# Patient Record
Sex: Female | Born: 1960 | Race: Black or African American | Hispanic: No | Marital: Married
Health system: Midwestern US, Community
[De-identification: ages and names within clinical notes are randomized; demographics above are authoritative.]

## PROBLEM LIST (undated history)

## (undated) DIAGNOSIS — K219 Gastro-esophageal reflux disease without esophagitis: Secondary | ICD-10-CM

## (undated) DIAGNOSIS — E119 Type 2 diabetes mellitus without complications: Secondary | ICD-10-CM

## (undated) DIAGNOSIS — J45909 Unspecified asthma, uncomplicated: Secondary | ICD-10-CM

## (undated) DIAGNOSIS — M1990 Unspecified osteoarthritis, unspecified site: Secondary | ICD-10-CM

## (undated) DIAGNOSIS — E669 Obesity, unspecified: Secondary | ICD-10-CM

## (undated) DIAGNOSIS — E7800 Pure hypercholesterolemia, unspecified: Secondary | ICD-10-CM

## (undated) DIAGNOSIS — J302 Other seasonal allergic rhinitis: Secondary | ICD-10-CM

## (undated) DIAGNOSIS — G4730 Sleep apnea, unspecified: Secondary | ICD-10-CM

## (undated) DIAGNOSIS — I10 Essential (primary) hypertension: Secondary | ICD-10-CM

## (undated) DIAGNOSIS — Z1231 Encounter for screening mammogram for malignant neoplasm of breast: Secondary | ICD-10-CM

## (undated) HISTORY — DX: Pure hypercholesterolemia, unspecified: E78.00

## (undated) HISTORY — DX: Obesity, unspecified: E66.9

## (undated) HISTORY — DX: Unspecified osteoarthritis, unspecified site: M19.90

## (undated) HISTORY — DX: Type 2 diabetes mellitus without complications: E11.9

## (undated) HISTORY — DX: Gastro-esophageal reflux disease without esophagitis: K21.9

## (undated) HISTORY — DX: Unspecified asthma, uncomplicated: J45.909

## (undated) HISTORY — PX: BACK SURGERY: SHX140

## (undated) HISTORY — PX: ABDOMINAL HYSTERECTOMY: SHX81

## (undated) HISTORY — DX: Essential (primary) hypertension: I10

## (undated) HISTORY — PX: COLONOSCOPY: SHX174

---

## 1997-06-25 ENCOUNTER — Ambulatory Visit (HOSPITAL_COMMUNITY): Admission: RE | Admit: 1997-06-25 | Discharge: 1997-06-25 | Payer: Self-pay | Admitting: *Deleted

## 1997-08-05 ENCOUNTER — Ambulatory Visit (HOSPITAL_COMMUNITY): Admission: RE | Admit: 1997-08-05 | Discharge: 1997-08-05 | Payer: Self-pay | Admitting: *Deleted

## 1998-01-17 ENCOUNTER — Emergency Department (HOSPITAL_COMMUNITY): Admission: EM | Admit: 1998-01-17 | Discharge: 1998-01-17 | Payer: Self-pay | Admitting: Emergency Medicine

## 1998-11-26 ENCOUNTER — Encounter: Payer: Self-pay | Admitting: Specialist

## 1998-11-26 ENCOUNTER — Ambulatory Visit (HOSPITAL_COMMUNITY): Admission: RE | Admit: 1998-11-26 | Discharge: 1998-11-26 | Payer: Self-pay | Admitting: Specialist

## 1998-12-17 ENCOUNTER — Ambulatory Visit (HOSPITAL_COMMUNITY): Admission: RE | Admit: 1998-12-17 | Discharge: 1998-12-17 | Payer: Self-pay | Admitting: Specialist

## 1998-12-17 ENCOUNTER — Encounter: Payer: Self-pay | Admitting: Specialist

## 1999-05-25 ENCOUNTER — Other Ambulatory Visit: Admission: RE | Admit: 1999-05-25 | Discharge: 1999-05-25 | Payer: Self-pay | Admitting: Obstetrics and Gynecology

## 1999-06-02 ENCOUNTER — Ambulatory Visit (HOSPITAL_COMMUNITY): Admission: AD | Admit: 1999-06-02 | Discharge: 1999-06-02 | Payer: Self-pay | Admitting: *Deleted

## 2000-07-02 NOTE — Discharge Summary (Signed)
[facility]                       OUTPATIENT CENTER DISCHARGE SUMMARY   NAME:[name], [name]   MR#:  [id number]     BILLING #: [id number]     DOS: 07/02/2000  DOD:07/02/2000   TIME:  5:54 P   cc:   Primary Physician:  Gustavus Bryant, M.D.   TIME OF ADMISSION:   0845 hours.   TIME OF DISCHARGE:  1800 hours.   ADMISSION DIAGNOSES:  Chest Pain, Hypertension   DISCHARGE DIAGNOSES:  Chest Pain, cardiac unlikely;  Hypertension   HISTORY:  A 40 year old woman seen in the Emergency Department with a   couple of weeks of chest pain and a recent diagnosis of hypertension.  The   Emergency Department evaluation was unrevealing and the patient was   assigned to the outpatient center for serial cardiac enzymes and stress   echocardiography.   COURSE IN THE OUTPATIENT CENTER:  Had one episode of chest pain without   unchanged electrocardiogram. Vital signs remained stable and unremarkable.   She was generally pain free.   The patient was assigned to the Outpatient Center for serial cardiac   enzymes and resting and stress echocardiography.  Results of serum   myoglobin at 0 and 3 hours, and serum CPK and MB at 0, 6, and 9 hours gave   no indication of acute myocardial damage.  The patient then underwent   resting and exercise 2-D echocardiography and exercise test under the   supervision of [facility].  Their final impression was of   "No CAD, OK for Discharge."   PHYSICAL EXAMINATION:   LUNGS:  Clear.   CARDIOVASCULAR:   Heart is normal.   ABDOMEN:  Nontender.  The patient is asymptomatic.   DISPOSITION:   The probabilistic nature of cardiac diagnostic testing was   explained.  The patient was counseled to seek further cardiac evaluation   should symptoms worsen or persist without another diagnosis being found.   Urged to follow with private physician for continued evaluation and   treatment.   Electronically Signed By:   Shanna Cisco, M.D. 07/10/2000 01:45    ____________________________   Shanna Cisco, M.D.   dh D:  07/02/2000 T:  07/03/2000  2:57 P   [id number]

## 2000-07-02 NOTE — Procedures (Signed)
[facility]                          STRESS ECHOCARDIOGRAM REPORT   NAME:    [name] [name], [name] [name]                                SS#:        [id number]   65   DOB:     03/16/1961                                   AGE:        39   SEX:     F                                            ROOM#:      [id number]   MR#:     [id number]                                     DATE:       02/11/200   2   REFERRING PHYS:L. SiegelTAPE/INDEX:                   [id number]   PRETEST DATA:   INDICATION:  Chest pain   MEDS TAKEN:  --   MEDS HELD:  Nifedipine, Claritin D   TARGET HEART RATE:  180     85%:  153   RISK FACTORS:  Hypertension   BASELINE ECG:  Normal sinus rhythm; within normal limits   EXERCISE SUPERVISED BY:  Charles Ashby, Jr, M.D.   TEST RESULTS:           BRUCE PROTOCOL     STAGE    SPEED (MPH)   GRADE (%)   TIME (MIN:SEC)      HR       BP   Resting                                                  92     112/82       1          1.7           10           3:00          133     148/80       2          2.5           12           3:00          155     160/100       3          3.4           14           1:00          173     ---/--     Recovery                                Immediate        --     ---/--                                             2:00          107     140/80                                             4:00           99     132/80                                             6:00           92     134/84   REASON FOR STOPPING:  Achieved target HR        TOTAL EXERCISE TIME:  7:00   ACHIEVED HEART RATE:  173 (%  max HR)           EST. METS:  8   HR RESPONSE:  Tachycardic                       PEAK RPP:  8   BP RESPONSE:  Hypertensive                      CHEST PAIN:  None   OBSERVED DYSRHYTHMIAS:  None   ST SEGMENT CHANGES:  J point depression with insignificant rapidly   upsloping ST segments                               WALL MOTION ANALYSIS    LV WALL SEGMENT              PRE-EXERCISE            POST-EXERCISE   Basal Anteroseptal              Normal                Hyperkinesis   Basal Septal                    Normal                Hyperkinesis   Basal Inferoseptal              Normal                Hyperkinesis   Basal Posterior                 Normal                Hyperkinesis   Basal Lateral                   Normal                  Hyperkinesis   Basal Anterior                  Normal                Hyperkinesis   Mid Anteroseptal                Normal                Hyperkinesis   Mid Septal                      Normal                Hyperkinesis   Mid Inferoseptal                Normal                Hyperkinesis   Mid Posterior                   Normal                Hyperkinesis   Mid Lateral                     Normal                Hyperkinesis   Mid Anterior                    Normal                Hyperkinesis   Apical Septal                   Normal                Hyperkinesis   Apical Inferior                 Normal                Hyperkinesis   Apical Lateral                  Normal                Hyperkinesis   Apical Anterior                 Normal                Hyperkinesis   LV  Chamber Size                                        Smaller   ECG INTERPRETATION:  Negative by ECG criteria.   ECHO INTERPRETATION:  Normal.   OVERALL IMPRESSION:  Negative for ischemia.

## 2000-07-02 NOTE — Procedures (Signed)
[facility]                          STRESS ECHOCARDIOGRAM REPORT   NAME:    [name] [name], [name] [name]                                SS#:        [id number]   64   DOB:     1960/08/12                                   AGE:        39   SEX:     F                                            ROOM#:      [id number]   MR#:     [id number]                                     DATE:       02/11/200   2   REFERRING PHYS:L. SiegelTAPE/INDEX:                   [id number]   PRETEST DATA:   INDICATION:  Chest pain   MEDS TAKEN:  --   MEDS HELD:  Nifedipine, Claritin D   TARGET HEART RATE:  180     85%:  153   RISK FACTORS:  Hypertension   BASELINE ECG:  Normal sinus rhythm; within normal limits   EXERCISE SUPERVISED BY:  Marisue Ivan, Montez Hageman, M.D.   TEST RESULTS:           BRUCE PROTOCOL     STAGE    SPEED (MPH)   GRADE (%)   TIME (MIN:SEC)      HR       BP   Resting                                                  92     112/82       1          1.7           10           3:00          133     148/80       2          2.5           12           3:00          155     160/100       3          3.4           14           1:00          173     ---/--  Recovery                                Immediate        --     ---/--                                             2:00          107     140/80                                             4:00           99     132/80                                             6:00           92     134/84   REASON FOR STOPPING:  Achieved target HR        TOTAL EXERCISE TIME:  7:00   ACHIEVED HEART RATE:  173 (%  max HR)           EST. METS:  8   HR RESPONSE:  Tachycardic                       PEAK RPP:  8   BP RESPONSE:  Hypertensive                      CHEST PAIN:  None   OBSERVED DYSRHYTHMIAS:  None   ST SEGMENT CHANGES:  J point depression with insignificant rapidly   upsloping ST segments                               WALL MOTION ANALYSIS    LV WALL SEGMENT              PRE-EXERCISE            POST-EXERCISE   Basal Anteroseptal              Normal                Hyperkinesis   Basal Septal                    Normal                Hyperkinesis   Basal Inferoseptal              Normal                Hyperkinesis   Basal Posterior                 Normal                Hyperkinesis   Basal Lateral                   Normal  Hyperkinesis   Basal Anterior                  Normal                Hyperkinesis   Mid Anteroseptal                Normal                Hyperkinesis   Mid Septal                      Normal                Hyperkinesis   Mid Inferoseptal                Normal                Hyperkinesis   Mid Posterior                   Normal                Hyperkinesis   Mid Lateral                     Normal                Hyperkinesis   Mid Anterior                    Normal                Hyperkinesis   Apical Septal                   Normal                Hyperkinesis   Apical Inferior                 Normal                Hyperkinesis   Apical Lateral                  Normal                Hyperkinesis   Apical Anterior                 Normal                Hyperkinesis   LV  Chamber Size                                        Smaller   ECG INTERPRETATION:  Negative by ECG criteria.   ECHO INTERPRETATION:  Normal.   OVERALL IMPRESSION:  Negative for ischemia.

## 2000-07-02 NOTE — ED Provider Notes (Signed)
[facility]                      EMERGENCY DEPARTMENT TREATMENT REPORT   [facility] ASSIGNMENT   NAME:  [name] [name], [name] [name]   MR #:  [id number]   BILLING #: [id number]        DOS: 07/02/2000  TIME: 6:08 A   cc:   [facility] COPY   Primary Physician:  Pricilla Holm, M.D.   Time Seen:  50.   CHIEF COMPLAINT:   Chest pain.   HISTORY OF PRESENT ILLNESS: This 40 year old female awoke at 0430 today   with 5/10 chest pressure and discomfort which was in the sternal area,   feels like "somebody is pulling on my chest" associated with some nausea   and diaphoresis.  She has been having these symptoms off and on for the   last two weeks and was diagnosed four days ago with hypertension.  Three   days ago, she began nifedipine therapy for her hypertension.  Additionally,   for the last two months, she has had intermittent tingling in her left arm   and fingers which she has again this morning.  She has never had an EKG or   cardiac workup.  There are no aggravating or alleviating factors.  No   shortness of breath.  No other complaints.   REVIEW OF SYSTEMS:   CONSTITUTIONAL:   No fevers, chills, or recent illnesses.   EYES: No visual symptoms.   ENT: No sore throat, runny nose or other URI symptoms.   ENDOCRINE:  No diabetic symptoms.   HEMATOLOGIC/LYMPHATIC:  No excessive bruising or lymph node swelling.   ALLERGIC/IMMUNOLOGIC:  No urticaria or allergy symptoms.   RESPIRATORY:  No cough, shortness of breath, or wheezing.   CARDIOVASCULAR:  No palpitations.   GASTROINTESTINAL:  No vomiting, diarrhea, or abdominal pain.   GENITOURINARY:  No dysuria, frequency, or urgency.   MUSCULOSKELETAL:  No joint pain or swelling.   INTEGUMENTARY:  No rashes.   NEUROLOGICAL:  Dull generalized headache.   PAST MEDICAL HISTORY:   Hypertension, newly diagnosed.   MEDICATIONS:  Nifedipine, Claritin D, Vioxx p.r.n.   ALLERGIES:   Darvocet and penicillin.   SOCIAL HISTORY:  Married.  Stopped smoking one year ago.  Prior to that,    she smoked only a few cigarettes a week.   FAMILY HISTORY:  Both parents have a history of hypertension.  No family   history of early cardiac disease or diabetes.   Cardiac risk factors: No   history of hypertriglyceridemia or diabetes.   PHYSICAL EXAMINATION:   CONSTITUTIONAL:   Alert, 40 year old female.   VITAL SIGNS:  On admission, blood pressure 137/90, pulse 89, respirations   20, temperature 98.7.   EYES: Conjunctiva clear.   ENT:  Mouth/Throat:  Mucous membranes moist.   NECK:  Supple, nontender, symmetrical, no masses or JVD, trachea midline,   thyroid not enlarged, nodular, or tender.   LYMPHATICS:  No cervical or submandibular lymphadenopathy palpated.   RESPIRATORY:  Clear and equal BS.  No respiratory distress, tachypnea, or   accessory muscle use. Chest percussion normal.   CARDIOVASCULAR:  Heart regular, without murmurs, gallops, rubs, or thrills.   PMI not displaced. Aortic pulsation not widened.  No bruits auscultated. DP   pulses 2+ and equal bilaterally. No peripheral edema or significant   varicosities.   CHEST:  Symmetrical.  Sternum is tender to palpation which reproduces her   pain.   GI:  Abdomen soft, nontender, without complaint of pain to palpation.  No   hepatomegaly or splenomegaly. No abdominal or inguinal masses appreciated   by inspection or palpation.   MUSCULOSKELETAL:  Nails:  No clubbing or deformities.  Nailbeds pink with   prompt capillary refill.   SKIN:  Warm and dry without rashes.   NEUROLOGICAL:   No focal deficits.   PSYCHIATRIC:   Oriented to person, place and time.   CONTINUATION BY DR. Henrene Hawking:   IMPRESSION/MANAGEMENT PLAN:  Patient with chest pain.  Acute ischemic   coronary disease must be considered first, and the patient protected   against the consequences of same, while other etiologies (including   infectious, metabolic, pulmonary, GI, and musculoskeletal) are considered.   The patient with recent diagnosis of hypertension.  As an acute illness    posing a potential threat to life or bodily function, this is a high risk   presentation necessitating an immediate diagnostic evaluation.   DIAGNOSTIC TESTING:  BMP is normal.  CBC with WBC of 5.1, hemoglobin 13,   hematocrit of 40, platelets 326,000.  EKG: Sinus rhythm, nonspecific ST   changes.  Chest x-ray negative acute changes by my reading.   COURSE IN THE EMERGENCY DEPARTMENT:  On reevaluation, the patient denies   further chest discomfort, but she has had episodic pain over the past two   weeks.   DISPOSITION:  The initial Emergency Department evaluation of this patient   appears to be negative for evidence of an acute coronary ischemia requiring   hospital admission or urgent intervention.  However, ischemic coronary   disease has not been eliminated as a consideration.  Consequently, the   patient will be assigned to the Outpatient Center for serial cardiac   enzymes and, if these are negative, resting and stress echocardiography or   other additional diagnostic testing.   CONDITION ON DISCHARGE: Stable.   FINAL DIAGNOSIS:   1.  Acute chest pain evaluation.   2.  History of hypertension.   Electronically Signed By:   Haze Justin, M.D. 07/04/2000 21:08   ____________________________   Haze Justin, M.D.   zga/zga  D:  07/02/2000 T:  07/02/2000  6:54 A   [id number]   Dineen Kid, PA

## 2000-07-02 NOTE — Procedures (Signed)
[facility]                          STRESS ECHOCARDIOGRAM REPORT   NAME:    [name] [name], [name] [name]                                SS#:        [id number]   64   DOB:     12/19/1960                                   AGE:        39   SEX:     F                                            ROOM#:      [id number]   MR#:     [id number]                                     DATE:       02/11/200   2   REFERRING PHYS:L. SiegelTAPE/INDEX:                   [id number]   PRETEST DATA:   INDICATION:  Chest pain   MEDS TAKEN:  --   MEDS HELD:  Nifedipine, Claritin D   TARGET HEART RATE:  180     85%:  153   RISK FACTORS:  Hypertension   BASELINE ECG:  Normal sinus rhythm; within normal limits   EXERCISE SUPERVISED BY:  Charles Ashby, Jr, M.D.   TEST RESULTS:           BRUCE PROTOCOL     STAGE    SPEED (MPH)   GRADE (%)   TIME (MIN:SEC)      HR       BP   Resting                                                  92     112/82       1          1.7           10           3:00          133     148/80       2          2.5           12           3:00          155     160/100       3          3.4           14           1:00          173     ---/--     Recovery                                Immediate        --     ---/--                                             2:00          107     140/80                                             4:00           99     132/80                                             6:00           92     134/84   REASON FOR STOPPING:  Achieved target HR        TOTAL EXERCISE TIME:  7:00   ACHIEVED HEART RATE:  173 (%  max HR)           EST. METS:  8   HR RESPONSE:  Tachycardic                       PEAK RPP:  8   BP RESPONSE:  Hypertensive                      CHEST PAIN:  None   OBSERVED DYSRHYTHMIAS:  None   ST SEGMENT CHANGES:  J point depression with insignificant rapidly   upsloping ST segments                               WALL MOTION ANALYSIS   LV WALL SEGMENT              PRE-EXERCISE             POST-EXERCISE   Basal Anteroseptal              Normal                Hyperkinesis   Basal Septal                    Normal                Hyperkinesis   Basal Inferoseptal              Normal                Hyperkinesis   Basal Posterior                 Normal                Hyperkinesis   Basal Lateral                   Normal                  Hyperkinesis   Basal Anterior                  Normal                Hyperkinesis   Mid Anteroseptal                Normal                Hyperkinesis   Mid Septal                      Normal                Hyperkinesis   Mid Inferoseptal                Normal                Hyperkinesis   Mid Posterior                   Normal                Hyperkinesis   Mid Lateral                     Normal                Hyperkinesis   Mid Anterior                    Normal                Hyperkinesis   Apical Septal                   Normal                Hyperkinesis   Apical Inferior                 Normal                Hyperkinesis   Apical Lateral                  Normal                Hyperkinesis   Apical Anterior                 Normal                Hyperkinesis   LV  Chamber Size                                        Smaller   ECG INTERPRETATION:  Negative by ECG criteria.   ECHO INTERPRETATION:  Normal.   OVERALL IMPRESSION:  Negative for ischemia.

## 2000-07-02 NOTE — Procedures (Signed)
[facility]                          STRESS ECHOCARDIOGRAM REPORT   NAME:    [name] [name], [name] [name]                                SS#:        [id number]   64   DOB:     [birth year]                                   AGE:        39   SEX:     F                                            ROOM#:      [id number]   MR#:     [id number]                                     DATE:       02/11/200   2   REFERRING PHYS:L. SiegelTAPE/INDEX:                   [id number]   PRETEST DATA:   INDICATION:  Chest pain   MEDS TAKEN:  --   MEDS HELD:  Nifedipine, Claritin D   TARGET HEART RATE:  180     85%:  153   RISK FACTORS:  Hypertension   BASELINE ECG:  Normal sinus rhythm; within normal limits   EXERCISE SUPERVISED BY:  Marisue Ivan, Montez Hageman, M.D.   TEST RESULTS:           BRUCE PROTOCOL     STAGE    SPEED (MPH)   GRADE (%)   TIME (MIN:SEC)      HR       BP   Resting                                                  92     112/82       1          1.7           10           3:00          133     148/80       2          2.5           12           3:00          155     160/100       3          3.4           14           1:00          173     ---/--  Recovery                                Immediate        --     ---/--                                             2:00          107     140/80                                             4:00           99     132/80                                             6:00           92     134/84   REASON FOR STOPPING:  Achieved target HR        TOTAL EXERCISE TIME:  7:00   ACHIEVED HEART RATE:  173 (%  max HR)           EST. METS:  8   HR RESPONSE:  Tachycardic                       PEAK RPP:  8   BP RESPONSE:  Hypertensive                      CHEST PAIN:  None   OBSERVED DYSRHYTHMIAS:  None   ST SEGMENT CHANGES:  J point depression with insignificant rapidly   upsloping ST segments                               WALL MOTION ANALYSIS   LV WALL SEGMENT              PRE-EXERCISE             POST-EXERCISE   Basal Anteroseptal              Normal                Hyperkinesis   Basal Septal                    Normal                Hyperkinesis   Basal Inferoseptal              Normal                Hyperkinesis   Basal Posterior                 Normal                Hyperkinesis   Basal Lateral                   Normal  Hyperkinesis   Basal Anterior                  Normal                Hyperkinesis   Mid Anteroseptal                Normal                Hyperkinesis   Mid Septal                      Normal                Hyperkinesis   Mid Inferoseptal                Normal                Hyperkinesis   Mid Posterior                   Normal                Hyperkinesis   Mid Lateral                     Normal                Hyperkinesis   Mid Anterior                    Normal                Hyperkinesis   Apical Septal                   Normal                Hyperkinesis   Apical Inferior                 Normal                Hyperkinesis   Apical Lateral                  Normal                Hyperkinesis   Apical Anterior                 Normal                Hyperkinesis   LV  Chamber Size                                        Smaller   ECG INTERPRETATION:  Negative by ECG criteria.   ECHO INTERPRETATION:  Normal.   OVERALL IMPRESSION:  Negative for ischemia.

## 2001-08-22 NOTE — Progress Notes (Signed)
[facility]                       PHYSICAL THERAPY INITIAL EVALUATION   PATIENT NAME:  [name] [name], [name] [name]   MR#:  [id number]   DATE:  08/21/2001   REFERRING PHYSICIAN:  Blenda Bridegroom, M.D.   DIAGNOSIS:  Low back pain with lumbar spasms.   PRESCRIPTION: Evaluate and treat.   SUBJECTIVE:   HISTORY OF PRESENT ILLNESS: The patient is a 41 year old female referred to   [facility] outpatient physical therapy department with   complaints of low back pain and spasms right side greater than left.  The   patient reports that she fell in October 2002 and has had intermittent back   pain since that time. She also reports falling in January of this year   causing the pain and the spasms to become more consistent.  She reports   that she is able to walk, but with prolonged walking and certain physical   activities spasms are reproduced. She states that heat and ice tend to help   decrease the pain. She denies any lower extremity radicular symptoms.  She   is to follow up with Dr. Emmit Alexanders as needed.   PAIN SCALE RATING: Patient currently rates his pain 2-3/10 on a pain   assessment scale.   MEDICATIONS: Toprol and hydrochlorothiazide.   ALLERGIES:  None were reported.   PRECAUTIONS:  None were stated on the prescription.   PAST MEDICAL HISTORY: The patient has a history of high blood pressure and   a lumbar discectomy in 1997.   X-RAYS/TESTS: X-rays were done of the lumbar spine which she is unsure of   the results.   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   PREVIOUS PHYSICAL THERAPY: The patient did undergo physical therapy prior   to her surgery in 1997.   PATIENT'S GOALS: To increase her strength to allow her to increase her   activity level.   OBJECTIVE:   RANGE OF MOTION: The patient is able to demonstrate full trunk flexion and    extension. Side bending to the left and right was limited by approximately   25%.  Restrictions were noted at L3 with side bending.   STRENGTH:  Trunk musculature was grossly 4/5.  Bilateral lower extremities   5/5.   GIRTH:  No lower extremity edema was noted.   PALPATION:  Tenderness present through the lumbar spinal musculature rated   1/4 on Reeves tender point index.   POSTURE: No deviations were observed.   SENSATION:  Intact to light touch distal lower extremity dermatomes.   GAIT:  No deviations were observed.   SPECIAL TESTS: None were performed.   TODAY'S TREATMENT: Evaluation followed by therapeutic exercise and   initiation of a home exercise program.   ASSESSMENT:   The patient is a 41 year old female with low back pain slightly limiting   range of motion and functional activities.   GOALS:  To be achieved in 8 visits.   1.    Decrease low back pain to 0/10 on a pain assessment scale at rest and         with activity.   2.    The patient is able to increase her activity level and exercise         tolerance without resulting lumbar spasms.   3.  The patient is able to demonstrate full, pain free trunk active range         of motion with complete segmental mobility.   4.    Absent palpable tenderness throughout the lumbar spinal musculature.   5.    The patient is to be independent with her home exercise program.   6.    The patient is able to increase her trunk and abdominal strength         approximately 1 manual muscle test grade to allow her to perform her         exercises and activities without limitations.   REHAB POTENTIAL: Excellent to meet the above stated goals.   BARRIERS TO ACHIEVING GOALS: None.   PLAN:   Physical therapy 2 times a week for 4 weeks to include the following   treatment of lumbar and abdominal stabilization exercises, moist heat or   ice and e-stim as needed, teaching of a home exercise program as well as   postural and body mechanics training.    Thank you Dr. Emmit Alexanders for this referral.   [facility], MSPT   sl  D:  08/22/2001  T:  08/26/2001  8:33 A   [id number]   cc:

## 2006-11-27 DIAGNOSIS — N6019 Diffuse cystic mastopathy of unspecified breast: Secondary | ICD-10-CM | POA: Insufficient documentation

## 2006-11-27 DIAGNOSIS — R1319 Other dysphagia: Secondary | ICD-10-CM | POA: Insufficient documentation

## 2007-08-26 DIAGNOSIS — R7301 Impaired fasting glucose: Secondary | ICD-10-CM | POA: Insufficient documentation

## 2007-11-11 NOTE — Procedures (Signed)
Test Reason : pre op   Blood Pressure : ***/*** mmHG   Vent. Rate : 083 BPM     Atrial Rate : 083 BPM   P-R Int : 140 ms          QRS Dur : 066 ms   QT Int : 382 ms       P-R-T Axes : -82 047 026 degrees   QTc Int : 448 ms   Unusual P axis, possible ectopic atrial rhythm   Poor R Wave Progression   Abnormal ECG   When compared with ECG of 02-Jul-2000 11:45,   PREVIOUS ECG IS PRESENT No significant change was found   Confirmed by Manchi, M.D., Partha ([id number]) on 11/11/2007 2:14:12 PM   Referred By:  BLOWE, VANESSA           Overread By: Partha Manchi, M.D.

## 2007-11-11 NOTE — Procedures (Signed)
Test Reason : pre op   Blood Pressure : ***/*** mmHG   Vent. Rate : 083 BPM     Atrial Rate : 083 BPM   P-R Int : 140 ms          QRS Dur : 066 ms   QT Int : 382 ms       P-R-T Axes : -82 047 026 degrees   QTc Int : 448 ms   Unusual P axis, possible ectopic atrial rhythm   Poor R Wave Progression   Abnormal ECG   When compared with ECG of 02-Jul-2000 11:45,   PREVIOUS ECG IS PRESENT No significant change was found   Confirmed by Rainey Pines, M.D., Partha ([id number]) on 11/11/2007 2:14:12 PM   Referred By:  Arlester Marker           Overread By: Lisabeth Devoid, M.D.

## 2007-11-15 NOTE — H&P (Signed)
[facility]                              HISTORY AND PHYSICAL                         LINDA J. MATHISON-EZIEME, M.D.   [name] [name], [name] [name]   MR #:    [id number]                  ADM DATE:          11/18/2007   BILLING  [id number]                 PT. LOCATION   #:   SS #     [id number]               DOB:  [birth year]   AGE:  46   LINDA J. MATHISON-EZIEME, M.D.     SEX:  F   cc:    LINDA J. MATHISON-EZIEME, M.D.   HISTORY OF THE PRESENT ILLNESS   This patient is a 47 year old G2, P0-0-2-0, not on any form of   contraception, who was first seen in January 2009 for an annual exam.  At   that time, she complained of an area of irritation on her vulva.  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   The patient returned for a colposcopic examination in February 2009, and   the biopsy result revealed VIN2.   OBSTETRICAL HISTORY   One elective termination of pregnancy and one miscarriage.   GYNECOLOGIC HISTORY   No abnormal Pap test.  The patient has genital herpes.  Menarche was at age   77.   PAST MEDICAL HISTORY   1. Hypertension.   2. Migraine headaches.   3. Hypothyroidism.   PAST SURGICAL HISTORY   Back surgery in July 1997.   MEDICATIONS   1. Toprol.   2. Hydrochlorothiazide.   3. Potassium supplement.   4. Enbrel.   5. Acyclovir   ALLERGIES:   Darvocet and penicillin.   SOCIAL HISTORY   No alcohol, tobacco or drug use.   FAMILY HISTORY   No uterine, ovarian, breast or colon cancer.   PHYSICAL EXAMINATION   VITAL SIGNS:  Blood pressure 122/86.   GENERAL APPEARANCE:  Normal.   SKIN:  No lesions.   NECK:  Supple.  Thyroid not enlarged.   LUNGS:  Clear.   HEART:  Regular rate and rhythm.  Normal S1-S2.   BREASTS:  Soft, nontender.  No masses.   PELVIC AND RECTAL:  External genitalia normal.  Vagina with old blood.   Cervix nontender.  Uterus not palpable.  Perianal lesion.  Anus with no   lesions.   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   PLAN   The patient will undergo laser ablation of the vulva.  Risks of the   procedure including infection and damage to adjacent structures have been   discussed with the patient.   Electronically Signed By:   Carlena Hurl. MATHISON-EZIEME, M.D. 11/19/2007 22:50   ____________________________   Carlena Hurl. MATHISON-EZIEME, M.D.   [name]  D:  11/15/2007  T:  11/15/2007  2:32 P   [id number]

## 2007-11-18 NOTE — Op Note (Signed)
[facility]                                OPERATION REPORT                    SURGEON:  LINDA J. MATHISON-EZIEME, M.D.   NAM [name] [name], [name]   E:   MR  [id number]                DATE OF SURGERY:                     11/18/2007   #:   SS  [id number]             PT. LOCATION:                        OR  OR30   #   LINDA J. MATHISON-EZIEME,    DOB: 08/03/1960        AGE:46        SEX:  F   M.D.   cc:    LINDA J. MATHISON-EZIEME, M.D.   PREOPERATIVE DIAGNOSIS   Vulvar intraepithelial neoplasia II.   POSTOPERATIVE DIAGNOSIS   Vulvar intraepithelial neoplasia II.   OPERATION PERFORMED   Laser ablation to the vulva.   FINDINGS   Quarter-size area of acetyl white epithelium   SURGEON   Dr. Mathison-Ezieme   ASSISTANT   SA   ANESTHESIA   General   SPECIMENS REMOVED   None   ESTIMATED BLOOD LOSS   Minimal   COMPLICATIONS   None   PROCEDURE   The patient was taken to the operating room, placed in the supine position.   General anesthesia was then administered.  She was then placed in the   dorsal lithotomy position in candy-cane stirrups, prepped for laser   surgery.   Acetic acid was placed on the perineum and in the introitus.  A very large   area of white epithelium was noted.  The area involved was ablated under a   power of 15 with the laser device including the margins and just inside the   vagina to completely ablate the abnormal cells.  Some bleeding was noted   and this was abated with the electrosurgical unit.  Silvadene was then   applied.  The patient tolerated the procedure well.  Sponge, lap and needle   counts correct x2.  She was taken to the PACU in stable condition.   Electronically Signed By:   LINDA J. MATHISON-EZIEME, M.D. 11/19/2007 22:50   ____________________________   LINDA J. MATHISON-EZIEME, M.D.   AK  D:  11/18/2007  T:  11/18/2007  9:08 P  [id number]

## 2007-11-18 NOTE — Op Note (Signed)
[facility]                                OPERATION REPORT                    SURGEON:  LINDA J. [name], M.D.   [facility], [place]   E:   MR  [id number]                DATE OF SURGERY:                     11/18/2007   #:   [name] [name]  [id number]             PT. LOCATION:                        OR  OR30   #   LINDA J. [name],    DOB: 1961-05-09        AGE:47        SEX:  F   M.D.   cc:    LINDA J. [name], M.D.   PREOPERATIVE DIAGNOSIS   Vulvar intraepithelial neoplasia II.   POSTOPERATIVE DIAGNOSIS   Vulvar intraepithelial neoplasia II.   OPERATION PERFORMED   Laser ablation to the vulva.   FINDINGS   Quarter-size area of acetyl white epithelium   SURGEON   Dr. Casper Harrison   ASSISTANT   SA   ANESTHESIA   General   SPECIMENS REMOVED   None   ESTIMATED BLOOD LOSS   Minimal   COMPLICATIONS   None   PROCEDURE   The patient was taken to the operating room, placed in the supine position.   General anesthesia was then administered.  She was then placed in the   dorsal lithotomy position in candy-cane stirrups, prepped for laser   surgery.   Acetic acid was placed on the perineum and in the introitus.  A very large   area of white epithelium was noted.  The area involved was ablated under a   power of 15 with the laser device including the margins and just inside the   vagina to completely ablate the abnormal cells.  Some bleeding was noted   and this was abated with the electrosurgical unit.  Silvadene was then   applied.  The patient tolerated the procedure well.  Sponge, lap and needle   counts correct x2.  She was taken to the PACU in stable condition.   Electronically Signed By:   [name] [name], M.D. 11/19/2007 22:50   ____________________________   [name] [name], M.D.   AK  D:  11/18/2007  T:  11/18/2007  9:08 P  [id number]

## 2008-07-06 NOTE — Procedures (Signed)
Test Reason : PRE OP   Blood Pressure : ***/*** mmHG   Vent. Rate : 083 BPM     Atrial Rate : 083 BPM   P-R Int : 184 ms          QRS Dur : 062 ms   QT Int : 378 ms       P-R-T Axes : -79 065 029 degrees   QTc Int : 444 ms   Unusual P axis, possible ectopic atrial rhythm   Early repolarization   When compared with ECG of 11-Nov-2007 12:16,   No significant change was found   Confirmed by Shah, M.D., Sanjay ([id number]) on 07/07/2008 5:13:37 PM   Referred By:  BLOWE, VANESSA           Overread By: Sanjay Shah, M.D.

## 2008-07-06 NOTE — H&P (Signed)
[facility]                              HISTORY AND PHYSICAL                             [place] W. Garlan Fair, M.D.   NAMEKRISTALYN, BERGSTRESSER   MR #:    [id number]                  ADM DATE:          07/10/2008   BILLING  [id number]                 PT. LOCATION   #:   SS #     [id number]               DOB:  Sep 29, 1960   AGE:  47   THOMAS W. MONTAG, M.D.             SEX:  F   cc:    LINDA J. MATHISON-EZIEME, M.D.          Acie Fredrickson. MONTAG, M.D.   DATE OF SURGERY   07/10/2008   CHIEF COMPLAINT   Recurrent vulvar intraepithelial neoplasia II.   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   This was initially diagnosed with VIN II on vulvar biopsy in February 2009.   On 11/18/2007 she underwent vulvar laser ablation.  New lesions on the   vulva were noted during a recent office visit.  She complains of mild   pruritus and burning.  She is otherwise asymptomatic.   PAST MEDICAL HISTORY   Significant for hypertension, migraine headaches, psoriasis and psoriatic   arthritis.   PAST SURGICAL HISTORY   Disk surgery 1997.   CURRENT MEDICATIONS   1. Toprol XL 25 mg daily.   2. Hydrochlorothiazide 25 mg daily.   3. Kay Ciel daily.   4. Singulair daily.   5. Claritin daily.   6. Premphase daily.   7. Nasonex.   8. Ambien.   9. Tramadol 50 mg q.i.d.   10. Medrol.   ALLERGIES   Penicillin and Darvocet, reacting to both with a rash.   FAMILY HISTORY   Significant for cardiovascular disease, diabetes and hypertension.  Her   brother had a brain tumor.   SOCIAL HISTORY   The patient is married and lives in [place].  She works as a Personnel officer with bipolar patients.   REVIEW OF SYSTEMS   Noncontributory.   PHYSICAL EXAMINATION   HEENT:  Exam grossly normal.   NECK:  Supple with a normal thyroid.    LYMPH NODES:  No supraclavicular, axillary or inguinal adenopathy.   SKIN:  Without obvious lesions.   CHEST:  Clear to auscultation.   CARDIOVASCULAR EXAM:  Normal sinus rhythm.   ABDOMEN:  Without obvious masses or tenderness.   PELVIC EXAM:  Shows a discolored raised area in the posterior   fourchette/perineal area measuring approximately 2 x 3 cm.   EXTREMITIES:  No edema.   NEUROLOGIC EXAMINATION:  No focal defects.   ASSESSMENT   Recurrent vulvar intraepithelial neoplasia II.   PLAN   Wide local excision will be performed under  general anesthesia.  The   indications for surgery as well as alternatives were discussed at length   preoperatively.  In addition, the potential risks and complications of   surgery were discussed.  She and her husband understand these to include   the possibility of bleeding, infection, blood clots, damage to normal   organs including the rectum, medical or anesthesia complications, wound   complications (including wound separation) and even death.  She   acknowledges the risks and agrees to proceed with surgery.   Electronically Signed By:   Acie Fredrickson. Garlan Fair, M.D. 07/24/2008 15:21   ____________________________   Acie Fredrickson. Garlan Fair, M.D.   Georgena Spurling  D:  07/09/2008  T:  07/09/2008  1:19 P   [id number]

## 2008-07-06 NOTE — Procedures (Signed)
Test Reason : PRE OP   Blood Pressure : ***/*** mmHG   Vent. Rate : 083 BPM     Atrial Rate : 083 BPM   P-R Int : 184 ms          QRS Dur : 062 ms   QT Int : 378 ms       P-R-T Axes : -79 065 029 degrees   QTc Int : 444 ms   Unusual P axis, possible ectopic atrial rhythm   Early repolarization   When compared with ECG of 11-Nov-2007 12:16,   No significant change was found   Confirmed by Sherryll Burger, M.D., Sanjay (12) on 07/07/2008 5:13:37 PM   Referred By:  Arlester Marker           Overread By: Delorise Shiner, M.D.

## 2008-07-10 NOTE — Op Note (Signed)
[facility]                                OPERATION REPORT                        SURGEON:  THOMAS W. MONTAG, M.D.   [name], [name]   E:   MR  [id number]                DATE OF SURGERY:                     07/10/2008   #:   SS  [id number]             PT. LOCATION:                        OR  OR10   #   THOMAS W. MONTAG, M.D.       DOB: 04/04/1961        AGE:47        SEX:  F   cc:    THOMAS W. MONTAG, M.D.   PREOPERATIVE DIAGNOSIS   Recurrent vulvar intraepithelial neoplasia II.   POSTOPERATIVE DIAGNOSIS   Recurrent vulvar intraepithelial neoplasia II.   OPERATION PERFORMED   Wide local excision of vulvar/perineal lesion.   SURGEON   Dr. Montag   ANESTHESIA   General   OPERATIVE FINDINGS   3 x 2-cm perineal lesion   DESCRIPTION OF PROCEDURE   After adequate general anesthesia the patient was placed in the dorsal   lithotomy position.  The perineum was aseptically prepped and draped in the   standard fashion.   A horizontal elliptical incision was made surrounding the 3 x 2-cm perineal   lesion.  A margin of approximately 1 cm of normal skin surrounded the   lesion.  The incision was made with the scalpel.  The left margin of the   specimen was grasped with an Allis clamp.  The specimen was then removed   using cautery.  Cautery was used to achieve hemostasis.   The defect was closed in layers using 2-0 Vicryl interrupted sutures.  The   skin was then closed with 2-0 Vicryl interrupted sutures.  Hemostasis was   adequate.  The procedure was then terminated.   The patient tolerated the procedure well and returned to the recovery room   in excellent condition.   ESTIMATED BLOOD OSS   Minimal   COMPLICATIONS   None   SPONGE, NEEDLE AND INSTRUMENT COUNTS   Correct x3 per the nurses   Electronically Signed By:   THOMAS W. MONTAG, M.D. 07/24/2008 15:21   ____________________________   THOMAS W. MONTAG, M.D.   SC  D:  07/10/2008  T:  07/10/2008 12:42 P  [id number]

## 2008-07-10 NOTE — Op Note (Signed)
[facility]                                OPERATION REPORT                        SURGEON:  Acie Fredrickson. Garlan Fair, M.D.   [facility] [place], Cherlyn   E:   MR  [id number]                DATE OF SURGERY:                     07/10/2008   #:   Lindley Magnus  [id number]             PT. LOCATION:                        OR  OR10   #   Acie Fredrickson. Garlan Fair, M.D.       DOB: [birth year]        AGE:48        SEX:  F   cc:    Acie Fredrickson. MONTAG, M.D.   PREOPERATIVE DIAGNOSIS   Recurrent vulvar intraepithelial neoplasia II.   POSTOPERATIVE DIAGNOSIS   Recurrent vulvar intraepithelial neoplasia II.   OPERATION PERFORMED   Wide local excision of vulvar/perineal lesion.   SURGEON   Dr. Garlan Fair   ANESTHESIA   General   OPERATIVE FINDINGS   3 x 2-cm perineal lesion   DESCRIPTION OF PROCEDURE   After adequate general anesthesia the patient was placed in the dorsal   lithotomy position.  The perineum was aseptically prepped and draped in the   standard fashion.   A horizontal elliptical incision was made surrounding the 3 x 2-cm perineal   lesion.  A margin of approximately 1 cm of normal skin surrounded the   lesion.  The incision was made with the scalpel.  The left margin of the   specimen was grasped with an Allis clamp.  The specimen was then removed   using cautery.  Cautery was used to achieve hemostasis.   The defect was closed in layers using 2-0 Vicryl interrupted sutures.  The   skin was then closed with 2-0 Vicryl interrupted sutures.  Hemostasis was   adequate.  The procedure was then terminated.   The patient tolerated the procedure well and returned to the recovery room   in excellent condition.   ESTIMATED BLOOD OSS   Minimal   COMPLICATIONS   None   SPONGE, NEEDLE AND INSTRUMENT COUNTS   Correct x3 per the nurses   Electronically Signed By:   Acie Fredrickson. Garlan Fair, M.D. 07/24/2008 15:21   ____________________________   Acie Fredrickson. Garlan Fair, M.D.   [facility]  D:  07/10/2008  T:  07/10/2008 12:42 P  [id number]

## 2012-01-16 DIAGNOSIS — M6530 Trigger finger, unspecified finger: Secondary | ICD-10-CM | POA: Insufficient documentation

## 2013-01-16 LAB — METABOLIC PANEL, BASIC
BUN: 14 mg/dl (ref 7–25)
CO2: 27 mEq/L (ref 21–32)
Calcium: 9.3 mg/dl (ref 8.5–10.1)
Chloride: 105 mEq/L (ref 98–107)
Creatinine: 0.7 mg/dl (ref 0.6–1.3)
GFR est AA: >60
GFR est non-AA: >60
Glucose: 77 mg/dl (ref 74–106)
Potassium: 3.8 mEq/L (ref 3.5–5.1)
Sodium: 140 mEq/L (ref 136–145)

## 2013-01-16 LAB — HCG QL SERUM: HCG, Ql.: NEGATIVE

## 2013-01-20 NOTE — H&P (Signed)
[facility]  Stat History and Physical  NAME:  [name], [name]  SEX:   F  ADMIT: 01/23/2013  DOB:[birth year]  MR#    [id number]  ROOM:    ACCT#  [id number]    I hereby certify this patient for admission based upon medical necessity as   noted below:    <    HISTORY OF PRESENT ILLNESS:  The patient is a 52 year old G2, P0-0-2-0 with menorrhagia.    The patient reports that even at the age of 52 still having very heavy   bleeding which results in accidents at times.  She desires endometrial   ablation.    PAST MEDICAL HISTORY:  Psoriatic arthritis, hypertension.    PAST SURGICAL HISTORY:  Back surgery in 1997, laser surgery to the vulva in 2010.    MEDICATIONS:  Ambien, losartan, metoprolol, Premphase, Singulair, tramadol.    ALLERGIES:  PENICILLIN CAUSE A RASH.    REPRODUCTIVE HISTORY:  Menarche was at age 40.  Flow is heavy.  She has had 2 pregnancies, one   resulted in a miscarriage and the other one was electively terminated.    FAMILY HISTORY:  There is no family history of breast, ovarian, colon  or uterine cancer.    SOCIAL HISTORY:  She is a social drinker.  She does not use tobacco.  She is an Educational psychologist at [employer].  She is married.    PHYSICAL EXAMINATION:  VITAL SIGNS:  Blood pressure 128/86.  GENERAL APPEARANCE:  Normal.  SKIN:  No rashes.  NECK:  Supple.  Thyroid not enlarged.  LUNGS:  Clear.  HEART:  Regular rate and rhythm, normal S1 and S2.  ABDOMEN:  Soft and obese.  PELVIC:  Vulva is normal.  Vagina pink, moist folds. Cervix no lesions.    Uterus nontender.  Adnexa no masses.    LABORATORY DATA:  Ultrasound on 11/26/12 revealed an anteverted uterus measuring 10 x 5 x 6 cm.    There were 3 small fibroids each around 2 cm and they all were in the   intramural location.    Endometrial biopsy on 12/13/12 revealed secretory endometrium without   hyperplasia or carcinoma.    ASSESSMENT:  This is a 52 year old woman woman with continued menorrhagia.    PLAN:   She will undergo a NovaSure endometrial ablation with hysteroscopy.      ___________________  Carlena Hurl Mathison-Ezieme MD  Dictated By: .   Edmonia Caprio  D:01/20/2013 21:24:12  T: 01/20/2013 21:36:18  [id number]  Authenticated by Jodelle Green, M.D. On 01/23/2013 12:36:07 PM

## 2013-01-23 NOTE — Op Note (Signed)
[facility]  Operation Report  NAME:  [name] [name], [name] [name]  SEX:   F  DATE: 01/23/2013  DOB: 11/03/1960  MR#    [id number]  ROOM:  OR06  ACCT#  [id number]        PREOPERATIVE DIAGNOSIS:   Menorrhagia.     POSTOPERATIVE DIAGNOSIS:   Menorrhagia.    PROCEDURES PERFORMED:  1.  NovaSure endometrial ablation.  2.  Diagnostic hysteroscopy.    FINDINGS:  Normal appearing endometrium.    SURGEON:  Luva Metzger Mathison-Ezieme, MD    ASSISTANT:  SA.    ANESTHESIA:  General.    SPECIMENS REMOVED:  None.    ESTIMATED BLOOD LOSS:  Minimal.    COMPLICATIONS:  None.    DESCRIPTION OF PROCEDURE:  The patient was taken to the operating room and placed in the supine position.    General anesthesia was then administered.  She was then placed in the dorsal   lithotomy position in candy-cane stirrups, prepped and draped in the usual   sterile manner.    A speculum was placed into the vagina and the anterior lip of the cervix   grasped with a single-tooth tenaculum.  The cervix was serially dilated to   admit passage of a 4 mm hysteroscope.  The uterine cavity was distended and   appeared normal.  The hysteroscope was then removed.  The uterine cavity   length was then found to be 5.5 cm.  The NovaSure device was placed and   seated, and the cavity width found to be 3.6 cm.    With a wattage of 109 over a period of 60 seconds, the endometrial cavity was   ablated.  The endometrial ablation device was then removed.  The tenaculum was   removed and hemostasis assured.  The patient tolerated the procedure well.    Lap and needle count was correct.  She was taken to the PACU in stable   condition.      ___________________  Vega Stare J Mathison-Ezieme MD  Dictated By:.   NT  D:01/23/2013 08:38:03  T: 01/23/2013 08:50:33  [id number]  Authenticated by Bear Osten J. Mathison Ezieme, M.D. On 01/23/2013 12:36:12 PM

## 2013-01-23 NOTE — Op Note (Signed)
[facility]  Operation Report  NAME:  Thal, Nashea  SEX:   F  DATE: 01/23/2013  DOB: 31-Jul-1960  MR#    [id number]  ROOM:  IO96  ACCT#  [id number]        PREOPERATIVE DIAGNOSIS:   Menorrhagia.     POSTOPERATIVE DIAGNOSIS:   Menorrhagia.    PROCEDURES PERFORMED:  1.  NovaSure endometrial ablation.  2.  Diagnostic hysteroscopy.    FINDINGS:  Normal appearing endometrium.    SURGEON:  Priscille Heidelberg, MD    ASSISTANT:  SA.    ANESTHESIA:  General.    SPECIMENS REMOVED:  None.    ESTIMATED BLOOD LOSS:  Minimal.    COMPLICATIONS:  None.    DESCRIPTION OF PROCEDURE:  The patient was taken to the operating room and placed in the supine position.    General anesthesia was then administered.  She was then placed in the dorsal   lithotomy position in candy-cane stirrups, prepped and draped in the usual   sterile manner.    A speculum was placed into the vagina and the anterior lip of the cervix   grasped with a single-tooth tenaculum.  The cervix was serially dilated to   admit passage of a 4 mm hysteroscope.  The uterine cavity was distended and   appeared normal.  The hysteroscope was then removed.  The uterine cavity   length was then found to be 5.5 cm.  The NovaSure device was placed and   seated, and the cavity width found to be 3.6 cm.    With a wattage of 109 over a period of 60 seconds, the endometrial cavity was   ablated.  The endometrial ablation device was then removed.  The tenaculum was   removed and hemostasis assured.  The patient tolerated the procedure well.    Lap and needle count was correct.  She was taken to the PACU in stable   condition.      ___________________  Carlena Hurl Mathison-Ezieme MD  Dictated By:.   NT  D:01/23/2013 08:38:03  T: 01/23/2013 08:50:33  [id number]  Authenticated by Jodelle Green, M.D. On 01/23/2013 12:36:12 PM

## 2014-02-17 DIAGNOSIS — G4700 Insomnia, unspecified: Secondary | ICD-10-CM | POA: Insufficient documentation

## 2014-05-20 DIAGNOSIS — M5020 Other cervical disc displacement, unspecified cervical region: Secondary | ICD-10-CM | POA: Insufficient documentation

## 2014-05-27 LAB — METABOLIC PANEL, BASIC
BUN: 13 mg/dl (ref 7–25)
CO2: 27 mEq/L (ref 21–32)
Calcium: 8.9 mg/dl (ref 8.5–10.1)
Chloride: 104 mEq/L (ref 98–107)
Creatinine: 0.9 mg/dl (ref 0.6–1.3)
GFR est AA: >60
GFR est non-AA: >60
Glucose: 73 mg/dl — ABNORMAL LOW (ref 74–106)
Potassium: 4 mEq/L (ref 3.5–5.1)
Sodium: 139 mEq/L (ref 136–145)

## 2014-05-27 LAB — CBC WITH AUTOMATED DIFF
BASOPHILS: 0.4 % (ref 0–3)
EOSINOPHILS: 0.9 % (ref 0–5)
HCT: 41 % (ref 37.0–50.0)
HGB: 13.3 gm/dl (ref 13.0–17.2)
IMMATURE GRANULOCYTES: 0.1 % (ref 0.0–3.0)
LYMPHOCYTES: 44.9 % (ref 28–48)
MCH: 29.4 pg (ref 25.4–34.6)
MCHC: 32.4 gm/dl (ref 30.0–36.0)
MCV: 90.7 fL (ref 80.0–98.0)
MONOCYTES: 9.3 % (ref 1–13)
MPV: 10.3 fL — ABNORMAL HIGH (ref 6.0–10.0)
NEUTROPHILS: 44.4 % (ref 34–64)
NRBC: 0 (ref 0–0)
PLATELET: 297 10*3/uL (ref 140–450)
RBC: 4.52 M/uL (ref 3.60–5.20)
RDW-SD: 42.7 (ref 36.4–46.3)
WBC: 8 10*3/uL (ref 4.0–11.0)

## 2014-05-27 LAB — HCG QL SERUM: HCG, Ql.: NEGATIVE

## 2014-05-31 NOTE — H&P (Addendum)
[facility]  History [facility] Physical  NAME:  [name], [name]  SEX:   F  ADMIT: 06/03/2014  DOB:[birth year]  MR#    [id number]  ROOM:    ACCT#  [id number]    I hereby certify this patient for admission based upon medical necessity as  noted below:    <    Preop H&P    HISTORY OF PRESENT ILLNESS:  The patient is a 54 year old G2, P0-0-2-0 with abnormal uterine bleeding on  hormonal therapy with a thickened endometrium despite previous endometrial  ablation.    The patient was seen in 07/2011 for evaluation of irregular bleeding on  hormone replacement therapy.  She subsequently underwent an endometrial  biopsy.  The report returned that no diagnostic endometrial tissue was  present.  This is likely due to her previous endometrial ablation.  This  finding was discussed with the patient and she was given the option of a D&C  to try and retrieve more tissue, stop the hormone replacement therapy for a  while and observe, try different hormone replacement therapy formulation or, a  hysterectomy for definitive diagnosis.  The patient now desires definitive  treatment/diagnosis with hysterectomy.  She wants this to allow her to  continue hormone without having to worry about endometrial carcinoma.    PAST MEDICAL HISTORY:  Psoriatic arthritis, hypertension, VIN-2.       PAST SURGICAL HISTORY:  Back surgery in 1997, laser to the vulva in 2010 for treatment of VIN2,  NovaSure endometrial ablation in 2014.    MEDICATIONS:    Ambien, tramadol, omeprazole, montelukast, Zyrtec, Benadryl, losartan,  potassium, metoprolol, probiotic, Premarin and Prempro.    ALLERGIES:  PENICILLIN.    SOCIAL HISTORY:  She is a social drinker.  She works as an Airline pilotemployment specialist.  She is  married.  She does not use tobacco.    FAMILY HISTORY:  Both parents and a brother and sister have hypertension.  Her father has heart  disease.  Her grandmother had diabetes.    OB HISTORY:  She has had 2 pregnancies, 1 miscarriage and the other was ended  electively.    GYN HISTORY:  No abnormal Paps or STDs.  Her last Pap test was in 2013 and KOH there was  negative.    PHYSICAL EXAMINATION:  VITAL SIGNS:  Blood pressure 114/78, weight 132 pounds.  GENERAL APPEARANCE:  Normal.  SKIN:  No rashes.  NECK:  Supple.  Thyroid not enlarged.  LUNGS:  Clear.  HEART:  Regular rate and rhythm, normal S1 and S2.  ABDOMEN:  Soft, obese.  PELVIC:  Vulva and vagina are normal.  Uterus not palpable.  Adnexa no masses.    LABORATORY DATA:  Ultrasound on 08/14/13 revealed an anteverted uterus measuring 7.6 x 4.5 x 5.2  cm.  There were 3 small intramural fibroids ranging from 1.6 cm to 2.4 cm.  The endometrium appeared normal with a thickness of 8 mm.  Both ovaries  appeared normal.    ASSESSMENT:  This is a 54 year old woman with abnormal uterine bleeding on hormone therapy.  She is status post endometrial ablation and therefore the endometrium cannot  be adequately assessed.  She desires definitive treatment.  The patient will  undergo Da Vinci total laparoscopic hysterectomy and bilateral  salpingo-oophorectomy.      ___________________  Carlena HurlLinda J Mathison-Ezieme MD  Dictated By: .   [facility]  D:05/31/2014 16:23:18  T: 05/31/2014 16:44:29  [id number]  Electronically Authenticated by:  Jodelle GreenLinda J. Mathison Ezieme,  M.D. On 06/04/2014 08:36 PM EST

## 2014-06-03 NOTE — Op Note (Addendum)
[facility]  Inpatient Operation Report  NAME:  [name], [name]  SEX:   F  DATE: 06/03/2014  DOB: 1960-07-24  MR#    [id number]  ROOM:  [id number]  ACCT#  [id number]        PREOPERATIVE DIAGNOSES:  Abnormal uterine bleeding on hormone replacement therapy status post  endometrial ablation with inability to sample the endometrium.    POSTOPERATIVE DIAGNOSES:  Abnormal uterine bleeding on hormone replacement therapy status post  endometrial ablation with inability to sample the endometrium.    PROCEDURE:  Da Vinci total laparoscopic hysterectomy and bilateral salpingo-oophorectomy.    FINDINGS:  Normal appearing pelvis.    SURGEON:  Priscille Heidelberg, MD    ASSISTANT:  Dolores Patty.    ANESTHESIA:  General.    SPECIMENS REMOVED:  Uterus, cervix, ovaries and fallopian tubes.    ESTIMATED BLOOD LOSS:  Less than 50 mL    COMPLICATIONS:  None.    PROCEDURE IN DETAIL:  The patient was taken to the operating room and placed in the supine position.  General anesthesia administered.  She was then placed in the dorsal lithotomy  position in [place] stirrups, prepped and draped in the usual sterile manner.    A speculum was placed into the vagina and the anterior lip of the cervix  grasped with a single-tooth tenaculum.  The cervix was gently dilated and the  uterus sounded.  A standard VCare was placed over the cervix.  A Foley  catheter was placed into the vagina.  The speculum and tenaculum were then  removed.    Attention was turned to the abdomen where an incision was made approximately 2  cm above the umbilicus.  A 12 mm blunt trocar and sleeve was passed in without  difficulty.  The abdomen was then insufflated with carbon dioxide.  Two  additional 8 mm blunt trocar and sleeves were passed into the left upper  quadrant under direct visualization with the laparoscope.  An 8 mm blunt  trocar and sleeve was placed into the right upper quadrant as well as a 5 mm   blunt trocar and sleeve for the assistant.  The patient was placed in  Trendelenburg and the [employer] was then docked.    Attention was then turned over to the console where the ureters were noted to  be in the usual anatomic position.  The infundibulopelvic ligaments were then  clamped, sealed and divided.  The round ligaments were then clamped, sealed  and divided.  The uterine arteries were skeletonized, clamped, sealed and  divided.  The vesicouterine peritoneum was then dissected to the level of the  vagina.  The electrosurgical scissors were then used to separate the cervix  from the vagina.  The uterus, cervix, ovaries and tubes were then removed  through the vagina.  The vagina was then closed with running suture of 0 PDS  that was barbed.  All da Vinci trocars were then removed from the abdomen and  the pelvis from the abdomen.  Minimal bleeding had been noted to the procedure  and that pelvis was irrigated with saline.  FloSeal was placed over the cuff.    The incisions were closed in a subcuticular fashion using 4-0 Monocryl and  sterile dressings applied.  The patient tolerated the procedure well.  Lap and  needle counts were correct.  She was taken to the PACU in stable condition.      ___________________  Carlena Hurl Mathison-Ezieme MD  Dictated By:.  MR  D:06/03/2014 20:50:33  T: 06/03/2014 21:34:04  [id number]  Electronically Authenticated by:  Jodelle GreenLinda J. Mathison Ezieme, M.D. On 06/04/2014 08:33 PM EST

## 2014-06-03 NOTE — Op Note (Signed)
[facility]  [facility] Operation [facility]  NAME:  [name], [name]  SEX:   F  DATE: 06/03/2014  DOB: 06/27/1960  MR#    [id number]  ROOM:  [id number]  ACCT#  [id number]        PREOPERATIVE DIAGNOSES:  Abnormal uterine bleeding on hormone replacement therapy status post  endometrial ablation with inability to sample the endometrium.    POSTOPERATIVE DIAGNOSES:  Abnormal uterine bleeding on hormone replacement therapy status post  endometrial ablation with inability to sample the endometrium.    PROCEDURE:  Da Vinci total laparoscopic hysterectomy and bilateral salpingo-oophorectomy.    FINDINGS:  Normal appearing pelvis.    SURGEON:  Priscille HeidelbergLinda Mathison-Ezieme, MD    ASSISTANT:  Dolores PattyScott McDonald.    ANESTHESIA:  General.    SPECIMENS REMOVED:  Uterus, cervix, ovaries and fallopian tubes.    ESTIMATED BLOOD LOSS:  Less than 50 mL    COMPLICATIONS:  None.    PROCEDURE IN DETAIL:  The patient was taken to the operating room and placed in the supine position.  General anesthesia administered.  She was then placed in the dorsal lithotomy  position in [place] stirrups, prepped and draped in the usual sterile manner.    A speculum was placed into the vagina and the anterior lip of the cervix  grasped with a single-tooth tenaculum.  The cervix was gently dilated and the  uterus sounded.  A standard VCare was placed over the cervix.  A Foley  catheter was placed into the vagina.  The speculum and tenaculum were then  removed.    Attention was turned to the abdomen where an incision was made approximately 2  cm above the umbilicus.  A 12 mm blunt trocar and sleeve was passed in without  difficulty.  The abdomen was then insufflated with carbon dioxide.  Two  additional 8 mm blunt trocar and sleeves were passed into the left upper  quadrant under direct visualization with the laparoscope.  An 8 mm blunt  trocar and sleeve was placed into the right upper quadrant as well as a 5 mm  blunt trocar and sleeve for the assistant.  The patient  was placed in  Trendelenburg and the [employer] Vinci was then docked.    Attention was then turned over to the console where the ureters were noted to  be in the usual anatomic position.  The infundibulopelvic ligaments were then  clamped, sealed and divided.  The round ligaments were then clamped, sealed  and divided.  The uterine arteries were skeletonized, clamped, sealed and  divided.  The vesicouterine peritoneum was then dissected to the level of the  vagina.  The electrosurgical scissors were then used to separate the cervix  from the vagina.  The uterus, cervix, ovaries and tubes were then removed  through the vagina.  The vagina was then closed with running suture of 0 PDS  that was barbed.  All da Vinci trocars were then removed from the abdomen and  the pelvis from the abdomen.  Minimal bleeding had been noted to the procedure  and that pelvis was irrigated with saline.  FloSeal was placed over the cuff.    The incisions were closed in a subcuticular fashion using 4-0 Monocryl and  sterile dressings applied.  The patient tolerated the procedure well.  Lap and  needle counts were correct.  She was taken to the PACU in stable condition.      ___________________  Carlena HurlLinda J Mathison-Ezieme MD  Dictated By:.  MR  D:06/03/2014 20:50:33  T: 06/03/2014 21:34:04  [id number]  Electronically Authenticated by:  Jodelle GreenLinda J. Mathison Ezieme, M.D. On 06/04/2014 08:33 PM EST

## 2014-06-04 LAB — HGB & HCT
HCT: 38.5 % (ref 37.0–50.0)
HGB: 12.4 gm/dl — ABNORMAL LOW (ref 13.0–17.2)

## 2014-09-23 DIAGNOSIS — M545 Low back pain, unspecified: Secondary | ICD-10-CM | POA: Insufficient documentation

## 2014-11-05 DIAGNOSIS — R5382 Chronic fatigue, unspecified: Secondary | ICD-10-CM | POA: Insufficient documentation

## 2014-11-05 DIAGNOSIS — G4733 Obstructive sleep apnea (adult) (pediatric): Secondary | ICD-10-CM | POA: Insufficient documentation

## 2014-11-05 DIAGNOSIS — G478 Other sleep disorders: Secondary | ICD-10-CM | POA: Insufficient documentation

## 2014-11-05 DIAGNOSIS — Z72821 Inadequate sleep hygiene: Secondary | ICD-10-CM | POA: Insufficient documentation

## 2015-04-26 LAB — BASIC METABOLIC PANEL
BUN: 13 (ref 4–21)
CREATININE: 0.7 (ref 0.5–1.1)
Glucose: 114
Potassium: 4.3 (ref 3.4–5.3)
SODIUM: 139 (ref 137–147)

## 2015-04-26 LAB — HEPATIC FUNCTION PANEL
ALK PHOS: 87 (ref 25–125)
ALT: 12 (ref 7–35)
AST: 13 (ref 13–35)
BILIRUBIN, TOTAL: 0.2

## 2015-04-26 LAB — LIPID PANEL
CHOLESTEROL: 196 (ref 0–200)
HDL: 72 — AB (ref 35–70)
LDL CALC: 104
TRIGLYCERIDES: 98 (ref 40–160)

## 2015-06-17 ENCOUNTER — Encounter

## 2016-01-20 LAB — BASIC METABOLIC PANEL
BUN: 13 (ref 4–21)
CREATININE: 0.7 (ref 0.5–1.1)
GLUCOSE: 125
Potassium: 4.4 (ref 3.4–5.3)
Sodium: 143 (ref 137–147)

## 2016-01-20 LAB — LIPID PANEL
Cholesterol: 172 (ref 0–200)
HDL: 71 — AB (ref 35–70)
LDL CALC: 80
Triglycerides: 109 (ref 40–160)

## 2016-01-20 LAB — HEPATIC FUNCTION PANEL
ALK PHOS: 81 (ref 25–125)
ALT: 18 (ref 7–35)
AST: 25 (ref 13–35)
BILIRUBIN, TOTAL: 0.1

## 2017-06-01 DIAGNOSIS — B370 Candidal stomatitis: Secondary | ICD-10-CM | POA: Diagnosis not present

## 2017-06-14 DIAGNOSIS — J4540 Moderate persistent asthma, uncomplicated: Secondary | ICD-10-CM | POA: Diagnosis not present

## 2017-06-14 DIAGNOSIS — J301 Allergic rhinitis due to pollen: Secondary | ICD-10-CM | POA: Diagnosis not present

## 2017-06-14 DIAGNOSIS — J3089 Other allergic rhinitis: Secondary | ICD-10-CM | POA: Diagnosis not present

## 2017-06-25 DIAGNOSIS — J301 Allergic rhinitis due to pollen: Secondary | ICD-10-CM | POA: Diagnosis not present

## 2017-06-26 DIAGNOSIS — J3089 Other allergic rhinitis: Secondary | ICD-10-CM | POA: Diagnosis not present

## 2017-06-27 ENCOUNTER — Encounter: Payer: Self-pay | Admitting: Family Medicine

## 2017-06-27 ENCOUNTER — Ambulatory Visit: Payer: Self-pay | Admitting: Family Medicine

## 2017-06-27 VITALS — BP 128/82 | HR 83 | Ht 62.0 in | Wt 238.0 lb

## 2017-06-27 DIAGNOSIS — J301 Allergic rhinitis due to pollen: Secondary | ICD-10-CM

## 2017-06-27 DIAGNOSIS — K219 Gastro-esophageal reflux disease without esophagitis: Secondary | ICD-10-CM | POA: Insufficient documentation

## 2017-06-27 DIAGNOSIS — G4733 Obstructive sleep apnea (adult) (pediatric): Secondary | ICD-10-CM

## 2017-06-27 DIAGNOSIS — M5126 Other intervertebral disc displacement, lumbar region: Secondary | ICD-10-CM

## 2017-06-27 DIAGNOSIS — J4530 Mild persistent asthma, uncomplicated: Secondary | ICD-10-CM

## 2017-06-27 DIAGNOSIS — J45909 Unspecified asthma, uncomplicated: Secondary | ICD-10-CM | POA: Insufficient documentation

## 2017-06-27 DIAGNOSIS — I10 Essential (primary) hypertension: Secondary | ICD-10-CM | POA: Diagnosis not present

## 2017-06-27 DIAGNOSIS — J3089 Other allergic rhinitis: Secondary | ICD-10-CM | POA: Insufficient documentation

## 2017-06-27 DIAGNOSIS — M5136 Other intervertebral disc degeneration, lumbar region: Secondary | ICD-10-CM | POA: Insufficient documentation

## 2017-06-27 MED ORDER — LOSARTAN POTASSIUM 50 MG PO TABS: 50.0000 mg | ORAL_TABLET | Freq: Every day | ORAL | 1 refills | Status: DC

## 2017-06-27 MED ORDER — MOMETASONE FUROATE 50 MCG/ACT NA SUSP: NASAL | 3 refills | Status: DC

## 2017-06-27 MED ORDER — HYDROCHLOROTHIAZIDE 12.5 MG PO TABS: 12.5000 mg | ORAL_TABLET | Freq: Every day | ORAL | 1 refills | Status: DC

## 2017-06-27 MED ORDER — PREDNISONE 20 MG PO TABS: 20.0000 mg | ORAL_TABLET | Freq: Two times a day (BID) | ORAL | 0 refills | Status: AC

## 2017-06-27 MED ORDER — KETOROLAC TROMETHAMINE 60 MG/2ML IM SOLN
60.0000 mg | Freq: Once | INTRAMUSCULAR | Status: AC
  Administered 2017-06-27: 60 mg via INTRAMUSCULAR

## 2017-06-27 NOTE — Addendum Note (Signed)
Addended by: Kateri Mc E on: 06/27/2017 04:27 PM   Modules accepted: Orders

## 2017-06-27 NOTE — Progress Notes (Addendum)
Subjective:  Patient ID: Grace Gregory, female    DOB: 1961/04/05  Age: 57 y.o. MRN: [id number]  CC: Establish Care   HPI Grace Gregory presents for the establishment of care.  She presents with a 3-day history of nasal congestion drainage that has been clear.  There is been scant purulence.  She has had a headache.  She is having postnasal drip.  She has been wheezing.  She has had no fever, chills, vomiting.  She has been having problems with her sinuses since she moved to this area from [place] back in October.  She has a history of allergy rhinitis and has been diagnosed with what sounds like reactive airway disease.  She has no asthma history and does not smoke.  She is she is on desensitization therapy for pollen and has been told that she is allergic to grass and trees as well.  She saw her ENT doctor the first part of last month who refilled her Brio and pro-air.  She took the antibiotic erythromycin at that time.  A week later she developed thrush and was given nystatin.  Her sinuses have been mostly clear since that time up until 3 days ago.  She is not tolerated Flonase in the past but thinks that she did better with Nasonex.  She lives with her husband.  She does not use illicit drugs and drinks alcohol rarely.  She had a hysterectomy 3 years ago for excessive bleeding.  She is planning on following up with a GYN provider for her routine GYN care.  She has a history of bulging disks.  She takes the gabapentin and Topamax regularly.  She uses Voltaren and Robaxin only as needed.  Her blood pressure is well controlled with metoprolol.  She has a history of sleep apnea and uses a CPAP machine.  She had a colonoscopy 6 or 7 years ago.  History Laney has a past medical history of Arthritis, GERD (gastroesophageal reflux disease), and Hypertension.   She has a past surgical history that includes Abdominal hysterectomy.   Her family history includes Cancer in her brother; Heart disease in  her father; Hypertension in her brother, brother, mother, sister, sister, and sister.She reports that  has never smoked. she has never used smokeless tobacco. Her alcohol and drug histories are not on file.  Outpatient Medications Prior to Visit  Medication Sig Dispense Refill  . BREO ELLIPTA 200-25 MCG/INH AEPB Use as directed    . cetirizine (ZYRTEC) 10 MG tablet Take 10 mg by mouth daily.    . diclofenac (VOLTAREN) 75 MG EC tablet Take 75 mg by mouth as needed.    . gabapentin (NEURONTIN) 300 MG capsule Take 1 capsule by mouth 2 (two) times daily.    . methocarbamol (ROBAXIN) 750 MG tablet Take 750 mg by mouth as needed for muscle spasms.    . metoprolol succinate (TOPROL-XL) 25 MG 24 hr tablet Take 1 tablet by mouth daily.    . montelukast (SINGULAIR) 10 MG tablet Take 1 tablet by mouth daily.    Marland Kitchen omeprazole (PRILOSEC) 40 MG capsule Take 1 capsule by mouth daily.    Marland Kitchen PREMARIN 1.25 MG tablet Take 1 tablet by mouth daily.    Marland Kitchen PROAIR HFA 108 (90 Base) MCG/ACT inhaler Use as directed    . topiramate (TOPAMAX) 25 MG tablet Take 1 tablet by mouth 3 (three) times daily.     No facility-administered medications prior to visit.     ROS Review  of Systems  Constitutional: Positive for fatigue. Negative for chills, fever and unexpected weight change.  HENT: Positive for congestion, postnasal drip, rhinorrhea, sinus pain and sneezing. Negative for trouble swallowing and voice change.   Eyes: Negative for photophobia and visual disturbance.  Respiratory: Positive for cough and wheezing. Negative for chest tightness and shortness of breath.   Cardiovascular: Negative for chest pain.  Gastrointestinal: Negative.   Endocrine: Negative for polyphagia and polyuria.  Musculoskeletal: Positive for back pain. Negative for arthralgias and myalgias.  Allergic/Immunologic: Negative for immunocompromised state.  Neurological: Positive for headaches. Negative for speech difficulty, weakness and numbness.    Hematological: Does not bruise/bleed easily.  Psychiatric/Behavioral: Negative.     Objective:  BP 128/82 (BP Location: Left Arm, Patient Position: Sitting, Cuff Size: Normal)   Pulse 83   Ht 5\' 2"  (1.575 m)   Wt 238 lb (108 kg)   LMP  (LMP Unknown)   SpO2 95%   BMI 43.53 kg/m   Physical Exam  Constitutional: She is oriented to person, place, and time. She appears well-developed and well-nourished. No distress.  HENT:  Head: Normocephalic and atraumatic.  Right Ear: External ear normal.  Left Ear: External ear normal.  Mouth/Throat: Uvula is midline and mucous membranes are normal. No oropharyngeal exudate.    Eyes: Conjunctivae are normal. Pupils are equal, round, and reactive to light. Right eye exhibits no discharge. Left eye exhibits no discharge. No scleral icterus.  Neck: Neck supple. No JVD present. No tracheal deviation present. No thyromegaly present.  Cardiovascular: Normal rate, regular rhythm and normal heart sounds.  Pulmonary/Chest: Effort normal and breath sounds normal. No stridor. No respiratory distress. She has no wheezes. She has no rales.  Abdominal: Bowel sounds are normal.  Lymphadenopathy:    She has no cervical adenopathy.  Neurological: She is alert and oriented to person, place, and time.  Skin: Skin is warm and dry. She is not diaphoretic.  Psychiatric: She has a normal mood and affect. Her behavior is normal.      Assessment & Plan:   Grace Gregory was seen today for establish care.  Diagnoses and all orders for this visit:  Bulging of lumbar intervertebral disc -     predniSONE (DELTASONE) 20 MG tablet; Take 1 tablet (20 mg total) by mouth 2 (two) times daily for 7 days. -     ketorolac (TORADOL) injection 60 mg  Mild persistent reactive airway disease without complication -     predniSONE (DELTASONE) 20 MG tablet; Take 1 tablet (20 mg total) by mouth 2 (two) times daily for 7 days. -     mometasone (NASONEX) 50 MCG/ACT nasal spray; One spray  each nare daily. May increase to 2 sprays each nare as tolerated.  Gastroesophageal reflux disease, esophagitis presence not specified  Obstructive sleep apnea syndrome  Non-seasonal allergic rhinitis due to pollen -     predniSONE (DELTASONE) 20 MG tablet; Take 1 tablet (20 mg total) by mouth 2 (two) times daily for 7 days. -     mometasone (NASONEX) 50 MCG/ACT nasal spray; One spray each nare daily. May increase to 2 sprays each nare as tolerated.  Essential hypertension  Other orders -     losartan (COZAAR) 50 MG tablet; Take 1 tablet (50 mg total) by mouth daily. -     hydrochlorothiazide (HYDRODIURIL) 12.5 MG tablet; Take 1 tablet (12.5 mg total) by mouth daily.   I am having Megan Mans start on predniSONE, mometasone, losartan, and hydrochlorothiazide. I am  also having her maintain her PROAIR HFA, PREMARIN, BREO ELLIPTA, gabapentin, metoprolol succinate, montelukast, omeprazole, topiramate, diclofenac, methocarbamol, and cetirizine. We administered ketorolac.  Meds ordered this encounter  Medications  . predniSONE (DELTASONE) 20 MG tablet    Sig: Take 1 tablet (20 mg total) by mouth 2 (two) times daily for 7 days.    Dispense:  14 tablet    Refill:  0  . mometasone (NASONEX) 50 MCG/ACT nasal spray    Sig: One spray each nare daily. May increase to 2 sprays each nare as tolerated.    Dispense:  17 g    Refill:  3  . ketorolac (TORADOL) injection 60 mg  . losartan (COZAAR) 50 MG tablet    Sig: Take 1 tablet (50 mg total) by mouth daily.    Dispense:  90 tablet    Refill:  1  . hydrochlorothiazide (HYDRODIURIL) 12.5 MG tablet    Sig: Take 1 tablet (12.5 mg total) by mouth daily.    Dispense:  90 tablet    Refill:  1   I told her that using a nasal steroid regularly might help her symptoms a great deal.  I realize that Flonase is led to nosebleeds.  She agreed to try the Nasonex instead.  We discussed at length strategies to use if she develops nosebleeds with the Nasonex  as well such as backing off with the sprays for 2 or 3 days, restarting at a lower dose and using the spray every other day.  She is planning on following up with ENT to resume her desensitization therapy.  We discussed the pneumonia vaccines and I recommended them.  She will consider them.  Follow-up: Return in about 1 week (around 07/04/2017), or if symptoms worsen or fail to improve.  Libby Maw, MD

## 2017-07-02 ENCOUNTER — Ambulatory Visit: Payer: Self-pay | Admitting: Nurse Practitioner

## 2017-07-02 DIAGNOSIS — J301 Allergic rhinitis due to pollen: Secondary | ICD-10-CM | POA: Diagnosis not present

## 2017-07-02 DIAGNOSIS — J3089 Other allergic rhinitis: Secondary | ICD-10-CM | POA: Diagnosis not present

## 2017-07-04 DIAGNOSIS — J301 Allergic rhinitis due to pollen: Secondary | ICD-10-CM | POA: Diagnosis not present

## 2017-07-04 DIAGNOSIS — J3089 Other allergic rhinitis: Secondary | ICD-10-CM | POA: Diagnosis not present

## 2017-07-09 ENCOUNTER — Telehealth: Payer: Self-pay | Admitting: Family Medicine

## 2017-07-09 ENCOUNTER — Ambulatory Visit: Payer: BLUE CROSS/BLUE SHIELD | Admitting: Family Medicine

## 2017-07-09 ENCOUNTER — Encounter: Payer: Self-pay | Admitting: Family Medicine

## 2017-07-09 VITALS — BP 128/80 | HR 60 | Temp 97.5°F | Ht 62.0 in | Wt 241.0 lb

## 2017-07-09 DIAGNOSIS — J4521 Mild intermittent asthma with (acute) exacerbation: Secondary | ICD-10-CM | POA: Diagnosis not present

## 2017-07-09 DIAGNOSIS — J40 Bronchitis, not specified as acute or chronic: Secondary | ICD-10-CM | POA: Diagnosis not present

## 2017-07-09 MED ORDER — PREDNISONE 10 MG PO TABS: 10.0000 mg | ORAL_TABLET | Freq: Two times a day (BID) | ORAL | 0 refills | Status: AC

## 2017-07-09 MED ORDER — BENZONATATE 100 MG PO CAPS: 100.0000 mg | ORAL_CAPSULE | Freq: Two times a day (BID) | ORAL | 0 refills | Status: DC | PRN

## 2017-07-09 MED ORDER — AZITHROMYCIN 250 MG PO TABS: ORAL_TABLET | ORAL | 0 refills | Status: DC

## 2017-07-09 NOTE — Telephone Encounter (Signed)
Appt scheduled for today. No improvement from symptoms on 2/6. Per notes of Dr. Ethelene Hal on 2/6 pt would need have a return office visit if symptoms failed to improve.

## 2017-07-09 NOTE — Addendum Note (Signed)
Addended by: Jon Billings on: 07/09/2017 12:15 PM   Modules accepted: Level of Service

## 2017-07-09 NOTE — Telephone Encounter (Signed)
Copied from [place] [phone]. Topic: Quick Communication - See Telephone Encounter >> Jul 09, 2017 10:41 AM Boyd Kerbs wrote: CRM for notification. See Telephone encounter for:    she called in saying not feeling any better and is more congested and weezing, not feeling good, aches  Can you call in something else the prednisone did not work,  she did finish the medicine  07/09/17.

## 2017-07-09 NOTE — Progress Notes (Signed)
Subjective:  Patient ID: Grace Gregory, female    DOB: 1960-09-11  Age: 57 y.o. MRN: [id number]  CC: cold like symptoms   HPI Grace Gregory presents for fu of rad as diagnosed last visit. Cough persists and is now wetter than before and occ productive. There is now malaise. Has felt warm. She has been using her inhalers as directed.  She is as directed without issue.  She is taking an antibiotic in the past that gave her thrush but cannot remember the name of it.  Outpatient Medications Prior to Visit  Medication Sig Dispense Refill  . BREO ELLIPTA 200-25 MCG/INH AEPB Use as directed    . cetirizine (ZYRTEC) 10 MG tablet Take 10 mg by mouth daily.    . diclofenac (VOLTAREN) 75 MG EC tablet Take 75 mg by mouth as needed.    . gabapentin (NEURONTIN) 300 MG capsule Take 1 capsule by mouth 2 (two) times daily.    . hydrochlorothiazide (HYDRODIURIL) 12.5 MG tablet Take 1 tablet (12.5 mg total) by mouth daily. 90 tablet 1  . losartan (COZAAR) 50 MG tablet Take 1 tablet (50 mg total) by mouth daily. 90 tablet 1  . methocarbamol (ROBAXIN) 750 MG tablet Take 750 mg by mouth as needed for muscle spasms.    . metoprolol succinate (TOPROL-XL) 25 MG 24 hr tablet Take 1 tablet by mouth daily.    . mometasone (NASONEX) 50 MCG/ACT nasal spray One spray each nare daily. May increase to 2 sprays each nare as tolerated. 17 g 3  . montelukast (SINGULAIR) 10 MG tablet Take 1 tablet by mouth daily.    Marland Kitchen omeprazole (PRILOSEC) 40 MG capsule Take 1 capsule by mouth daily.    Marland Kitchen PREMARIN 1.25 MG tablet Take 1 tablet by mouth daily.    Marland Kitchen PROAIR HFA 108 (90 Base) MCG/ACT inhaler Use as directed    . topiramate (TOPAMAX) 25 MG tablet Take 1 tablet by mouth 3 (three) times daily.     No facility-administered medications prior to visit.     ROS Review of Systems  Constitutional: Positive for fatigue. Negative for chills, fever and unexpected weight change.  HENT: Positive for congestion, postnasal drip and  rhinorrhea.   Eyes: Negative for photophobia and visual disturbance.  Respiratory: Positive for cough and wheezing. Negative for chest tightness and shortness of breath.   Cardiovascular: Negative.   Gastrointestinal: Negative.   Musculoskeletal: Negative for arthralgias and myalgias.  Skin: Negative for rash.  Allergic/Immunologic: Negative for immunocompromised state.  Hematological: Does not bruise/bleed easily.  Psychiatric/Behavioral: Negative.     Objective:  BP 128/80 (BP Location: Left Arm, Patient Position: Sitting, Cuff Size: Large)   Pulse 60   Temp (!) 97.5 F (36.4 C) (Oral)   Ht 5\' 2"  (1.575 m)   Wt 241 lb (109.3 kg)   LMP  (LMP Unknown)   SpO2 98%   BMI 44.08 kg/m   BP Readings from Last 3 Encounters:  07/09/17 128/80  06/27/17 128/82    Wt Readings from Last 3 Encounters:  07/09/17 241 lb (109.3 kg)  06/27/17 238 lb (108 kg)    Physical Exam  Constitutional: She is oriented to person, place, and time. She appears well-developed and well-nourished. No distress.  HENT:  Head: Normocephalic and atraumatic.  Right Ear: External ear normal.  Left Ear: External ear normal.  Mouth/Throat: Oropharynx is clear and moist. No oropharyngeal exudate.  Eyes: Conjunctivae are normal. Pupils are equal, round, and reactive to light. Right eye  exhibits no discharge. Left eye exhibits no discharge. No scleral icterus.  Neck: Neck supple. No JVD present. No tracheal deviation present. No thyromegaly present.  Cardiovascular: Normal rate, regular rhythm and normal heart sounds.  Pulmonary/Chest: Effort normal. No stridor. No respiratory distress. She has no wheezes. She has no rales.  Abdominal: Bowel sounds are normal.  Lymphadenopathy:    She has no cervical adenopathy.  Neurological: She is alert and oriented to person, place, and time.  Skin: Skin is warm and dry. She is not diaphoretic.  Psychiatric: She has a normal mood and affect. Her behavior is normal.    No  results found for: WBC, HGB, HCT, PLT, GLUCOSE, CHOL, TRIG, HDL, LDLDIRECT, LDLCALC, ALT, AST, NA, K, CL, CREATININE, BUN, CO2, TSH, PSA, INR, GLUF, HGBA1C, MICROALBUR  No results found.  Assessment & Plan:   Grace Gregory was seen today for cold like symptoms.  Diagnoses and all orders for this visit:  Bronchitis -     predniSONE (DELTASONE) 10 MG tablet; Take 1 tablet (10 mg total) by mouth 2 (two) times daily with a meal for 5 days. -     benzonatate (TESSALON) 100 MG capsule; Take 1 capsule (100 mg total) by mouth 2 (two) times daily as needed for cough.  Mild intermittent reactive airway disease with acute exacerbation -     azithromycin (ZITHROMAX) 250 MG tablet; Take 2 today and then 1 each day until finished.   I am having Grace Gregory start on predniSONE, azithromycin, and benzonatate. I am also having her maintain her PROAIR HFA, PREMARIN, BREO ELLIPTA, gabapentin, metoprolol succinate, montelukast, omeprazole, topiramate, diclofenac, methocarbamol, cetirizine, mometasone, losartan, and hydrochlorothiazide.  Meds ordered this encounter  Medications  . predniSONE (DELTASONE) 10 MG tablet    Sig: Take 1 tablet (10 mg total) by mouth 2 (two) times daily with a meal for 5 days.    Dispense:  10 tablet    Refill:  0  . azithromycin (ZITHROMAX) 250 MG tablet    Sig: Take 2 today and then 1 each day until finished.    Dispense:  6 tablet    Refill:  0  . benzonatate (TESSALON) 100 MG capsule    Sig: Take 1 capsule (100 mg total) by mouth 2 (two) times daily as needed for cough.    Dispense:  20 capsule    Refill:  0     Follow-up: No Follow-up on file.  Libby Maw, MD

## 2017-07-11 ENCOUNTER — Encounter: Payer: Self-pay | Admitting: Family Medicine

## 2017-07-11 DIAGNOSIS — J301 Allergic rhinitis due to pollen: Secondary | ICD-10-CM | POA: Diagnosis not present

## 2017-07-11 DIAGNOSIS — J3089 Other allergic rhinitis: Secondary | ICD-10-CM | POA: Diagnosis not present

## 2017-07-13 ENCOUNTER — Encounter: Payer: Self-pay | Admitting: Family Medicine

## 2017-07-13 DIAGNOSIS — J3089 Other allergic rhinitis: Secondary | ICD-10-CM | POA: Diagnosis not present

## 2017-07-13 DIAGNOSIS — J301 Allergic rhinitis due to pollen: Secondary | ICD-10-CM | POA: Diagnosis not present

## 2017-07-16 ENCOUNTER — Telehealth: Payer: Self-pay | Admitting: Family Medicine

## 2017-07-16 DIAGNOSIS — J3089 Other allergic rhinitis: Secondary | ICD-10-CM | POA: Diagnosis not present

## 2017-07-16 DIAGNOSIS — J301 Allergic rhinitis due to pollen: Secondary | ICD-10-CM | POA: Diagnosis not present

## 2017-07-16 NOTE — Telephone Encounter (Signed)
Copied from [place] [id number]. Topic: Quick Communication - Rx Refill/Question >> Jul 16, 2017  4:53 PM Margot Ables wrote: Pt completed ABX and now has thrush again. Tongue is sore, cracked, and yellowish. She is requesting a med to treat thrush.  [place], [place] [phone] (Phone) [phone] (Fax)

## 2017-07-17 ENCOUNTER — Encounter: Payer: Self-pay | Admitting: Family Medicine

## 2017-07-17 NOTE — Telephone Encounter (Signed)
Pt calling to get an update on status of medication, contact pt to advise

## 2017-07-18 ENCOUNTER — Other Ambulatory Visit: Payer: Self-pay

## 2017-07-18 MED ORDER — CLOTRIMAZOLE 10 MG MT TROC: OROMUCOSAL | 0 refills | Status: DC

## 2017-07-19 DIAGNOSIS — J301 Allergic rhinitis due to pollen: Secondary | ICD-10-CM | POA: Diagnosis not present

## 2017-07-19 DIAGNOSIS — J3089 Other allergic rhinitis: Secondary | ICD-10-CM | POA: Diagnosis not present

## 2017-07-26 DIAGNOSIS — J3089 Other allergic rhinitis: Secondary | ICD-10-CM | POA: Diagnosis not present

## 2017-07-26 DIAGNOSIS — J301 Allergic rhinitis due to pollen: Secondary | ICD-10-CM | POA: Diagnosis not present

## 2017-08-31 ENCOUNTER — Encounter: Payer: Self-pay | Admitting: Family Medicine

## 2017-08-31 MED ORDER — TOPIRAMATE 25 MG PO TABS: 25.0000 mg | ORAL_TABLET | Freq: Three times a day (TID) | ORAL | 2 refills | Status: DC

## 2017-08-31 MED ORDER — GABAPENTIN 300 MG PO CAPS: 300.0000 mg | ORAL_CAPSULE | Freq: Two times a day (BID) | ORAL | 2 refills | Status: DC

## 2017-10-01 ENCOUNTER — Encounter: Payer: Self-pay | Admitting: Family Medicine

## 2017-10-01 ENCOUNTER — Ambulatory Visit: Payer: 59 | Admitting: Family Medicine

## 2017-10-01 VITALS — BP 126/80 | HR 86 | Ht 62.0 in | Wt 248.1 lb

## 2017-10-01 DIAGNOSIS — K140 Glossitis: Secondary | ICD-10-CM

## 2017-10-01 MED ORDER — MAGIC MOUTHWASH: ORAL | 0 refills | Status: DC

## 2017-10-01 NOTE — Progress Notes (Signed)
Subjective:  Patient ID: Grace Gregory, female    DOB: Feb 23, 1961  Age: 57 y.o. MRN: [id number]  CC: Thrush   HPI Grace Gregory presents for evaluation of an irritation on her tongue.  None of her medicines have changed.  She is seeing ENT for follow-up of her year-round allergy symptoms.  She is taking Advair and is rinsing after doses.  She has not been taking Pepto-Bismol.  She does have a history of thrush in her past.  Outpatient Medications Prior to Visit  Medication Sig Dispense Refill  . cetirizine (ZYRTEC) 10 MG tablet Take 10 mg by mouth daily.    Marland Kitchen gabapentin (NEURONTIN) 300 MG capsule Take 1 capsule (300 mg total) by mouth 2 (two) times daily. 60 capsule 2  . hydrochlorothiazide (HYDRODIURIL) 12.5 MG tablet Take 1 tablet (12.5 mg total) by mouth daily. 90 tablet 1  . losartan (COZAAR) 50 MG tablet Take 1 tablet (50 mg total) by mouth daily. 90 tablet 1  . metoprolol succinate (TOPROL-XL) 25 MG 24 hr tablet Take 1 tablet by mouth daily.    . mometasone (NASONEX) 50 MCG/ACT nasal spray One spray each nare daily. May increase to 2 sprays each nare as tolerated. 17 g 3  . montelukast (SINGULAIR) 10 MG tablet Take 1 tablet by mouth daily.    Marland Kitchen omeprazole (PRILOSEC) 40 MG capsule Take 1 capsule by mouth daily.    Marland Kitchen PREMARIN 1.25 MG tablet Take 1 tablet by mouth daily.    Marland Kitchen PROAIR HFA 108 (90 Base) MCG/ACT inhaler Use as directed    . BREO ELLIPTA 200-25 MCG/INH AEPB Use as directed    . clotrimazole (MYCELEX) 10 MG troche Dissolve one tablet slowly in mouth 5 times daily for 7 days. 35 tablet 0  . diclofenac (VOLTAREN) 75 MG EC tablet Take 75 mg by mouth as needed.    . methocarbamol (ROBAXIN) 750 MG tablet Take 750 mg by mouth as needed for muscle spasms.    Marland Kitchen topiramate (TOPAMAX) 25 MG tablet Take 1 tablet (25 mg total) by mouth 3 (three) times daily. 90 tablet 2  . azithromycin (ZITHROMAX) 250 MG tablet Take 2 today and then 1 each day until finished. 6 tablet 0  . benzonatate  (TESSALON) 100 MG capsule Take 1 capsule (100 mg total) by mouth 2 (two) times daily as needed for cough. 20 capsule 0   No facility-administered medications prior to visit.     ROS Review of Systems  Constitutional: Negative for chills, fatigue and fever.  HENT: Negative for postnasal drip, rhinorrhea, sore throat, trouble swallowing and voice change.   Eyes: Negative.   Respiratory: Negative.   Cardiovascular: Negative.   Gastrointestinal: Negative.     Objective:  BP 126/80   Pulse 86   Ht 5\' 2"  (1.575 m)   Wt 248 lb 2 oz (112.5 kg)   LMP  (LMP Unknown)   SpO2 96%   BMI 45.38 kg/m   BP Readings from Last 3 Encounters:  10/01/17 126/80  07/09/17 128/80  06/27/17 128/82    Wt Readings from Last 3 Encounters:  10/01/17 248 lb 2 oz (112.5 kg)  07/09/17 241 lb (109.3 kg)  06/27/17 238 lb (108 kg)    Physical Exam  Constitutional: She appears well-developed and well-nourished. No distress.  HENT:  Head: Normocephalic and atraumatic.  Right Ear: External ear normal.  Left Ear: External ear normal.  Mouth/Throat: Oropharynx is clear and moist. No oropharyngeal exudate.    Eyes: Pupils  are equal, round, and reactive to light. Conjunctivae are normal. Right eye exhibits no discharge. Left eye exhibits no discharge.  Neck: No JVD present. No tracheal deviation present. No thyromegaly present.  Cardiovascular: Normal rate, regular rhythm and normal heart sounds.  Pulmonary/Chest: Effort normal.  Lymphadenopathy:    She has no cervical adenopathy.  Skin: Skin is warm and dry. She is not diaphoretic.  Psychiatric: She has a normal mood and affect. Her behavior is normal.    Lab Results  Component Value Date   CHOL 172 01/20/2016   TRIG 109 01/20/2016   HDL 71 (A) 01/20/2016   LDLCALC 80 01/20/2016   ALT 18 01/20/2016   AST 25 01/20/2016   NA 143 01/20/2016   K 4.4 01/20/2016   CREATININE 0.7 01/20/2016   BUN 13 01/20/2016    No results found.  Assessment &  Plan:   Mckynlie was seen today for thrush.  Diagnoses and all orders for this visit:  Glossitis -     magic mouthwash SOLN; One tsp to swish gargle and expectorate 4 times daily.   I have discontinued Mayra Wickey's BREO ELLIPTA, diclofenac, methocarbamol, azithromycin, benzonatate, clotrimazole, and topiramate. I am also having her start on magic mouthwash. Additionally, I am having her maintain her PROAIR HFA, PREMARIN, metoprolol succinate, montelukast, omeprazole, cetirizine, mometasone, losartan, hydrochlorothiazide, and gabapentin.  Meds ordered this encounter  Medications  . magic mouthwash SOLN    Sig: One tsp to swish gargle and expectorate 4 times daily.    Dispense:  200 mL    Refill:  0     Follow-up: Return if symptoms worsen or fail to improve.  Libby Maw, MD

## 2017-10-01 NOTE — Patient Instructions (Signed)
Glossitis Glossitis is inflammation of the tongue. This may be a stand-alone condition or it may be a symptom of a different condition that you have. Generally, glossitis goes away when its cause is identified and treated. Glossitis can be dangerous if it causes difficulty breathing. What are the causes? This condition can be caused by many different things. In some cases, the cause may not be known. Certain underlying conditions may cause glossitis, such as:  Viral, bacterial, or yeast infections.  Allergies.  Dysfunction of the skin and mucous membranes. This can happen with certain autoimmune disorders.  Abnormal tissue growths (tumors).  Lack of healthy red blood cells (anemia).  Movement of stomach acid into the tube that connects the mouth and the stomach (gastroesophageal reflux).  Lack of proper nutrition or certain vitamins.  Certain lifelong (chronic) medical conditions, such as diabetes.  Sometimes, glossitis may not be caused by an underlying condition. In these cases, glossitis may be caused by:  Use of tobacco products, such as cigarettes, chewing tobacco, or e-cigarettes.  Excessive alcohol use.  Tongue injury or irritation.  Certain medicines, such as medicines to treat cancer.  What increases the risk? This condition is more likely to develop in:  People who are 50 years or older.  Men.  People who are taking antibiotics or steroids, such as asthma medicines.  People who drink alcohol excessively.  People who use tobacco products, including cigarettes, chewing tobacco, or e-cigarettes.  People with chronic medical conditions, such as immune diseases or cancer.  People who do not brush or floss their teeth regularly.  People who lack proper nutrition or are anemic.  What are the signs or symptoms? Symptoms of this condition vary depending on the cause. Symptoms may include:  Swelling of the tongue.  Pain and tenderness in the tongue. Sometimes,  this condition is painless.  Changes in tongue color. The tongue may be pale or bright red.  Smooth areas on the tongue's surface.  A small mass of tissue (node) or white patch on the tongue.  Difficulty chewing, swallowing, or talking.  Difficulty breathing.  How is this diagnosed? This condition is diagnosed based on a physical exam and medical history. Your health care provider may ask you about your eating and drinking habits. You may have tests, including:  Blood tests.  Removal of a small amount of cells from the tongue that are examined under a microscope (biopsy).  You may be given the name of a dentist or a health care provider who specializes in ear, nose, and throat (ENT) problems (otolaryngologist). How is this treated? Treatment for this condition depends on the underlying cause and may include:  Following instructions from your health care provider about keeping your mouth clean and avoiding irritants that may have caused your condition or made it worse.  Nutritional therapy. Your health care provider may tell you to change your eating and drinking habits or take a nutritional supplement.  Managing underlying conditions that may have caused your glossitis.  Medicines, such as: ? Corticosteroids to reduce inflammation. ? Antibiotics if your condition was caused by an infection. ? Local anestheticsthat numb your tongue or mouth (local anesthetics).  Follow these instructions at home:  Keep your teeth and mouth clean. This includes brushing and flossing frequently and having regular dental checkups.  If you wear dentures or dental braces, work with your dentist to make sure they fit correctly.  Eat healthy foods. Follow instructions from your health care provider about eating or drinking  restrictions.  Avoid tobacco products, including cigarettes, chewing tobacco, or e-cigarettes. If you need help quitting, ask your health care provider.  Avoid excessive  alcohol use.  Avoid any irritants that may have caused your condition or made it worse, such as chemicals or certain foods.  Keep all follow-up visits as told by your health care provider. This is important. Contact a health care provider if:  You have a fever.  You develop new symptoms.  You have symptoms that do not get better with medicine or get worse.  You have symptoms that do not go away after 10 days.  You cannot eat or drink because of your pain. Get help right away if:  You have severe pain or swelling.  You have difficulty breathing, swallowing, or talking. This information is not intended to replace advice given to you by your health care provider. Make sure you discuss any questions you have with your health care provider. Document Released: 04/28/2002 Document Revised: 10/14/2015 Document Reviewed: 09/23/2014 Elsevier Interactive Patient Education  2018 [employer].

## 2017-10-03 ENCOUNTER — Encounter: Payer: Self-pay | Admitting: Family Medicine

## 2017-10-03 ENCOUNTER — Other Ambulatory Visit: Payer: Self-pay

## 2017-10-03 MED ORDER — GABAPENTIN 300 MG PO CAPS: 300.0000 mg | ORAL_CAPSULE | Freq: Two times a day (BID) | ORAL | 2 refills | Status: DC

## 2017-10-09 ENCOUNTER — Other Ambulatory Visit: Payer: Self-pay

## 2017-10-09 MED ORDER — METOPROLOL SUCCINATE ER 25 MG PO TB24: 25.0000 mg | ORAL_TABLET | Freq: Every day | ORAL | 1 refills | Status: DC

## 2017-10-12 ENCOUNTER — Encounter: Payer: Self-pay | Admitting: Family Medicine

## 2017-10-17 ENCOUNTER — Encounter: Payer: Self-pay | Admitting: Family Medicine

## 2017-10-17 MED ORDER — PREMARIN 1.25 MG PO TABS: 1.2500 mg | ORAL_TABLET | Freq: Every day | ORAL | 0 refills | Status: DC

## 2017-10-17 MED ORDER — LOSARTAN POTASSIUM 50 MG PO TABS: 50.0000 mg | ORAL_TABLET | Freq: Every day | ORAL | 1 refills | Status: DC

## 2017-11-20 ENCOUNTER — Encounter: Payer: Self-pay | Admitting: Family Medicine

## 2017-11-20 ENCOUNTER — Other Ambulatory Visit: Payer: Self-pay | Admitting: Family Medicine

## 2017-11-20 ENCOUNTER — Telehealth: Payer: Self-pay

## 2017-11-20 ENCOUNTER — Other Ambulatory Visit: Payer: Self-pay

## 2017-11-20 DIAGNOSIS — R928 Other abnormal and inconclusive findings on diagnostic imaging of breast: Secondary | ICD-10-CM

## 2017-11-20 DIAGNOSIS — Z01419 Encounter for gynecological examination (general) (routine) without abnormal findings: Secondary | ICD-10-CM

## 2017-11-20 MED ORDER — PREMARIN 1.25 MG PO TABS: 1.2500 mg | ORAL_TABLET | Freq: Every day | ORAL | 0 refills | Status: DC

## 2017-11-20 MED FILL — PREMARIN 1.25 MG TABLET: 1.25 | 30 days supply | Qty: 30 | Fill #0

## 2017-11-20 MED FILL — OMEPRAZOLE 40 MG CPDR: 40 | 60 days supply | Qty: 60 | Fill #0

## 2017-11-20 NOTE — Telephone Encounter (Signed)
Patient's previous mammogram was incomplete - they suggested additional testing. They are suggesting a diagnostic mammogram and possibly an ultrasound of the right breast.   Okay for me to enter these orders for patient?

## 2017-11-20 NOTE — Addendum Note (Signed)
Addended by: Kateri Mc E on: 11/20/2017 03:15 PM   Modules accepted: Orders

## 2017-11-20 NOTE — Addendum Note (Signed)
Addended by: Kateri Mc E on: 11/20/2017 03:09 PM   Modules accepted: Orders

## 2017-11-20 NOTE — Telephone Encounter (Signed)
Thank you :)

## 2017-11-21 DIAGNOSIS — J301 Allergic rhinitis due to pollen: Secondary | ICD-10-CM | POA: Diagnosis not present

## 2017-11-21 DIAGNOSIS — J3089 Other allergic rhinitis: Secondary | ICD-10-CM | POA: Diagnosis not present

## 2017-11-21 MED FILL — DICLOFENAC SOD EC 75 MG TAB: 75 | 30 days supply | Qty: 60 | Fill #0

## 2017-11-21 MED FILL — TOPIRAMATE 25 MG TAB: 25 | 30 days supply | Qty: 90 | Fill #0

## 2017-11-21 MED FILL — METOPROLOL SUCCINATE ER 25: 25 | 90 days supply | Qty: 90 | Fill #0

## 2017-11-21 MED FILL — MOMETASONE FUROATE 50 MCG S: 50 | 30 days supply | Qty: 17 | Fill #0

## 2017-11-21 MED FILL — LOSARTAN POTASSIUM 50 MG TA: 50 | 90 days supply | Qty: 90 | Fill #0

## 2017-11-21 MED FILL — AZELASTINE HCL 137 MCG/SPRA: 137 | 25 days supply | Qty: 30 | Fill #0

## 2017-11-21 MED FILL — SYMBICORT 160-4.5 MCG INH: 160-4.5 | 30 days supply | Qty: 10 | Fill #0

## 2017-11-23 MED FILL — MICROCHAMBER: 1 days supply | Qty: 1 | Fill #0

## 2017-11-26 ENCOUNTER — Ambulatory Visit: Payer: Self-pay

## 2017-11-26 ENCOUNTER — Ambulatory Visit
Admission: RE | Admit: 2017-11-26 | Discharge: 2017-11-26 | Disposition: A | Discharge status: Home / Self Care | Payer: 59 | Source: Ambulatory Visit | Attending: Family Medicine | Admitting: Family Medicine

## 2017-11-26 DIAGNOSIS — R928 Other abnormal and inconclusive findings on diagnostic imaging of breast: Secondary | ICD-10-CM

## 2017-11-27 DIAGNOSIS — J3089 Other allergic rhinitis: Secondary | ICD-10-CM | POA: Diagnosis not present

## 2017-11-27 DIAGNOSIS — J301 Allergic rhinitis due to pollen: Secondary | ICD-10-CM | POA: Diagnosis not present

## 2017-12-04 ENCOUNTER — Other Ambulatory Visit: Payer: Self-pay

## 2017-12-04 ENCOUNTER — Encounter: Payer: Self-pay | Admitting: Family Medicine

## 2017-12-04 MED ORDER — MONTELUKAST SODIUM 10 MG PO TABS: 10.0000 mg | ORAL_TABLET | Freq: Every day | ORAL | 2 refills | Status: DC

## 2017-12-04 MED FILL — MONTELUKAST SOD 10 MG TAB: 10 | 90 days supply | Qty: 90 | Fill #0

## 2017-12-04 MED FILL — GABAPENTIN 300 MG CAPSULE: 300 | 30 days supply | Qty: 60 | Fill #0

## 2017-12-04 MED FILL — HYDROCHLOROTHIAZIDE 12.5 MG: 12.5 | 30 days supply | Qty: 30 | Fill #0

## 2017-12-05 DIAGNOSIS — J3089 Other allergic rhinitis: Secondary | ICD-10-CM | POA: Diagnosis not present

## 2017-12-05 DIAGNOSIS — J301 Allergic rhinitis due to pollen: Secondary | ICD-10-CM | POA: Diagnosis not present

## 2017-12-11 DIAGNOSIS — J301 Allergic rhinitis due to pollen: Secondary | ICD-10-CM | POA: Diagnosis not present

## 2017-12-11 DIAGNOSIS — J3089 Other allergic rhinitis: Secondary | ICD-10-CM | POA: Diagnosis not present

## 2017-12-20 ENCOUNTER — Ambulatory Visit (INDEPENDENT_AMBULATORY_CARE_PROVIDER_SITE_OTHER): Payer: 59 | Admitting: Obstetrics & Gynecology

## 2017-12-20 ENCOUNTER — Encounter: Payer: Self-pay | Admitting: Obstetrics & Gynecology

## 2017-12-20 VITALS — BP 134/79 | HR 85 | Ht 62.0 in | Wt 248.1 lb

## 2017-12-20 DIAGNOSIS — Z780 Asymptomatic menopausal state: Secondary | ICD-10-CM | POA: Diagnosis not present

## 2017-12-20 DIAGNOSIS — Z01419 Encounter for gynecological examination (general) (routine) without abnormal findings: Secondary | ICD-10-CM | POA: Diagnosis not present

## 2017-12-20 MED ORDER — ESTRADIOL 0.05 MG/24HR TD PTTW: 1.0000 | MEDICATED_PATCH | TRANSDERMAL | 4 refills | Status: DC

## 2017-12-20 MED FILL — ESTRADIOL 0.05 MG PATCH: 0.05 | 28 days supply | Qty: 8 | Fill #0

## 2017-12-20 NOTE — Patient Instructions (Signed)

## 2017-12-20 NOTE — Progress Notes (Signed)
Subjective:     Grace Gregory is a 57 y.o. female here for a routine exam.  LMP s/p hyst with BSO 2 years prev due to AUB. Current complaints: No GYN problems currently. FH: no breast or female cancers. Her brother had brain cancer died at age 70 years of age.  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 She had a lesion that was removed she was seen and follow by GYN ONC for this. Pt reports that her husband looks at the vulva often to make sure that the coloration does not change.       Gynecologic History No LMP recorded (lmp unknown). Patient has had a hysterectomy. Contraception: status post hysterectomy   Last Pap: 09/2016. Results were: normal Last mammogram: 11/26/2017. Results were: abnormal suspect benign  Obstetric History OB History  Gravida Para Term Preterm AB Living  1       1    SAB TAB Ectopic Multiple Live Births  1            # Outcome Date GA Lbr Len/2nd Weight Sex Delivery Anes PTL Lv  1 SAB 2001           The following portions of the patient's history were reviewed and updated as appropriate: allergies, current medications, past family history, past medical history, past social history, past surgical history and problem list.  Review of Systems Pertinent items are noted in HPI.    Objective:  BP 134/79   Pulse 85   Ht 5\' 2"  (1.575 m)   Wt 248 lb 1.3 oz (112.5 kg)   LMP  (LMP Unknown)   BMI 45.37 kg/m   General Appearance:    Alert, cooperative, no distress, appears stated age  Head:    Normocephalic, without obvious abnormality, atraumatic  Eyes:    conjunctiva/corneas clear, EOM's intact, both eyes  Ears:    Normal external ear canals, both ears  Nose:   Nares normal, septum midline, mucosa normal, no drainage    or sinus tenderness  Throat:   Lips, mucosa, and tongue normal; teeth and gums normal  Neck:   Supple, symmetrical, trachea midline, no adenopathy;    thyroid:  no enlargement/tenderness/nodules  Back:     Symmetric, no curvature, ROM  normal, no CVA tenderness  Lungs:     Clear to auscultation bilaterally, respirations unlabored  Chest Wall:    No tenderness or deformity   Heart:    Regular rate and rhythm, S1 and S2 normal, no murmur, rub   or gallop  Breast Exam:    No tenderness, masses, or nipple abnormality  Abdomen:     Soft, non-tender, bowel sounds active all four quadrants,    no masses, no organomegaly  Genitalia:    Normal female without lesion, discharge or tenderness; cervix and uterus surgically absent.  There is a hyperpigmented lesion just to the left of the introitus.        Extremities:   Extremities normal, atraumatic, no cyanosis or edema  Pulses:   2+ and symmetric all extremities  Skin:   Skin color, texture, turgor normal, no rashes or lesions     Assessment:    Healthy female exam.   h/o abnormal mammogram    Plan:   F/u 18months to recheck the vulva will review the GYN ONC notes to see if the area is consistent with where she had had her bx prev.    discontinue Premarin Estrogen patch 0.05mg  biweekly (SU/WED) Records from  prior GYN and GYN ONC  Grace Gregory, M.D., Grace Gregory

## 2017-12-21 DIAGNOSIS — J3089 Other allergic rhinitis: Secondary | ICD-10-CM | POA: Diagnosis not present

## 2017-12-21 DIAGNOSIS — J301 Allergic rhinitis due to pollen: Secondary | ICD-10-CM | POA: Diagnosis not present

## 2017-12-25 DIAGNOSIS — J3089 Other allergic rhinitis: Secondary | ICD-10-CM | POA: Diagnosis not present

## 2017-12-25 DIAGNOSIS — J301 Allergic rhinitis due to pollen: Secondary | ICD-10-CM | POA: Diagnosis not present

## 2017-12-27 DIAGNOSIS — J301 Allergic rhinitis due to pollen: Secondary | ICD-10-CM | POA: Diagnosis not present

## 2017-12-27 DIAGNOSIS — J3089 Other allergic rhinitis: Secondary | ICD-10-CM | POA: Diagnosis not present

## 2017-12-31 ENCOUNTER — Other Ambulatory Visit: Payer: Self-pay | Admitting: Family Medicine

## 2017-12-31 DIAGNOSIS — J301 Allergic rhinitis due to pollen: Secondary | ICD-10-CM

## 2017-12-31 DIAGNOSIS — J4530 Mild persistent asthma, uncomplicated: Secondary | ICD-10-CM

## 2018-01-01 MED FILL — MOMETASONE FUROATE 50 MCG S: 50 | 30 days supply | Qty: 17 | Fill #0

## 2018-01-02 DIAGNOSIS — J301 Allergic rhinitis due to pollen: Secondary | ICD-10-CM | POA: Diagnosis not present

## 2018-01-02 DIAGNOSIS — J3089 Other allergic rhinitis: Secondary | ICD-10-CM | POA: Diagnosis not present

## 2018-01-02 MED FILL — SYMBICORT 160-4.5 MCG INH: 160-4.5 | 30 days supply | Qty: 10 | Fill #1

## 2018-01-07 ENCOUNTER — Encounter: Payer: Self-pay | Admitting: Family Medicine

## 2018-01-08 DIAGNOSIS — J3089 Other allergic rhinitis: Secondary | ICD-10-CM | POA: Diagnosis not present

## 2018-01-08 DIAGNOSIS — J301 Allergic rhinitis due to pollen: Secondary | ICD-10-CM | POA: Diagnosis not present

## 2018-01-08 MED ORDER — HYDROCHLOROTHIAZIDE 12.5 MG PO TABS: 12.5000 mg | ORAL_TABLET | Freq: Every day | ORAL | 0 refills | Status: DC

## 2018-01-08 MED ORDER — GABAPENTIN 300 MG PO CAPS: 300.0000 mg | ORAL_CAPSULE | Freq: Two times a day (BID) | ORAL | 2 refills | Status: DC

## 2018-01-08 MED FILL — HYDROCHLOROTHIAZIDE 12.5 MG: 12.5 | 90 days supply | Qty: 90 | Fill #0

## 2018-01-08 MED FILL — GABAPENTIN 300 MG CAPSULE: 300 | 30 days supply | Qty: 60 | Fill #0

## 2018-01-11 MED FILL — ESTRADIOL 0.05 MG PATCH: 0.05 | 28 days supply | Qty: 8 | Fill #1

## 2018-01-15 DIAGNOSIS — J301 Allergic rhinitis due to pollen: Secondary | ICD-10-CM | POA: Diagnosis not present

## 2018-01-15 DIAGNOSIS — J3089 Other allergic rhinitis: Secondary | ICD-10-CM | POA: Diagnosis not present

## 2018-01-22 ENCOUNTER — Encounter: Payer: Self-pay | Admitting: Family Medicine

## 2018-01-22 DIAGNOSIS — J3089 Other allergic rhinitis: Secondary | ICD-10-CM | POA: Diagnosis not present

## 2018-01-22 DIAGNOSIS — J301 Allergic rhinitis due to pollen: Secondary | ICD-10-CM | POA: Diagnosis not present

## 2018-01-22 MED ORDER — OMEPRAZOLE 40 MG PO CPDR: 40.0000 mg | DELAYED_RELEASE_CAPSULE | Freq: Every day | ORAL | 1 refills | Status: DC

## 2018-01-22 MED FILL — OMEPRAZOLE 40 MG CPDR: 40 | 90 days supply | Qty: 90 | Fill #0

## 2018-01-29 DIAGNOSIS — J3089 Other allergic rhinitis: Secondary | ICD-10-CM | POA: Diagnosis not present

## 2018-01-29 DIAGNOSIS — J301 Allergic rhinitis due to pollen: Secondary | ICD-10-CM | POA: Diagnosis not present

## 2018-02-04 ENCOUNTER — Ambulatory Visit: Payer: 59 | Admitting: Family Medicine

## 2018-02-04 ENCOUNTER — Encounter: Payer: Self-pay | Admitting: Family Medicine

## 2018-02-04 VITALS — BP 130/80 | HR 90 | Ht 62.0 in | Wt 249.4 lb

## 2018-02-04 DIAGNOSIS — K140 Glossitis: Secondary | ICD-10-CM

## 2018-02-04 DIAGNOSIS — M5126 Other intervertebral disc displacement, lumbar region: Secondary | ICD-10-CM

## 2018-02-04 DIAGNOSIS — S39012A Strain of muscle, fascia and tendon of lower back, initial encounter: Secondary | ICD-10-CM | POA: Insufficient documentation

## 2018-02-04 DIAGNOSIS — M5136 Other intervertebral disc degeneration, lumbar region: Secondary | ICD-10-CM

## 2018-02-04 MED ORDER — METHOCARBAMOL 500 MG PO TABS: 500.0000 mg | ORAL_TABLET | Freq: Three times a day (TID) | ORAL | 0 refills | Status: DC

## 2018-02-04 MED ORDER — KETOROLAC TROMETHAMINE 60 MG/2ML IM SOLN
60.0000 mg | Freq: Once | INTRAMUSCULAR | Status: AC
  Administered 2018-02-04: 60 mg via INTRAMUSCULAR

## 2018-02-04 MED ORDER — MAGIC MOUTHWASH: 5.0000 mL | Freq: Four times a day (QID) | ORAL | 0 refills | Status: DC | PRN

## 2018-02-04 MED FILL — METHOCARBAMOL 500 MG TABLET: 500 | 20 days supply | Qty: 60 | Fill #0

## 2018-02-04 MED FILL — CMPD-MMW-NYST/GERI/DIPHEN: 10 days supply | Qty: 200 | Fill #0

## 2018-02-04 MED FILL — GABAPENTIN 300 MG CAPSULE: 300 | 30 days supply | Qty: 60 | Fill #1

## 2018-02-04 NOTE — Progress Notes (Signed)
Subjective:  Patient ID: Grace Gregory, female    DOB: 03-09-1961  Age: 57 y.o. MRN: [id number]  CC: Fall   HPI Grace Gregory presents for evaluation and treatment of left-sided back pain.  This happened the day following a misstep that had occurred the evening prior walking across bleachers at her high school reunion.  She reports left-sided back pain from the base of her neck down the side of her back into the lower back.  There is aching in the back of her left leg.  She has a past medical history of bulging disks.  She has tried Tylenol.  She is not having frank muscle spasms but feels stiffness when she goes to stand up or move.  There is no numbness tingling or weakness.  No change in her bowel habits.  Recent on set up irritation in her anterior tongue area.  She is using Symbicort for reactive airway disease and assures me that she is rinsing.  Outpatient Medications Prior to Visit  Medication Sig Dispense Refill  . budesonide-formoterol (SYMBICORT) 160-4.5 MCG/ACT inhaler Inhale 2 puffs into the lungs 2 (two) times daily.    . cetirizine (ZYRTEC) 10 MG tablet Take 10 mg by mouth daily.    Marland Kitchen estradiol (VIVELLE-DOT) 0.05 MG/24HR patch Place 1 patch (0.05 mg total) onto the skin 2 (two) times a week. 8 patch 4  . gabapentin (NEURONTIN) 300 MG capsule Take 1 capsule (300 mg total) by mouth 2 (two) times daily. 60 capsule 2  . hydrochlorothiazide (HYDRODIURIL) 12.5 MG tablet Take 1 tablet (12.5 mg total) by mouth daily. 90 tablet 0  . losartan (COZAAR) 50 MG tablet Take 1 tablet (50 mg total) by mouth daily. 90 tablet 1  . metoprolol succinate (TOPROL-XL) 25 MG 24 hr tablet Take 1 tablet (25 mg total) by mouth daily. 90 tablet 1  . mometasone (NASONEX) 50 MCG/ACT nasal spray PLACE 1 SPRAY IN EACH NOSTRIL. MAY INCREASE TO 2 SPRAYS IN EACH NOSTRIL AS TOLERATED 17 g 2  . montelukast (SINGULAIR) 10 MG tablet Take 1 tablet (10 mg total) by mouth daily. 90 tablet 2  . omeprazole  (PRILOSEC) 40 MG capsule Take 1 capsule (40 mg total) by mouth daily. 90 capsule 1  . PROAIR HFA 108 (90 Base) MCG/ACT inhaler Use as directed    . PREMARIN 1.25 MG tablet Take 1 tablet (1.25 mg total) by mouth daily. 30 tablet 0   No facility-administered medications prior to visit.     ROS Review of Systems  Constitutional: Negative for chills, diaphoresis, fatigue, fever and unexpected weight change.  HENT: Negative.  Negative for rhinorrhea, sinus pressure and sinus pain.   Respiratory: Negative.   Cardiovascular: Negative.   Gastrointestinal: Negative.   Musculoskeletal: Positive for back pain and myalgias.  Skin: Negative for pallor and rash.  Allergic/Immunologic: Negative for immunocompromised state.  Neurological: Negative for weakness and numbness.  Hematological: Does not bruise/bleed easily.  Psychiatric/Behavioral: Negative.     Objective:  BP 130/80   Pulse 90   Ht 5\' 2"  (1.575 m)   Wt 249 lb 6 oz (113.1 kg)   LMP  (LMP Unknown)   SpO2 97%   BMI 45.61 kg/m   BP Readings from Last 3 Encounters:  02/04/18 130/80  12/20/17 134/79  10/01/17 126/80    Wt Readings from Last 3 Encounters:  02/04/18 249 lb 6 oz (113.1 kg)  12/20/17 248 lb 1.3 oz (112.5 kg)  10/01/17 248 lb 2 oz (112.5  kg)    Physical Exam  Constitutional: She is oriented to person, place, and time. She appears well-developed and well-nourished. No distress.  HENT:  Head: Normocephalic and atraumatic.  Right Ear: External ear normal.  Left Ear: External ear normal.  Mouth/Throat:    Eyes: Right eye exhibits no discharge. Left eye exhibits no discharge. No scleral icterus.  Neck: No JVD present. No tracheal deviation present.  Pulmonary/Chest: Breath sounds normal.  Musculoskeletal:       Thoracic back: She exhibits normal range of motion, no swelling and no deformity.       Lumbar back: She exhibits decreased range of motion. She exhibits no bony tenderness and no deformity.    Neurological: She is alert and oriented to person, place, and time. She has normal strength.  Reflex Scores:      Patellar reflexes are 1+ on the right side and 1+ on the left side.      Achilles reflexes are 1+ on the right side and 1+ on the left side. Negative dural tension signs.    Skin: Skin is warm and dry. She is not diaphoretic.  Psychiatric: She has a normal mood and affect. Her behavior is normal.    Lab Results  Component Value Date   CHOL 172 01/20/2016   TRIG 109 01/20/2016   HDL 71 (A) 01/20/2016   LDLCALC 80 01/20/2016   ALT 18 01/20/2016   AST 25 01/20/2016   NA 143 01/20/2016   K 4.4 01/20/2016   CREATININE 0.7 01/20/2016   BUN 13 01/20/2016    Mm Diag Breast Tomo Uni Right  Result Date: 11/26/2017 CLINICAL DATA:  57 year old patient recalled from recent screening mammogram for evaluation of possible mass in the upper-outer right breast. EXAM: DIGITAL DIAGNOSTIC UNILATERAL RIGHT MAMMOGRAM WITH CAD AND TOMO COMPARISON:  Oct 12, 2017 ACR Breast Density Category b: There are scattered areas of fibroglandular density. FINDINGS: Whole breast CC and MLO views and exaggerated CC lateral view of the right breast with tomography are performed. These images confirm circumscribed oval masses with central fatty hila consistent with lymph nodes in the axillary tail of the right breast. The lymph nodes in the region of concern on recent screening mammogram measure 6 mm and 3 mm, respectively. No suspicious masses are identified. There is no architectural distortion in the right breast. Mammographic images were processed with CAD. IMPRESSION: Benign lymph nodes in the axillary tail of the right breast. No evidence of malignancy. RECOMMENDATION: Screening mammogram in one year.(Code:SM-B-01Y) I have discussed the findings and recommendations with the patient. Results were also provided in writing at the conclusion of the visit. If applicable, a reminder letter will be sent to the patient  regarding the next appointment. BI-RADS CATEGORY  2: Benign. Electronically Signed   By: Curlene Dolphin M.D.   On: 11/26/2017 11:14    Assessment & Plan:   Grace Gregory was seen today for fall.  Diagnoses and all orders for this visit:  Strain of lumbar region, initial encounter -     ketorolac (TORADOL) injection 60 mg -     methocarbamol (ROBAXIN) 500 MG tablet; Take 1 tablet (500 mg total) by mouth 3 (three) times daily.  Glossitis -     magic mouthwash SOLN; Take 5 mLs by mouth 4 (four) times daily as needed for mouth pain. Swish and expectorate.  Bulging of lumbar intervertebral disc -     Ambulatory referral to Orthopedic Surgery   I have discontinued Anzley L. Swoboda's PREMARIN.  I am also having her start on methocarbamol and magic mouthwash. Additionally, I am having her maintain her PROAIR HFA, cetirizine, metoprolol succinate, losartan, montelukast, budesonide-formoterol, estradiol, mometasone, hydrochlorothiazide, gabapentin, and omeprazole. We administered ketorolac.  Meds ordered this encounter  Medications  . ketorolac (TORADOL) injection 60 mg  . methocarbamol (ROBAXIN) 500 MG tablet    Sig: Take 1 tablet (500 mg total) by mouth 3 (three) times daily.    Dispense:  60 tablet    Refill:  0  . magic mouthwash SOLN    Sig: Take 5 mLs by mouth 4 (four) times daily as needed for mouth pain. Swish and expectorate.    Dispense:  200 mL    Refill:  0   She will use Magic mouthwash with diphenhydramine, Maalox and nystatin 1 teaspoon swish and expectorate 4 times daily D as needed.  She will start taking a multivitamin.  She will continue Tylenol for pain and Robaxin as needed for stiffness.  Patient requests referral to orthopedic care of her back pain.  Follow-up: Return if symptoms worsen or fail to improve.  Libby Maw, MD

## 2018-02-06 DIAGNOSIS — J301 Allergic rhinitis due to pollen: Secondary | ICD-10-CM | POA: Diagnosis not present

## 2018-02-06 DIAGNOSIS — J3089 Other allergic rhinitis: Secondary | ICD-10-CM | POA: Diagnosis not present

## 2018-02-08 ENCOUNTER — Ambulatory Visit (INDEPENDENT_AMBULATORY_CARE_PROVIDER_SITE_OTHER): Payer: 59

## 2018-02-08 DIAGNOSIS — Z23 Encounter for immunization: Secondary | ICD-10-CM | POA: Diagnosis not present

## 2018-02-08 NOTE — Progress Notes (Signed)
Pt presented today with husband for influenza vaccination. IM injection given in R deltoid. Pt tolerated injection well with no observed S/S prior to leaving office. Vaccination information given to pt.

## 2018-02-12 DIAGNOSIS — J3089 Other allergic rhinitis: Secondary | ICD-10-CM | POA: Diagnosis not present

## 2018-02-12 DIAGNOSIS — J301 Allergic rhinitis due to pollen: Secondary | ICD-10-CM | POA: Diagnosis not present

## 2018-02-12 MED FILL — ESTRADIOL 0.05 MG PATCH: 0.05 | 28 days supply | Qty: 8 | Fill #2

## 2018-02-19 DIAGNOSIS — J3089 Other allergic rhinitis: Secondary | ICD-10-CM | POA: Diagnosis not present

## 2018-02-19 DIAGNOSIS — J301 Allergic rhinitis due to pollen: Secondary | ICD-10-CM | POA: Diagnosis not present

## 2018-02-20 MED FILL — MOMETASONE FUROATE 50 MCG S: 50 | 30 days supply | Qty: 17 | Fill #1

## 2018-02-20 MED FILL — AZELASTINE HCL 137 MCG/SPRA: 137 | 25 days supply | Qty: 30 | Fill #1

## 2018-02-20 MED FILL — SYMBICORT 160-4.5 MCG INH: 160-4.5 | 30 days supply | Qty: 10 | Fill #2

## 2018-02-27 DIAGNOSIS — J301 Allergic rhinitis due to pollen: Secondary | ICD-10-CM | POA: Diagnosis not present

## 2018-02-27 DIAGNOSIS — J3089 Other allergic rhinitis: Secondary | ICD-10-CM | POA: Diagnosis not present

## 2018-03-04 MED FILL — GABAPENTIN 300 MG CAPSULE: 300 | 30 days supply | Qty: 60 | Fill #2

## 2018-03-04 MED FILL — MONTELUKAST SOD 10 MG TAB: 10 | 90 days supply | Qty: 90 | Fill #1

## 2018-03-05 DIAGNOSIS — J301 Allergic rhinitis due to pollen: Secondary | ICD-10-CM | POA: Diagnosis not present

## 2018-03-05 DIAGNOSIS — J3089 Other allergic rhinitis: Secondary | ICD-10-CM | POA: Diagnosis not present

## 2018-03-11 MED FILL — ESTRADIOL 0.05 MG PATCH: 0.05 | 28 days supply | Qty: 8 | Fill #3

## 2018-03-13 DIAGNOSIS — J3089 Other allergic rhinitis: Secondary | ICD-10-CM | POA: Diagnosis not present

## 2018-03-13 DIAGNOSIS — J301 Allergic rhinitis due to pollen: Secondary | ICD-10-CM | POA: Diagnosis not present

## 2018-03-18 DIAGNOSIS — J3089 Other allergic rhinitis: Secondary | ICD-10-CM | POA: Diagnosis not present

## 2018-03-18 DIAGNOSIS — J4540 Moderate persistent asthma, uncomplicated: Secondary | ICD-10-CM | POA: Diagnosis not present

## 2018-03-18 DIAGNOSIS — J301 Allergic rhinitis due to pollen: Secondary | ICD-10-CM | POA: Diagnosis not present

## 2018-03-22 ENCOUNTER — Ambulatory Visit (INDEPENDENT_AMBULATORY_CARE_PROVIDER_SITE_OTHER): Payer: Self-pay | Admitting: Orthopaedic Surgery

## 2018-03-26 DIAGNOSIS — J3089 Other allergic rhinitis: Secondary | ICD-10-CM | POA: Diagnosis not present

## 2018-03-26 DIAGNOSIS — J301 Allergic rhinitis due to pollen: Secondary | ICD-10-CM | POA: Diagnosis not present

## 2018-04-02 DIAGNOSIS — J301 Allergic rhinitis due to pollen: Secondary | ICD-10-CM | POA: Diagnosis not present

## 2018-04-02 DIAGNOSIS — J3089 Other allergic rhinitis: Secondary | ICD-10-CM | POA: Diagnosis not present

## 2018-04-03 ENCOUNTER — Other Ambulatory Visit: Payer: Self-pay | Admitting: Family Medicine

## 2018-04-03 DIAGNOSIS — J301 Allergic rhinitis due to pollen: Secondary | ICD-10-CM | POA: Diagnosis not present

## 2018-04-03 MED FILL — HYDROCHLOROTHIAZIDE 12.5 MG: 12.5 | 90 days supply | Qty: 90 | Fill #0

## 2018-04-03 MED FILL — METOPROLOL SUCCINATE ER 25: 25 | 90 days supply | Qty: 90 | Fill #1

## 2018-04-04 DIAGNOSIS — J3089 Other allergic rhinitis: Secondary | ICD-10-CM | POA: Diagnosis not present

## 2018-04-05 ENCOUNTER — Ambulatory Visit (INDEPENDENT_AMBULATORY_CARE_PROVIDER_SITE_OTHER): Payer: Self-pay | Admitting: Orthopaedic Surgery

## 2018-04-08 ENCOUNTER — Other Ambulatory Visit: Payer: Self-pay | Admitting: Family Medicine

## 2018-04-08 MED FILL — DOTTI 0.05 MG/24HR PTTW: 0.05 | 28 days supply | Qty: 8 | Fill #4

## 2018-04-08 MED FILL — GABAPENTIN 300 MG CAPSULE: 300 | 30 days supply | Qty: 60 | Fill #0

## 2018-04-09 DIAGNOSIS — J3089 Other allergic rhinitis: Secondary | ICD-10-CM | POA: Diagnosis not present

## 2018-04-09 DIAGNOSIS — J301 Allergic rhinitis due to pollen: Secondary | ICD-10-CM | POA: Diagnosis not present

## 2018-04-15 ENCOUNTER — Other Ambulatory Visit: Payer: Self-pay | Admitting: Family Medicine

## 2018-04-15 MED FILL — LOSARTAN POTASSIUM 50 MG TA: 50 | 90 days supply | Qty: 90 | Fill #0

## 2018-04-15 MED FILL — OMEPRAZOLE 40 MG CPDR: 40 | 90 days supply | Qty: 90 | Fill #1

## 2018-04-16 DIAGNOSIS — J3089 Other allergic rhinitis: Secondary | ICD-10-CM | POA: Diagnosis not present

## 2018-04-16 DIAGNOSIS — J301 Allergic rhinitis due to pollen: Secondary | ICD-10-CM | POA: Diagnosis not present

## 2018-04-22 ENCOUNTER — Ambulatory Visit: Payer: 59 | Admitting: Obstetrics & Gynecology

## 2018-04-22 MED FILL — SYMBICORT 160-4.5 MCG INH: 160-4.5 | 30 days supply | Qty: 10 | Fill #3

## 2018-04-22 MED FILL — AZELASTINE HCL 137 MCG/SPRA: 137 | 25 days supply | Qty: 30 | Fill #2

## 2018-04-23 MED FILL — MOMETASONE FUROATE 50 MCG S: 50 | 30 days supply | Qty: 17 | Fill #2

## 2018-04-25 DIAGNOSIS — J3089 Other allergic rhinitis: Secondary | ICD-10-CM | POA: Diagnosis not present

## 2018-04-25 DIAGNOSIS — J301 Allergic rhinitis due to pollen: Secondary | ICD-10-CM | POA: Diagnosis not present

## 2018-04-26 ENCOUNTER — Encounter (INDEPENDENT_AMBULATORY_CARE_PROVIDER_SITE_OTHER): Payer: Self-pay | Admitting: Orthopaedic Surgery

## 2018-04-26 ENCOUNTER — Ambulatory Visit (INDEPENDENT_AMBULATORY_CARE_PROVIDER_SITE_OTHER): Payer: 59

## 2018-04-26 ENCOUNTER — Ambulatory Visit (INDEPENDENT_AMBULATORY_CARE_PROVIDER_SITE_OTHER): Payer: 59 | Admitting: Orthopaedic Surgery

## 2018-04-26 VITALS — BP 155/90 | HR 65 | Ht 62.0 in | Wt 244.0 lb

## 2018-04-26 DIAGNOSIS — G8929 Other chronic pain: Secondary | ICD-10-CM

## 2018-04-26 DIAGNOSIS — M545 Low back pain: Secondary | ICD-10-CM

## 2018-04-26 NOTE — Progress Notes (Signed)
Office Visit Note   Patient: Grace Gregory           Date of Birth: [birth year]           MRN: [id number] Visit Date: 04/26/2018              Requested by: Libby Maw, MD [address] [place], [place] [address] PCP: Libby Maw, MD   Assessment & Plan: Visit Diagnoses:  1. Chronic low back pain, unspecified back pain laterality, unspecified whether sciatica present     Plan: Chronic low back pain with "flareups".  Films demonstrate significant degenerative disc disease at L5-S1.  Long discussion regarding her pain.  She can follow-up with Dr. Alfonso Ramus for medicines.  We may consider injections in the past.  Can even consider repeat MRI scan.  She is aware that she needs to lose weight and work on exercises Follow-Up Instructions: Return if symptoms worsen or fail to improve.   Orders:  Orders Placed This Encounter  Procedures  . XR Lumbar Spine 2-3 Views   No orders of the defined types were placed in this encounter.     Procedures: No procedures performed   Clinical Data: No additional findings.   Subjective: Chief Complaint  Patient presents with  . Spine - Pain  . Back Pain    lower back , pt fell a couple months ago,used iced and heat for pain.have some numbness in her legs at times   This is Grace Gregory is 57 years old visited the office for evaluation of chronic low back pain.  She had surgery by Dr. Trenton Gammon in 1997 and shortly thereafter moved to [facility].  She has been followed by a pain clinic with gabapentin is recently moved back to [facility] and notes that she has had several "flareups" of her back pain.  She has some chronic discomfort in her left leg that she has had even preceding her back surgery in 1997.  She remains on the gabapentin 300 mg p.o. twice daily per her primary care physician, Dr. Alfonso Ramus, in [place].  Presently she is doing well but when she does have discomfort is predominant lower part of her back.  She  fell in September with a flareup of her back pain which is to some extent resolved.  She has been busy lifting.  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  She does have discomfort she takes Motrin Tylenol uses ice or heat.  She does not have any bowel or bladder dysfunction.  She is not experiencing any pain in her right lower extremity.  She has a history of psoriatic arthritis but does not "take any medicines for that". Mrs. Grandt provide did multiple notes from 78fr pain clinic evaluations in [place] through [facility].  It appears that she has had multiple flareups of her back treated with tramadol and gabapentin no further surgery other than the 1997 annual L5-S1 hemilaminectomy on the left.  He had a follow-up MRI scan in 2016 that revealed left paracentral soft tissue density contacting the left S1 nerve root could result in radicular symptoms.  Probably related to scar tissue or recurrent disc protrusion.  At L4-5 she had broad-based central disc protrusion closely approximates the descending right L5 nerve root  HPI  Review of Systems  Musculoskeletal: Positive for back pain, gait problem and joint swelling.  Neurological: Positive for weakness.  Objective: Vital Signs: BP (!) 155/90   Pulse 65   Ht 5\' 2"  (1.575 m)   Wt 244 lb (110.7 kg)   LMP  (LMP Unknown)   BMI 44.63 kg/m   Physical Exam  Constitutional: She is oriented to person, place, and time. She appears well-developed and well-nourished.  HENT:  Mouth/Throat: Oropharynx is clear and moist.  Eyes: Pupils are equal, round, and reactive to light. EOM are normal.  Pulmonary/Chest: Effort normal.  Neurological: She is alert and oriented to person, place, and time.  Skin: Skin is warm and dry.  Psychiatric: She has a normal mood and affect. Her behavior is normal.    Ortho Exam awake alert and oriented x3.  Comfortable sitting.  Straight leg  raise negative bilaterally.  Painless range of motion both hips and both knees.  Neurologically intact.  Walks without a limp.  No percussible back pain or pain over the sacroiliac joints.  No leg pain  Specialty Comments:  No specialty comments available.  Imaging: No results found.   PMFS History: Patient Active Problem List   Diagnosis Date Noted  . Strain of lumbar region 02/04/2018  . Glossitis 10/01/2017  . Non-seasonal allergic rhinitis 06/27/2017  . Bulging of lumbar intervertebral disc 06/27/2017  . Reactive airway disease 06/27/2017  . Gastroesophageal reflux disease 06/27/2017  . Obstructive sleep apnea syndrome 06/27/2017  . Non-seasonal allergic rhinitis due to pollen 06/27/2017  . Essential hypertension 06/27/2017   Past Medical History:  Diagnosis Date  . Arthritis   . Arthritis   . GERD (gastroesophageal reflux disease)   . Hypertension     Family History  Problem Relation Age of Onset  . Hypertension Mother   . Heart disease Father   . Hypertension Sister   . Hypertension Brother   . Hypertension Sister   . Hypertension Sister   . Cancer Brother   . Hypertension Brother     Past Surgical History:  Procedure Laterality Date  . ABDOMINAL HYSTERECTOMY     Social History   Occupational History  . Not on file  Tobacco Use  . Smoking status: Never Smoker  . Smokeless tobacco: Never Used  Substance and Sexual Activity  . Alcohol use: Never    Frequency: Never  . Drug use: Never  . Sexual activity: Yes    Birth control/protection: None

## 2018-05-02 DIAGNOSIS — J301 Allergic rhinitis due to pollen: Secondary | ICD-10-CM | POA: Diagnosis not present

## 2018-05-02 DIAGNOSIS — J3089 Other allergic rhinitis: Secondary | ICD-10-CM | POA: Diagnosis not present

## 2018-05-06 ENCOUNTER — Other Ambulatory Visit: Payer: Self-pay

## 2018-05-06 DIAGNOSIS — Z780 Asymptomatic menopausal state: Secondary | ICD-10-CM

## 2018-05-06 MED ORDER — ESTRADIOL 0.05 MG/24HR TD PTTW: 1.0000 | MEDICATED_PATCH | TRANSDERMAL | 4 refills | Status: DC

## 2018-05-06 MED FILL — DOTTI 0.05 MG/24HR PTTW: 0.05 | 28 days supply | Qty: 8 | Fill #0

## 2018-05-07 DIAGNOSIS — J3089 Other allergic rhinitis: Secondary | ICD-10-CM | POA: Diagnosis not present

## 2018-05-07 DIAGNOSIS — J301 Allergic rhinitis due to pollen: Secondary | ICD-10-CM | POA: Diagnosis not present

## 2018-05-10 DIAGNOSIS — J3089 Other allergic rhinitis: Secondary | ICD-10-CM | POA: Diagnosis not present

## 2018-05-10 DIAGNOSIS — J301 Allergic rhinitis due to pollen: Secondary | ICD-10-CM | POA: Diagnosis not present

## 2018-05-13 MED FILL — GABAPENTIN 300 MG CAPSULE: 300 | 30 days supply | Qty: 60 | Fill #1

## 2018-05-16 DIAGNOSIS — J301 Allergic rhinitis due to pollen: Secondary | ICD-10-CM | POA: Diagnosis not present

## 2018-05-16 DIAGNOSIS — J3089 Other allergic rhinitis: Secondary | ICD-10-CM | POA: Diagnosis not present

## 2018-05-24 DIAGNOSIS — J301 Allergic rhinitis due to pollen: Secondary | ICD-10-CM | POA: Diagnosis not present

## 2018-05-24 DIAGNOSIS — J3089 Other allergic rhinitis: Secondary | ICD-10-CM | POA: Diagnosis not present

## 2018-05-29 DIAGNOSIS — J3089 Other allergic rhinitis: Secondary | ICD-10-CM | POA: Diagnosis not present

## 2018-05-29 DIAGNOSIS — J301 Allergic rhinitis due to pollen: Secondary | ICD-10-CM | POA: Diagnosis not present

## 2018-06-03 ENCOUNTER — Encounter: Payer: Self-pay | Admitting: Obstetrics & Gynecology

## 2018-06-03 ENCOUNTER — Ambulatory Visit (INDEPENDENT_AMBULATORY_CARE_PROVIDER_SITE_OTHER): Payer: 59 | Admitting: Obstetrics & Gynecology

## 2018-06-03 VITALS — BP 129/76 | HR 79 | Ht 62.0 in | Wt 243.1 lb

## 2018-06-03 DIAGNOSIS — N9089 Other specified noninflammatory disorders of vulva and perineum: Secondary | ICD-10-CM | POA: Diagnosis not present

## 2018-06-03 MED FILL — DOTTI 0.05 MG/24HR PTTW: 0.05 | 28 days supply | Qty: 8 | Fill #1

## 2018-06-03 NOTE — Patient Instructions (Signed)
Excision of Skin Lesions, Care After  This sheet gives you information about how to care for yourself after your procedure. Your health care provider may also give you more specific instructions. If you have problems or questions, contact your health care provider.  What can I expect after the procedure?  After your procedure, it is common to have pain or discomfort at the excision site.  Follow these instructions at home:  Excision care       · Follow instructions from your health care provider about how to take care of your excision site. Make sure you:  ? Wash your hands with soap and water before and after you change your bandage (dressing). If soap and water are not available, use hand sanitizer.  ? Change your dressing as told by your health care provider.  ? Leave stitches (sutures), skin glue, or adhesive strips in place. These skin closures may need to stay in place for 2 weeks or longer. If adhesive strip edges start to loosen and curl up, you may trim the loose edges. Do not remove adhesive strips completely unless your health care provider tells you to do that.  · Check the excision area every day for signs of infection. Watch for:  ? Redness, swelling, or pain.  ? Fluid or blood.  ? Warmth.  ? Pus or a bad smell.  · Keep the site clean, dry, and protected for at least 48 hours.  · For bleeding, apply gentle but firm pressure to the area using a folded towel for 20 minutes.  · Avoid high-impact exercise and activities until the sutures are removed or the area heals.  General instructions  · Take over-the-counter and prescription medicines only as told by your health care provider.  · Follow instructions from your health care provider about how to minimize scarring. Scarring should lessen over time.  · Avoid sun exposure until the area has healed. Use sunscreen to protect the area from the sun after it has healed.  · Keep all follow-up visits as told by your health care provider. This is  important.  Contact a health care provider if:  · You have redness, swelling, or pain around your excision site.  · You have fluid or blood coming from your excision site.  · Your excision site feels warm to the touch.  · You have pus or a bad smell coming from your excision site.  · You have a fever.  · You have pain that does not improve in 2-3 days after your procedure.  · You notice skin irregularities or changes in how you feel (sensation).  Summary  · This sheet of instructions provides you with information about caring for yourself after your procedure. Contact your health care provider if you have any problems or questions.  · Take over-the-counter and prescription medicines only as told by your health care provider.  · Change your dressing as told by your health care provider.  · Contact a health care provider if you have redness, swelling, pain, or other signs of infection around your excision site.  · Keep all follow-up visits as told by your health care provider. This is important.  This information is not intended to replace advice given to you by your health care provider. Make sure you discuss any questions you have with your health care provider.  Document Released: 09/22/2014 Document Revised: 11/14/2017 Document Reviewed: 11/14/2017  Elsevier Interactive Patient Education © 2019 [employer]   

## 2018-06-03 NOTE — Progress Notes (Signed)
History:  58 y.o. G1P0010 here today for reeval of vulvar lesion and ERT. Pt reports pain and burning at the lesion on her vulva. She reports that it hurts after intercourse and leads her to no even want to have intercourse.   Wants to discuss further eval/managemetn options.    The following portions of the patient's history were reviewed and updated as appropriate: allergies, current medications, past family history, past medical history, past social history, past surgical history and problem list.  Review of Systems:  Pertinent items are noted in HPI.    Objective:  Physical Exam Blood pressure 129/76, pulse 79, height 5\' 2"  (1.575 m), weight 243 lb 1.9 oz (110.3 kg).  CONSTITUTIONAL: Well-developed, well-nourished female in no acute distress.  HENT:  Normocephalic, atraumatic EYES: Conjunctivae and EOM are normal. No scleral icterus.  NECK: Normal range of motion SKIN: Skin is warm and dry. No rash noted. Not diaphoretic.No pallor. [place]: Alert and oriented to person, place, and time. Normal coordination.   Pelvic: external genitalia: there is an area of hyperpigmentation at the base of the introitus. Next to it on the left there is an area of hypopigmentation that is excoriated on the inside. This is the area that is causing sx. The area is ~1.5cm in diameter.       Assessment & Plan:  Vulvar lesion- concerned about a precancerous lesion. D/w pt bx vs excision. Pt requests excisional procedure.   Vulvar lesion excision (d/w pt outpt vs OR as pt is worried about pain. She   opts for in ofc procedure.   Still need records from prev bx  Total face-to-face time with patient was 15 min.  Greater than 50% was spent in counseling and coordination of care with the patient.      Pearlie Nies L. Harraway-Smith, M.D., [name]

## 2018-06-05 DIAGNOSIS — J3089 Other allergic rhinitis: Secondary | ICD-10-CM | POA: Diagnosis not present

## 2018-06-05 DIAGNOSIS — J301 Allergic rhinitis due to pollen: Secondary | ICD-10-CM | POA: Diagnosis not present

## 2018-06-14 DIAGNOSIS — J3089 Other allergic rhinitis: Secondary | ICD-10-CM | POA: Diagnosis not present

## 2018-06-14 DIAGNOSIS — J301 Allergic rhinitis due to pollen: Secondary | ICD-10-CM | POA: Diagnosis not present

## 2018-06-17 ENCOUNTER — Other Ambulatory Visit: Payer: Self-pay | Admitting: Family Medicine

## 2018-06-17 DIAGNOSIS — J301 Allergic rhinitis due to pollen: Secondary | ICD-10-CM

## 2018-06-17 DIAGNOSIS — J4530 Mild persistent asthma, uncomplicated: Secondary | ICD-10-CM

## 2018-06-17 MED FILL — GABAPENTIN 300 MG CAPSULE: 300 | 30 days supply | Qty: 60 | Fill #2

## 2018-06-17 MED FILL — MOMETASONE FUROATE 50 MCG S: 50 | 30 days supply | Qty: 17 | Fill #0

## 2018-06-17 MED FILL — MONTELUKAST SOD 10 MG TAB: 10 | 90 days supply | Qty: 90 | Fill #2

## 2018-06-18 DIAGNOSIS — J3089 Other allergic rhinitis: Secondary | ICD-10-CM | POA: Diagnosis not present

## 2018-06-18 DIAGNOSIS — J301 Allergic rhinitis due to pollen: Secondary | ICD-10-CM | POA: Diagnosis not present

## 2018-06-19 ENCOUNTER — Other Ambulatory Visit (HOSPITAL_COMMUNITY)
Admission: RE | Admit: 2018-06-19 | Discharge: 2018-06-19 | Disposition: A | Discharge status: Home / Self Care | Payer: 59 | Source: Ambulatory Visit | Attending: Obstetrics & Gynecology | Admitting: Obstetrics & Gynecology

## 2018-06-19 ENCOUNTER — Ambulatory Visit: Payer: 59 | Admitting: Obstetrics & Gynecology

## 2018-06-19 ENCOUNTER — Encounter: Payer: Self-pay | Admitting: Obstetrics & Gynecology

## 2018-06-19 VITALS — BP 122/49 | HR 65 | Ht 62.0 in | Wt 238.4 lb

## 2018-06-19 DIAGNOSIS — N9089 Other specified noninflammatory disorders of vulva and perineum: Secondary | ICD-10-CM | POA: Diagnosis not present

## 2018-06-19 DIAGNOSIS — D071 Carcinoma in situ of vulva: Secondary | ICD-10-CM | POA: Diagnosis not present

## 2018-06-19 NOTE — Progress Notes (Signed)
Pt presents for a vulvar excision. The indications and risks/complications of vulvar lesion excision were reviewed.   Risks of the excision including pain, bleeding, infection, further scarring including recurrent keloid formation and need for additional procedures  were discussed. The patient stated understanding and agreed to undergo procedure today. Consent was signed,  time out performed. The patient's left labia was cleaned with Betadine. 10cc 2% lidocaine was injected into the area.  An elliptical incision was made around each lesion.  The  biopsy tissue was picked up with sterile forceps and sterile scissors were used to excise the lesion.  the lesion was reapproximated with 3-0 vicryl extra in interrupted sutures.  The patient tolerated the procedure well. Post-procedure instructions  were given to the patient. The patient is to call for bleeding, fever greater than 100.4,  or other concerns.   Specimen sent to pathology.    Mirelle Biskup L. Harraway-Smith, M.D., [name]

## 2018-06-28 DIAGNOSIS — J3089 Other allergic rhinitis: Secondary | ICD-10-CM | POA: Diagnosis not present

## 2018-06-28 DIAGNOSIS — J301 Allergic rhinitis due to pollen: Secondary | ICD-10-CM | POA: Diagnosis not present

## 2018-07-01 MED FILL — DOTTI 0.05 MG/24HR PTTW: 0.05 | 28 days supply | Qty: 8 | Fill #2

## 2018-07-02 DIAGNOSIS — J301 Allergic rhinitis due to pollen: Secondary | ICD-10-CM | POA: Diagnosis not present

## 2018-07-02 DIAGNOSIS — J3089 Other allergic rhinitis: Secondary | ICD-10-CM | POA: Diagnosis not present

## 2018-07-03 ENCOUNTER — Encounter: Payer: Self-pay | Admitting: Obstetrics & Gynecology

## 2018-07-03 ENCOUNTER — Ambulatory Visit (INDEPENDENT_AMBULATORY_CARE_PROVIDER_SITE_OTHER): Payer: 59 | Admitting: Obstetrics & Gynecology

## 2018-07-03 VITALS — BP 140/80 | HR 81 | Ht 62.0 in | Wt 242.0 lb

## 2018-07-03 DIAGNOSIS — N903 Dysplasia of vulva, unspecified: Secondary | ICD-10-CM

## 2018-07-03 DIAGNOSIS — D071 Carcinoma in situ of vulva: Secondary | ICD-10-CM

## 2018-07-03 NOTE — Progress Notes (Addendum)
History:  58 y.o. G1P0010 here today for f/u of vulvar bx. Pt reports that she heard a 'pop' and thinks the sutures broke. She reports burning with urination at the bx site.   The following portions of the patient's history were reviewed and updated as appropriate: allergies, current medications, past family history, past medical history, past social history, past surgical history and problem list.  Review of Systems:  Pertinent items are noted in HPI.    Objective:  Physical Exam Blood pressure 140/80, pulse 81, height 5\' 2"  (1.575 m), weight 242 lb (109.8 kg).  CONSTITUTIONAL: Well-developed, well-nourished female in no acute distress.  HENT:  Normocephalic, atraumatic EYES: Conjunctivae and EOM are normal. No scleral icterus.  NECK: Normal range of motion SKIN: Skin is warm and dry. No rash noted. Not diaphoretic.No pallor. [place]: Alert and oriented to person, place, and time. Normal coordination.   Pelvic: Normal appearing external genitalia; the site of the bx is healing well but is open. There are no signs of infxn.   Labs and Imaging Diagnosis Vulva, excision - SEVERE SQUAMOUS DYSPLASIA (VIN-III).  Assessment & Plan:  Vulvar dysplasia  Called Pathology to confirm margins  F/u in 1 week or sooner prn  Total face-to-face time with patient was 15 min.  Greater than 50% was spent in counseling and coordination of care with the patient.   Shreya Lacasse L. Ihor Dow, M.D., [place]   07/08/2018 addendum Medical records from [facility] received and reviewed.  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

## 2018-07-08 ENCOUNTER — Encounter: Payer: Self-pay | Admitting: Obstetrics & Gynecology

## 2018-07-08 ENCOUNTER — Other Ambulatory Visit: Payer: Self-pay | Admitting: Family Medicine

## 2018-07-08 MED FILL — HYDROCHLOROTHIAZIDE 12.5 MG: 12.5 | 90 days supply | Qty: 90 | Fill #1

## 2018-07-08 MED FILL — METOPROLOL SUCCINATE ER 25: 25 | 90 days supply | Qty: 90 | Fill #0

## 2018-07-10 DIAGNOSIS — J3089 Other allergic rhinitis: Secondary | ICD-10-CM | POA: Diagnosis not present

## 2018-07-10 DIAGNOSIS — J301 Allergic rhinitis due to pollen: Secondary | ICD-10-CM | POA: Diagnosis not present

## 2018-07-11 ENCOUNTER — Telehealth: Payer: Self-pay | Admitting: Obstetrics & Gynecology

## 2018-07-11 NOTE — Telephone Encounter (Signed)
TC to pt to explain that her vulvar bx from 06/19/2018 reveal high grade dysplasia that extends to the margins.  She was previously made aware that it is high grade but, the pathologist had to make an addendum to determine the results of the margins. I have queried pt about a referral to GYN/ONC. I have also offered her topical treatment with Aldara. She reports that she has never been offered topical tx. She wants to discuss this with her husband and discuss further and decide at her visit next week.   Grace Gregory, M.D., [name]

## 2018-07-15 ENCOUNTER — Encounter: Payer: Self-pay | Admitting: Obstetrics & Gynecology

## 2018-07-15 ENCOUNTER — Telehealth: Payer: Self-pay | Admitting: *Deleted

## 2018-07-15 ENCOUNTER — Other Ambulatory Visit: Payer: Self-pay | Admitting: Family Medicine

## 2018-07-15 ENCOUNTER — Ambulatory Visit: Payer: 59 | Admitting: Obstetrics & Gynecology

## 2018-07-15 VITALS — BP 136/82 | HR 91 | Ht 62.0 in | Wt 240.0 lb

## 2018-07-15 DIAGNOSIS — D071 Carcinoma in situ of vulva: Secondary | ICD-10-CM

## 2018-07-15 MED FILL — LOSARTAN POTASSIUM 50 MG TA: 50 | 90 days supply | Qty: 90 | Fill #1

## 2018-07-15 MED FILL — GABAPENTIN 300 MG CAPSULE: 300 | 30 days supply | Qty: 60 | Fill #0

## 2018-07-15 NOTE — Patient Instructions (Signed)
Imiquimod skin cream What is this medicine? IMIQUIMOD (i mi KWI mod) cream is used to treat external genital or anal warts. It is also used to treat other skin conditions such as actinic keratosis and certain types of skin cancer. This medicine may be used for other purposes; ask your health care provider or pharmacist if you have questions. COMMON BRAND NAME(S): Tawni Levy What should I tell my health care provider before I take this medicine? They need to know if you have any of these conditions: -decreased immune function -an unusual or allergic reaction to imiquimod, other medicines, foods, dyes, or preservatives -pregnant or trying to get pregnant -breast-feeding How should I use this medicine? This medicine is for external use only. Do not take by mouth. Follow the directions on the prescription label. Apply just before bedtime. Wash your hands before and after use. Apply a thin layer of cream and massage gently into the affected areas until no longer visible. Do not use in the mouth, eyes or the vagina. Use this medicine only on the affected area as directed by your health care provider. Do not use for longer than prescribed. It is important not to use more medicine than prescribed. To do so may increase the chance of side effects. Talk to your pediatrician regarding the use of this medicine in children. While this drug may be prescribed for children as young as 92 years of age for selected conditions, precautions do apply. Overdosage: If you think you have taken too much of this medicine contact a poison control center or emergency room at once. NOTE: This medicine is only for you. Do not share this medicine with others. What if I miss a dose? If you miss a dose, use it as soon as you can. If it is almost time for your next dose, use only that dose. Do not use double or extra doses. What may interact with this medicine? Interactions are not expected. Do not use any other medicines on  the treated area without asking your doctor or health care professional. This list may not describe all possible interactions. Give your health care provider a list of all the medicines, herbs, non-prescription drugs, or dietary supplements you use. Also tell them if you smoke, drink alcohol, or use illegal drugs. Some items may interact with your medicine. What should I watch for while using this medicine? Visit your health care professional for regular checks on your progress. Do not use this medicine until the skin has healed from any other drug (example: podofilox or podophyllin resin) or surgical skin treatment. Females should receive regular pelvic exams while being treated for genital warts. Most patients see improvement within 4 weeks. It may take up to 16 weeks to see a full clearing of the warts. This medicine is not a cure. New warts may develop during or after treatment. Avoid sexual (genital, anal, oral) contact while the cream is on the skin. If warts are visible in the genital area, sexual contact should be avoided until the warts are treated. The use of latex condoms during sexual contact may reduce, but not entirely prevent, infecting others. This medicine may weaken condoms, diaphragms, cervical caps or other barrier devices and make them less effective as birth control. Do not cover the treated area with an airtight bandage. Cotton gauze dressings can be used. Cotton underwear can be worn after using this medicine on the genital or anal area. Actinic keratoses that were not seen before may appear during treatment and  be worn after using this medicine on the genital or anal area.  Actinic keratoses that were not seen before may appear during treatment and may later go away. The treatment area and surrounding area may lighten or darken after treatment with this medicine. These skin color changes may be permanent in some patients.  If you experience a skin reaction at the treatment site that interferes or prevents you from doing any daily activity, contact your health care provider. You may need a rest period from treatment.  Treatment may be restarted once the reaction has gotten better as recommended by your doctor or health care professional.  This medicine can make you more sensitive to the sun. Keep out of the sun. If you cannot avoid being in the sun, wear protective clothing and use sunscreen. Do not use sun lamps or tanning beds/booths.  What side effects may I notice from receiving this medicine?  Side effects that you should report to your doctor or health care professional as soon as possible:  -open sores with or without drainage  -skin infection  -skin rash  -unusual or severe skin reaction  Side effects that usually do not require medical attention (report to your doctor or health care professional if they continue or are bothersome):  -burning or itching  -redness of the skin (very common but is usually not painful or harmful)  -scabbing, crusting, or peeling skin  -skin that becomes hard or thickened  -swelling of the skin  This list may not describe all possible side effects. Call your doctor for medical advice about side effects. You may report side effects to FDA at 1-800-FDA-1088.  Where should I keep my medicine?  Keep out of the reach of children.  Store between 4 and 25 degrees C (39 and 77 degrees F). Do not freeze. Throw away any unused medicine after the expiration date. Discard packet after applying to affected area. Partial packets should not be saved or reused.  NOTE: This sheet is a summary. It may not cover all possible information. If you have questions about this medicine, talk to your doctor, pharmacist, or health care provider.   2019 Elsevier/Gold Standard (2008-04-21 10:33:25)

## 2018-07-15 NOTE — Progress Notes (Signed)
History:  58 y.o. G1P0010 here today for f/u after vulvar biopsy. Pt reports continued itching of the prev resection site. Pt is begin managed for persistent VIN. The pathology report final revealed extension of disease into all of the margins. After our discssion last week pt and her husband agree to go to [place] for GYN consult. She is also considering Imiquimod.    The following portions of the patient's history were reviewed and updated as appropriate: allergies, current medications, past family history, past medical history, past social history, past surgical history and problem list.  Review of Systems:  Pertinent items are noted in HPI.    Objective:  Physical Exam Blood pressure 136/82, pulse 91, height 5\' 2"  (1.575 m), weight 240 lb (108.9 kg).  CONSTITUTIONAL: Well-developed, well-nourished female in no acute distress.  HENT:  Normocephalic, atraumatic EYES: Conjunctivae and EOM are normal. No scleral icterus.  NECK: Normal range of motion SKIN: Skin is warm and dry. No rash noted. Not diaphoretic.No pallor. Groom: Alert and oriented to person, place, and time. Normal coordination.   Pelvic: excision site healing well. The are still hyperpigmented lesion at the introitus. No acetowhite lesion noted at present.   Labs and Imaging 06/19/2018 ADDITIONAL INFORMATION: High grade squamous dysplasia extends to multiple non-oriented lateral edges. Dr. Ihor Dow was paged on 07/09/2018. (JBK:kh 07/09/2018) Enid Cutter MD Pathologist, Electronic Signature ( Signed 07/09/2018) FINAL DIAGNOSIS Diagnosis Vulva, excision - SEVERE SQUAMOUS DYSPLASIA (VIN-III).  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 Reviewed prior GYN records .  Rec consult to Dr. Denman George with [facility] for recommendations. Hy has  not had medical treatment. I have discussed Imiquimod  with her but, want  her to first meet with [facility] to see if another recommendation is  warranted.    Appt for  next Friday Mar 6th with Dr. Denman George  Total face-to-face time with patient was 15 min.  Greater than 50% was spent in counseling and coordination of care with the patient. All questions answered.   Hilliard Borges L. Harraway-Smith, M.D., [name]

## 2018-07-15 NOTE — Telephone Encounter (Signed)
A nurse from Dr. Vladimir Creeks office called and scheduled a patient to see Dr. Denman George. The patient's appt is 3/6 at 11:30 am

## 2018-07-18 ENCOUNTER — Other Ambulatory Visit: Payer: Self-pay | Admitting: Family Medicine

## 2018-07-18 DIAGNOSIS — J301 Allergic rhinitis due to pollen: Secondary | ICD-10-CM | POA: Diagnosis not present

## 2018-07-18 DIAGNOSIS — J3089 Other allergic rhinitis: Secondary | ICD-10-CM | POA: Diagnosis not present

## 2018-07-18 MED FILL — OMEPRAZOLE 40 MG CPDR: 40 | 90 days supply | Qty: 90 | Fill #0

## 2018-07-24 DIAGNOSIS — J3089 Other allergic rhinitis: Secondary | ICD-10-CM | POA: Diagnosis not present

## 2018-07-24 DIAGNOSIS — J301 Allergic rhinitis due to pollen: Secondary | ICD-10-CM | POA: Diagnosis not present

## 2018-07-24 MED FILL — SYMBICORT 160-4.5 MCG INH: 160-4.5 | 30 days supply | Qty: 10 | Fill #4

## 2018-07-26 ENCOUNTER — Encounter: Payer: Self-pay | Admitting: Gynecologic Oncology

## 2018-07-26 ENCOUNTER — Other Ambulatory Visit: Payer: Self-pay

## 2018-07-26 ENCOUNTER — Inpatient Hospital Stay: Payer: 59 | Attending: Gynecologic Oncology | Admitting: Gynecologic Oncology

## 2018-07-26 VITALS — BP 124/67 | HR 69 | Temp 98.1°F | Resp 18 | Ht 62.0 in | Wt 237.0 lb

## 2018-07-26 DIAGNOSIS — I10 Essential (primary) hypertension: Secondary | ICD-10-CM | POA: Diagnosis not present

## 2018-07-26 DIAGNOSIS — D071 Carcinoma in situ of vulva: Secondary | ICD-10-CM | POA: Diagnosis not present

## 2018-07-26 DIAGNOSIS — L4050 Arthropathic psoriasis, unspecified: Secondary | ICD-10-CM | POA: Insufficient documentation

## 2018-07-26 DIAGNOSIS — K219 Gastro-esophageal reflux disease without esophagitis: Secondary | ICD-10-CM | POA: Insufficient documentation

## 2018-07-26 NOTE — Patient Instructions (Addendum)
Plan to have a wide local excision of the vulva at the [facility] on August 06, 2018.  You will receive a phone call from the pre-surgical RN to discuss instructions.  Please call for any questions or concerns.  Vulvectomy  Vulvectomy is a surgical procedure to remove all or part of the outer female genital organs (vulva). The vulva includes the outer and inner lips of the vagina and the clitoris. You may need this surgery if you have a cancerous growth in your vulva. There are two types of vulvectomy:  A simple vulvectomy. This is the removal of the entire vulva.  A radical vulvectomy. A radical vulvectomy can be partial or complete. ? A partial radical vulvectomy is when part of the vulva and surrounding deep tissue is removed. ? A complete radical vulvectomy is when the vulva, clitoris, and surrounding deep tissue is removed. During a radical vulvectomy, some lymph nodes near the vulva may also be removed. Tell a health care provider about:  Any allergies you have.  All medicines you are taking, including vitamins, herbs, eye drops, creams, and over-the-counter medicines.  Any problems you or family members have had with anesthetic medicines.  Any blood disorders you have.  Any surgeries you have had.  Any medical conditions you have.  Whether you are pregnant or may be pregnant. What are the risks? Generally, this is a safe procedure. However, problems may occur, including:  Infection.  Bleeding.  Allergic reactions to medicines.  Damage to other structures or organs.  Urinary tract infections.  Lymphedema. This is when your legs swell after the removal of lymph nodes from your groin area.  Pain or decreased sexual pleasure when having sex.  Long-term vaginal swelling, tightness, numbness, or pain.  A blood clot that may travel to the lung (pulmonary embolism). What happens before the procedure?  Follow instructions from your health care  provider about eating or drinking restrictions.  Ask your health care provider about: ? Changing or stopping your regular medicines. This is especially important if you are taking diabetes medicines or blood thinners. ? Taking medicines such as aspirin and ibuprofen. These medicines can thin your blood. Do not take these medicines before your procedure if your health care provider instructs you not to.  Ask your health care provider how your surgical site will be marked or identified.  You may be given antibiotic medicine to help prevent infection.  Plan to have someone take you home after the procedure.  If you will be going home right after the procedure, plan to have someone with you for 24 hours. What happens during the procedure?  To reduce your risk of infection: ? Your health care team will wash or sanitize their hands. ? Your skin will be washed with soap.  An IV tube will be inserted into one of your veins.  You will be given one or more of the following: ? A medicine to help you relax (sedative). ? A medicine to make you fall asleep (general anesthetic). ? A medicine that is injected into your spine to numb the area below and slightly above the injection site (spinal anesthetic).  A tube (catheter) may be inserted through the outer opening of your bladder (urethra) to drain urine during and after surgery.  Depending on the type of vulvectomy you are having, your surgeon will make an incision and remove the affected area. This may include: ? Removing the entire vulva. ? Removing part of the vulva,  surrounding deep tissue, and lymph nodes. ? Removing the vulva, clitoris, surrounding deep tissue, and lymph nodes.  If your lymph nodes are removed, a drain may be placed in the area to help avoid fluid buildup.  Your incisions will be closed and covered with a bandage (dressing). The procedure may vary among health care providers and hospitals. What happens after the  procedure?  Your blood pressure, heart rate, breathing rate, and blood oxygen level will be monitored often until the medicines you were given have worn off.  You will get medicine for pain as needed.  You may get medicine to prevent constipation.  You may be on a liquid diet at first, and then switch to a regular diet.  When you are taking fluids well, your IV will be removed.  If your catheter was left in place after surgery, it will be removed when your health care provider approves.  You will be asked to breathe deeply and to get out of bed and walk as soon as you can.  You may have to wear compression stockings. These stockings help to prevent blood clots and reduce swelling in your legs.  Do not drive for 24 hours if you received a sedative. This information is not intended to replace advice given to you by your health care provider. Make sure you discuss any questions you have with your health care provider. Document Released: 06/04/2015 Document Revised: 10/14/2015 Document Reviewed: 05/03/2015 Elsevier Interactive Patient Education  2019 [employer].

## 2018-07-26 NOTE — Progress Notes (Signed)
Consult Note: Gyn-Onc  Consult was requested by Dr. Ihor Dow for the evaluation of Grace Gregory 58 y.o. female  CC:  Chief Complaint  Patient presents with  . VIN 3    Assessment/Plan:  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  The patient has a chronic history of the condition and has had multiple prior procedures for cervical and vulvar dysplasia. 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  On examination this is a small lesion on the left posterior vulva. I feel that primary excision (wide local excision) is a good option.   I discussed postoperative healing and risks to healing including wound separation and infection. Surgery is scheduled for 08/06/18.  HPI: [name] is a 58 year old woman with psoriatic arthritis and a longstanding history of vulvar dysplasia. She is seen in consultation at the request of Dr Ihor Dow for VIN 3.  The patient last had a procedure for VIN approximately 3 years ago.  She has developed recent vulvar pruritus and was seen by Dr. Ihor Dow for this who identified an area on the introitus that was hyperpigmented which he then biopsied on June 19, 2018.  The final pathology revealed high-grade squamous dysplasia.  The patient has a personal history of psoriatic arthritis.  She does not take steroids or immunosuppressive's for this. She has a remote history of cervical cryotherapy.  She reports that recent Pap smears have been normal.  She has had multiple procedures on the vulva including laser and excision.  Most of her prior treatments were in [place].  Current Meds:  Outpatient Encounter Medications as of 07/26/2018  Medication Sig  . azelastine (ASTELIN) 0.1 % nasal spray Place into both nostrils 2 (two) times daily. Use in each nostril as directed  . budesonide-formoterol (SYMBICORT) 160-4.5 MCG/ACT inhaler Inhale 2 puffs into the lungs 2 (two) times daily.  . cetirizine (ZYRTEC) 10  MG tablet Take 10 mg by mouth daily.  Marland Kitchen estradiol (VIVELLE-DOT) 0.05 MG/24HR patch Place 1 patch (0.05 mg total) onto the skin 2 (two) times a week.  . gabapentin (NEURONTIN) 300 MG capsule TAKE 1 CAPSULE BY MOUTH TWICE DAILY  . hydrochlorothiazide (HYDRODIURIL) 12.5 MG tablet TAKE 1 TABLET BY MOUTH ONCE DAILY  . losartan (COZAAR) 50 MG tablet TAKE 1 TABLET BY MOUTH ONCE DAILY  . metoprolol succinate (TOPROL-XL) 25 MG 24 hr tablet TAKE 1 TABLET BY MOUTH ONCE DAILY  . mometasone (NASONEX) 50 MCG/ACT nasal spray PLACE 1 SPRAY IN EACH NOSTRIL. MAY INCREASE TO 2 SPRAYS IN EACH NOSTRIL AS TOLERATED  . montelukast (SINGULAIR) 10 MG tablet Take 1 tablet (10 mg total) by mouth daily.  Marland Kitchen omeprazole (PRILOSEC) 40 MG capsule TAKE 1 CAPSULE (40 MG TOTAL) BY MOUTH DAILY.  Marland Kitchen PROAIR HFA 108 (90 Base) MCG/ACT inhaler Use as directed  . magic mouthwash SOLN Take 5 mLs by mouth 4 (four) times daily as needed for mouth pain. Swish and expectorate.   No facility-administered encounter medications on file as of 07/26/2018.     Allergy:  Allergies  Allergen Reactions  . Penicillins Rash    Social Hx:   Social History   Socioeconomic History  . Marital status: Married    Spouse name: Not on file  . Number of children: Not on file  . Years of education: Not on file  . Highest education level: Not on file  Occupational History  . Not on file  Social Needs  .  Financial resource strain: Not on file  . Food insecurity:    Worry: Not on file    Inability: Not on file  . Transportation needs:    Medical: Not on file    Non-medical: Not on file  Tobacco Use  . Smoking status: Never Smoker  . Smokeless tobacco: Never Used  Substance and Sexual Activity  . Alcohol use: Never    Frequency: Never  . Drug use: Never  . Sexual activity: Yes    Birth control/protection: None  Lifestyle  . Physical activity:    Days per week: Not on file    Minutes per session: Not on file  . Stress: Not on file   Relationships  . Social connections:    Talks on phone: Not on file    Gets together: Not on file    Attends religious service: Not on file    Active member of club or organization: Not on file    Attends meetings of clubs or organizations: Not on file    Relationship status: Not on file  . Intimate partner violence:    Fear of current or ex partner: Not on file    Emotionally abused: Not on file    Physically abused: Not on file    Forced sexual activity: Not on file  Other Topics Concern  . Not on file  Social History Narrative  . Not on file    Past Surgical Hx:  Past Surgical History:  Procedure Laterality Date  . ABDOMINAL HYSTERECTOMY      Past Medical Hx:  Past Medical History:  Diagnosis Date  . Arthritis   . Arthritis   . GERD (gastroesophageal reflux disease)   . Hypertension     Past Gynecological History:  G0, Hx of abnormal paps and cervical cryotherapy, hx of VIN3.  No LMP recorded (lmp unknown). Patient has had a hysterectomy.  Family Hx:  Family History  Problem Relation Age of Onset  . Hypertension Mother   . Heart disease Father   . Hypertension Sister   . Hypertension Brother   . Hypertension Sister   . Hypertension Sister   . Cancer Brother   . Hypertension Brother     Review of Systems:  Constitutional  Feels well,   ENT Normal appearing ears and nares bilaterally Skin/Breast  No rash, sores, jaundice, itching, dryness Cardiovascular  No chest pain, shortness of breath, or edema  Pulmonary  No cough or wheeze.  Gastro Intestinal  No nausea, vomitting, or diarrhoea. No bright red blood per rectum, no abdominal pain, change in bowel movement, or constipation.  Genito Urinary  No frequency, urgency, dysuria, no bleeding. Musculo Skeletal  No myalgia, arthralgia, joint swelling or pain  Neurologic  No weakness, numbness, change in gait,  Psychology  No depression, anxiety, insomnia.   Vitals:  Blood pressure 124/67, pulse 69,  temperature 98.1 F (36.7 C), temperature source Oral, resp. rate 18, height 5\' 2"  (1.575 m), weight 237 lb (107.5 kg), SpO2 97 %.  Physical Exam: WD in NAD Neck  Supple NROM, without any enlargements.  Lymph Node Survey No cervical supraclavicular or inguinal adenopathy Cardiovascular  Pulse normal rate, regularity and rhythm. S1 and S2 normal.  Lungs  Clear to auscultation bilateraly, without wheezes/crackles/rhonchi. Good air movement.  Skin  No rash/lesions/breakdown  Psychiatry  Alert and oriented to person, place, and time  Abdomen  Normoactive bowel sounds, abdomen soft, non-tender and obese without evidence of hernia.  Back No CVA tenderness Genito  Urinary  Vulva/vagina: 4% acetic acid applied. A 2cm acetowhite area was identified on the left vaginal introitus consistent with biopsy site. No other lesions seen on the vulva.  Bladder/urethra:  No lesions or masses, well supported bladder  Vagina: norma  Cervix: Normal appearing, no lesions.  Uterus:  Small, mobile, no parametrial involvement or nodularity.  Adnexa: no palpable masses. Rectal  deferred Extremities  No bilateral cyanosis, clubbing or edema.   Thereasa Solo, MD  07/26/2018, 12:22 PM

## 2018-07-26 NOTE — H&P (View-Only) (Signed)
Consult Note: Gyn-Onc  Consult was requested by Dr. Ihor Dow for the evaluation of Grace Gregory 58 y.o. female  CC:  Chief Complaint  Patient presents with  . VIN 3    Assessment/Plan:  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  The patient has a chronic history of the condition and has had multiple prior procedures for cervical and vulvar dysplasia. 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  On examination this is a small lesion on the left posterior vulva. I feel that primary excision (wide local excision) is a good option.   I discussed postoperative healing and risks to healing including wound separation and infection. Surgery is scheduled for 08/06/18.  HPI: [name] is a 58 year old woman with psoriatic arthritis and a longstanding history of vulvar dysplasia. She is seen in consultation at the request of Dr Ihor Dow for VIN 3.  The patient last had a procedure for VIN approximately 3 years ago.  She has developed recent vulvar pruritus and was seen by Dr. Ihor Dow for this who identified an area on the introitus that was hyperpigmented which he then biopsied on June 19, 2018.  The final pathology revealed high-grade squamous dysplasia.  The patient has a personal history of psoriatic arthritis.  She does not take steroids or immunosuppressive's for this. She has a remote history of cervical cryotherapy.  She reports that recent Pap smears have been normal.  She has had multiple procedures on the vulva including laser and excision.  Most of her prior treatments were in [place].  Current Meds:  Outpatient Encounter Medications as of 07/26/2018  Medication Sig  . azelastine (ASTELIN) 0.1 % nasal spray Place into both nostrils 2 (two) times daily. Use in each nostril as directed  . budesonide-formoterol (SYMBICORT) 160-4.5 MCG/ACT inhaler Inhale 2 puffs into the lungs 2 (two) times daily.  . cetirizine (ZYRTEC) 10  MG tablet Take 10 mg by mouth daily.  Marland Kitchen estradiol (VIVELLE-DOT) 0.05 MG/24HR patch Place 1 patch (0.05 mg total) onto the skin 2 (two) times a week.  . gabapentin (NEURONTIN) 300 MG capsule TAKE 1 CAPSULE BY MOUTH TWICE DAILY  . hydrochlorothiazide (HYDRODIURIL) 12.5 MG tablet TAKE 1 TABLET BY MOUTH ONCE DAILY  . losartan (COZAAR) 50 MG tablet TAKE 1 TABLET BY MOUTH ONCE DAILY  . metoprolol succinate (TOPROL-XL) 25 MG 24 hr tablet TAKE 1 TABLET BY MOUTH ONCE DAILY  . mometasone (NASONEX) 50 MCG/ACT nasal spray PLACE 1 SPRAY IN EACH NOSTRIL. MAY INCREASE TO 2 SPRAYS IN EACH NOSTRIL AS TOLERATED  . montelukast (SINGULAIR) 10 MG tablet Take 1 tablet (10 mg total) by mouth daily.  Marland Kitchen omeprazole (PRILOSEC) 40 MG capsule TAKE 1 CAPSULE (40 MG TOTAL) BY MOUTH DAILY.  Marland Kitchen PROAIR HFA 108 (90 Base) MCG/ACT inhaler Use as directed  . magic mouthwash SOLN Take 5 mLs by mouth 4 (four) times daily as needed for mouth pain. Swish and expectorate.   No facility-administered encounter medications on file as of 07/26/2018.     Allergy:  Allergies  Allergen Reactions  . Penicillins Rash    Social Hx:   Social History   Socioeconomic History  . Marital status: Married    Spouse name: Not on file  . Number of children: Not on file  . Years of education: Not on file  . Highest education level: Not on file  Occupational History  . Not on file  Social Needs  .  Financial resource strain: Not on file  . Food insecurity:    Worry: Not on file    Inability: Not on file  . Transportation needs:    Medical: Not on file    Non-medical: Not on file  Tobacco Use  . Smoking status: Never Smoker  . Smokeless tobacco: Never Used  Substance and Sexual Activity  . Alcohol use: Never    Frequency: Never  . Drug use: Never  . Sexual activity: Yes    Birth control/protection: None  Lifestyle  . Physical activity:    Days per week: Not on file    Minutes per session: Not on file  . Stress: Not on file   Relationships  . Social connections:    Talks on phone: Not on file    Gets together: Not on file    Attends religious service: Not on file    Active member of club or organization: Not on file    Attends meetings of clubs or organizations: Not on file    Relationship status: Not on file  . Intimate partner violence:    Fear of current or ex partner: Not on file    Emotionally abused: Not on file    Physically abused: Not on file    Forced sexual activity: Not on file  Other Topics Concern  . Not on file  Social History Narrative  . Not on file    Past Surgical Hx:  Past Surgical History:  Procedure Laterality Date  . ABDOMINAL HYSTERECTOMY      Past Medical Hx:  Past Medical History:  Diagnosis Date  . Arthritis   . Arthritis   . GERD (gastroesophageal reflux disease)   . Hypertension     Past Gynecological History:  G0, Hx of abnormal paps and cervical cryotherapy, hx of VIN3.  No LMP recorded (lmp unknown). Patient has had a hysterectomy.  Family Hx:  Family History  Problem Relation Age of Onset  . Hypertension Mother   . Heart disease Father   . Hypertension Sister   . Hypertension Brother   . Hypertension Sister   . Hypertension Sister   . Cancer Brother   . Hypertension Brother     Review of Systems:  Constitutional  Feels well,   ENT Normal appearing ears and nares bilaterally Skin/Breast  No rash, sores, jaundice, itching, dryness Cardiovascular  No chest pain, shortness of breath, or edema  Pulmonary  No cough or wheeze.  Gastro Intestinal  No nausea, vomitting, or diarrhoea. No bright red blood per rectum, no abdominal pain, change in bowel movement, or constipation.  Genito Urinary  No frequency, urgency, dysuria, no bleeding. Musculo Skeletal  No myalgia, arthralgia, joint swelling or pain  Neurologic  No weakness, numbness, change in gait,  Psychology  No depression, anxiety, insomnia.   Vitals:  Blood pressure 124/67, pulse 69,  temperature 98.1 F (36.7 C), temperature source Oral, resp. rate 18, height 5\' 2"  (1.575 m), weight 237 lb (107.5 kg), SpO2 97 %.  Physical Exam: WD in NAD Neck  Supple NROM, without any enlargements.  Lymph Node Survey No cervical supraclavicular or inguinal adenopathy Cardiovascular  Pulse normal rate, regularity and rhythm. S1 and S2 normal.  Lungs  Clear to auscultation bilateraly, without wheezes/crackles/rhonchi. Good air movement.  Skin  No rash/lesions/breakdown  Psychiatry  Alert and oriented to person, place, and time  Abdomen  Normoactive bowel sounds, abdomen soft, non-tender and obese without evidence of hernia.  Back No CVA tenderness Genito  Urinary  Vulva/vagina: 4% acetic acid applied. A 2cm acetowhite area was identified on the left vaginal introitus consistent with biopsy site. No other lesions seen on the vulva.  Bladder/urethra:  No lesions or masses, well supported bladder  Vagina: norma  Cervix: Normal appearing, no lesions.  Uterus:  Small, mobile, no parametrial involvement or nodularity.  Adnexa: no palpable masses. Rectal  deferred Extremities  No bilateral cyanosis, clubbing or edema.   Thereasa Solo, MD  07/26/2018, 12:22 PM

## 2018-07-29 MED FILL — DOTTI 0.05 MG/24HR PTTW: 0.05 | 28 days supply | Qty: 8 | Fill #3

## 2018-07-31 ENCOUNTER — Other Ambulatory Visit: Payer: Self-pay

## 2018-07-31 ENCOUNTER — Encounter (HOSPITAL_BASED_OUTPATIENT_CLINIC_OR_DEPARTMENT_OTHER): Payer: Self-pay | Admitting: *Deleted

## 2018-07-31 DIAGNOSIS — J301 Allergic rhinitis due to pollen: Secondary | ICD-10-CM | POA: Diagnosis not present

## 2018-07-31 DIAGNOSIS — J3089 Other allergic rhinitis: Secondary | ICD-10-CM | POA: Diagnosis not present

## 2018-07-31 NOTE — Progress Notes (Signed)
Spoke with patient via telephone for pre op interview. NPO after MN. Patient to take Metoprolol, Prilosec, Neurontin, and Zyrtec AM of surgery. Will need BMET and EKG. Arrival time 0945.

## 2018-08-05 DIAGNOSIS — J301 Allergic rhinitis due to pollen: Secondary | ICD-10-CM | POA: Diagnosis not present

## 2018-08-05 DIAGNOSIS — J3089 Other allergic rhinitis: Secondary | ICD-10-CM | POA: Diagnosis not present

## 2018-08-06 ENCOUNTER — Encounter (HOSPITAL_BASED_OUTPATIENT_CLINIC_OR_DEPARTMENT_OTHER)
Admission: RE | Disposition: A | Discharge status: Home / Self Care | Payer: Self-pay | Source: Home / Self Care | Attending: Gynecologic Oncology

## 2018-08-06 ENCOUNTER — Ambulatory Visit (HOSPITAL_BASED_OUTPATIENT_CLINIC_OR_DEPARTMENT_OTHER): Payer: 59 | Admitting: Anesthesiology

## 2018-08-06 ENCOUNTER — Encounter (HOSPITAL_BASED_OUTPATIENT_CLINIC_OR_DEPARTMENT_OTHER): Payer: Self-pay | Admitting: Gynecologic Oncology

## 2018-08-06 ENCOUNTER — Other Ambulatory Visit: Payer: Self-pay

## 2018-08-06 ENCOUNTER — Ambulatory Visit (HOSPITAL_BASED_OUTPATIENT_CLINIC_OR_DEPARTMENT_OTHER)
Admission: RE | Admit: 2018-08-06 | Discharge: 2018-08-06 | Disposition: A | Discharge status: Home / Self Care | Payer: 59 | Attending: Gynecologic Oncology | Admitting: Gynecologic Oncology

## 2018-08-06 DIAGNOSIS — D071 Carcinoma in situ of vulva: Secondary | ICD-10-CM | POA: Diagnosis not present

## 2018-08-06 DIAGNOSIS — K219 Gastro-esophageal reflux disease without esophagitis: Secondary | ICD-10-CM | POA: Diagnosis not present

## 2018-08-06 DIAGNOSIS — G4733 Obstructive sleep apnea (adult) (pediatric): Secondary | ICD-10-CM | POA: Diagnosis not present

## 2018-08-06 DIAGNOSIS — Z79890 Hormone replacement therapy (postmenopausal): Secondary | ICD-10-CM | POA: Diagnosis not present

## 2018-08-06 DIAGNOSIS — Z7951 Long term (current) use of inhaled steroids: Secondary | ICD-10-CM | POA: Insufficient documentation

## 2018-08-06 DIAGNOSIS — I10 Essential (primary) hypertension: Secondary | ICD-10-CM | POA: Insufficient documentation

## 2018-08-06 DIAGNOSIS — Z79899 Other long term (current) drug therapy: Secondary | ICD-10-CM | POA: Diagnosis not present

## 2018-08-06 DIAGNOSIS — L4050 Arthropathic psoriasis, unspecified: Secondary | ICD-10-CM | POA: Diagnosis not present

## 2018-08-06 DIAGNOSIS — Z880 Allergy status to penicillin: Secondary | ICD-10-CM | POA: Insufficient documentation

## 2018-08-06 DIAGNOSIS — Z6841 Body Mass Index (BMI) 40.0 and over, adult: Secondary | ICD-10-CM | POA: Diagnosis not present

## 2018-08-06 DIAGNOSIS — G4730 Sleep apnea, unspecified: Secondary | ICD-10-CM | POA: Insufficient documentation

## 2018-08-06 HISTORY — PX: VULVECTOMY: SHX1086

## 2018-08-06 HISTORY — DX: Sleep apnea, unspecified: G47.30

## 2018-08-06 HISTORY — DX: Other seasonal allergic rhinitis: J30.2

## 2018-08-06 LAB — BASIC METABOLIC PANEL
Anion gap: 8 (ref 5–15)
BUN: 14 mg/dL (ref 6–20)
CO2: 27 mmol/L (ref 22–32)
Calcium: 9.2 mg/dL (ref 8.9–10.3)
Chloride: 102 mmol/L (ref 98–111)
Creatinine, Ser: 0.85 mg/dL (ref 0.44–1.00)
GFR calc Af Amer: >60 mL/min (ref >60)
Glucose, Bld: 166 mg/dL — ABNORMAL HIGH (ref 70–99)
Potassium: 4.7 mmol/L (ref 3.5–5.1)
Sodium: 137 mmol/L (ref 135–145)

## 2018-08-06 SURGERY — WIDE EXCISION VULVECTOMY
Anesthesia: General | Site: Vulva

## 2018-08-06 MED ORDER — MIDAZOLAM HCL 2 MG/2ML IJ SOLN
INTRAMUSCULAR | Status: AC
  Filled 2018-08-06: qty 2

## 2018-08-06 MED ORDER — BUPIVACAINE LIPOSOME 1.3 % IJ SUSP
INTRAMUSCULAR | Status: DC | PRN
  Administered 2018-08-06: 20 mL

## 2018-08-06 MED ORDER — LACTATED RINGERS IV SOLN
INTRAVENOUS | Status: DC
  Administered 2018-08-06: 11:00:00 via INTRAVENOUS
  Filled 2018-08-06: qty 1000

## 2018-08-06 MED ORDER — OXYCODONE HCL 5 MG PO TABS: 5.0000 mg | ORAL_TABLET | ORAL | 0 refills | Status: DC | PRN

## 2018-08-06 MED ORDER — PROMETHAZINE HCL 25 MG/ML IJ SOLN
6.2500 mg | INTRAMUSCULAR | Status: DC | PRN
  Filled 2018-08-06: qty 1

## 2018-08-06 MED ORDER — FENTANYL CITRATE (PF) 100 MCG/2ML IJ SOLN
INTRAMUSCULAR | Status: AC
  Filled 2018-08-06: qty 2

## 2018-08-06 MED ORDER — ACETAMINOPHEN 500 MG PO TABS
1000.0000 mg | ORAL_TABLET | Freq: Once | ORAL | Status: AC
  Administered 2018-08-06: 1000 mg via ORAL
  Filled 2018-08-06: qty 2

## 2018-08-06 MED ORDER — MIDAZOLAM HCL 5 MG/5ML IJ SOLN
INTRAMUSCULAR | Status: DC | PRN
  Administered 2018-08-06: 1 mg via INTRAVENOUS

## 2018-08-06 MED ORDER — SCOPOLAMINE 1 MG/3DAYS TD PT72
MEDICATED_PATCH | TRANSDERMAL | Status: AC
  Filled 2018-08-06: qty 1

## 2018-08-06 MED ORDER — FENTANYL CITRATE (PF) 100 MCG/2ML IJ SOLN
INTRAMUSCULAR | Status: DC | PRN
  Administered 2018-08-06 (×2): 50 ug via INTRAVENOUS

## 2018-08-06 MED ORDER — SENNA 8.6 MG PO TABS: 1.0000 | ORAL_TABLET | Freq: Every day | ORAL | 0 refills | Status: DC

## 2018-08-06 MED ORDER — PROPOFOL 10 MG/ML IV BOLUS
INTRAVENOUS | Status: AC
  Filled 2018-08-06: qty 20

## 2018-08-06 MED ORDER — DEXAMETHASONE SODIUM PHOSPHATE 10 MG/ML IJ SOLN
INTRAMUSCULAR | Status: AC
  Filled 2018-08-06: qty 1

## 2018-08-06 MED ORDER — IBUPROFEN 800 MG PO TABS: 800.0000 mg | ORAL_TABLET | Freq: Three times a day (TID) | ORAL | 0 refills | Status: DC | PRN

## 2018-08-06 MED ORDER — SCOPOLAMINE 1 MG/3DAYS TD PT72
1.0000 | MEDICATED_PATCH | Freq: Once | TRANSDERMAL | Status: DC
  Administered 2018-08-06: 1.5 mg via TRANSDERMAL
  Filled 2018-08-06: qty 1

## 2018-08-06 MED ORDER — FENTANYL CITRATE (PF) 100 MCG/2ML IJ SOLN
25.0000 ug | INTRAMUSCULAR | Status: DC | PRN
  Filled 2018-08-06: qty 1

## 2018-08-06 MED ORDER — ACETAMINOPHEN 500 MG PO TABS
ORAL_TABLET | ORAL | Status: AC
  Filled 2018-08-06: qty 2

## 2018-08-06 MED ORDER — ACETIC ACID 5 % SOLN
Status: DC | PRN
  Administered 2018-08-06: 1 via TOPICAL

## 2018-08-06 MED ORDER — PROPOFOL 10 MG/ML IV BOLUS
INTRAVENOUS | Status: DC | PRN
  Administered 2018-08-06: 200 mg via INTRAVENOUS

## 2018-08-06 MED ORDER — DEXAMETHASONE SODIUM PHOSPHATE 10 MG/ML IJ SOLN
INTRAMUSCULAR | Status: DC | PRN
  Administered 2018-08-06: 4 mg via INTRAVENOUS

## 2018-08-06 MED ORDER — BUPIVACAINE HCL 0.25 % IJ SOLN
INTRAMUSCULAR | Status: DC | PRN
  Administered 2018-08-06: 30 mL

## 2018-08-06 MED ORDER — ONDANSETRON HCL 4 MG/2ML IJ SOLN
INTRAMUSCULAR | Status: DC | PRN
  Administered 2018-08-06: 4 mg via INTRAVENOUS

## 2018-08-06 MED ORDER — GABAPENTIN 300 MG PO CAPS
300.0000 mg | ORAL_CAPSULE | Freq: Once | ORAL | Status: DC
  Filled 2018-08-06: qty 1

## 2018-08-06 MED ORDER — LIDOCAINE HCL (CARDIAC) PF 100 MG/5ML IV SOSY
PREFILLED_SYRINGE | INTRAVENOUS | Status: DC | PRN
  Administered 2018-08-06: 60 mg via INTRAVENOUS

## 2018-08-06 MED ORDER — ONDANSETRON HCL 4 MG/2ML IJ SOLN
INTRAMUSCULAR | Status: AC
  Filled 2018-08-06: qty 2

## 2018-08-06 MED FILL — IBUPROFEN 800 MG TABS: 800 | 10 days supply | Qty: 30 | Fill #0

## 2018-08-06 MED FILL — oxyCODONE HCL 5 MG TABS: 5 | 2 days supply | Qty: 15 | Fill #0

## 2018-08-06 MED FILL — SENNA 8.6 MG TABS: 8.6 | 100 days supply | Qty: 100 | Fill #0

## 2018-08-06 SURGICAL SUPPLY — 26 items
BLADE CLIPPER SENSICLIP SURGIC (BLADE) IMPLANT
BLADE SURG 15 STRL LF DISP TIS (BLADE) ×1 IMPLANT
BLADE SURG 15 STRL SS (BLADE) ×3
CANISTER SUCT 3000ML PPV (MISCELLANEOUS) ×3 IMPLANT
CATH ROBINSON RED A/P 14FR (CATHETERS) ×3 IMPLANT
COVER WAND RF STERILE (DRAPES) ×3 IMPLANT
GAUZE 4X4 16PLY RFD (DISPOSABLE) ×3 IMPLANT
GLOVE BIO SURGEON STRL SZ 6 (GLOVE) ×6 IMPLANT
GOWN STRL REUS W/TWL LRG LVL3 (GOWN DISPOSABLE) ×3 IMPLANT
KIT TURNOVER CYSTO (KITS) ×3 IMPLANT
NDL HYPO 25X1 1.5 SAFETY (NEEDLE) ×1 IMPLANT
NEEDLE HYPO 25X1 1.5 SAFETY (NEEDLE) ×3 IMPLANT
NS IRRIG 500ML POUR BTL (IV SOLUTION) ×3 IMPLANT
PACK PERINEAL COLD (PAD) ×3 IMPLANT
PACK VAGINAL WOMENS (CUSTOM PROCEDURE TRAY) ×3 IMPLANT
SUT VIC AB 0 SH 27 (SUTURE) ×3 IMPLANT
SUT VIC AB 2-0 CT2 27 (SUTURE) IMPLANT
SUT VIC AB 2-0 SH 27 (SUTURE)
SUT VIC AB 2-0 SH 27XBRD (SUTURE) IMPLANT
SUT VIC AB 3-0 SH 27 (SUTURE) ×3
SUT VIC AB 3-0 SH 27X BRD (SUTURE) ×1 IMPLANT
SUT VICRYL 4-0 PS2 18IN ABS (SUTURE) ×3 IMPLANT
SWAB OB GYN 8IN STERILE 2PK (MISCELLANEOUS) IMPLANT
SYR BULB IRRIGATION 50ML (SYRINGE) ×3 IMPLANT
TOWEL OR 17X26 10 PK STRL BLUE (TOWEL DISPOSABLE) ×6 IMPLANT
WATER STERILE IRR 500ML POUR (IV SOLUTION) IMPLANT

## 2018-08-06 NOTE — Transfer of Care (Signed)
Immediate Anesthesia Transfer of Care Note  Patient: Grace Gregory  Procedure(s) Performed: WIDE LOCAL EXCISION VULVA (N/A Vulva)  Patient Location: PACU  Anesthesia Type:General  Level of Consciousness: awake, alert  and oriented  Airway & Oxygen Therapy: Patient Spontanous Breathing and Patient connected to nasal cannula oxygen  Post-op Assessment: Report given to RN and Post -op Vital signs reviewed and stable  Post vital signs: Reviewed and stable  Last Vitals:  Vitals Value Taken Time  BP    Temp    Pulse    Resp    SpO2      Last Pain:  Vitals:   08/06/18 1004  TempSrc:   PainSc: 0-No pain      Patients Stated Pain Goal: 7 (54/30/14 8403)  Complications: No apparent anesthesia complications

## 2018-08-06 NOTE — Anesthesia Preprocedure Evaluation (Addendum)
Anesthesia Evaluation  Patient identified by MRN, date of birth, ID band Patient awake    Reviewed: Allergy & Precautions, NPO status , Patient's Chart, lab work & pertinent test results, reviewed documented beta blocker date and time   Airway Mallampati: II  TM Distance: >3 FB Neck ROM: Full    Dental  (+) Teeth Intact, Dental Advisory Given   Pulmonary sleep apnea and Continuous Positive Airway Pressure Ventilation ,    Pulmonary exam normal breath sounds clear to auscultation       Cardiovascular hypertension, Pt. on medications and Pt. on home beta blockers Normal cardiovascular exam Rhythm:Regular Rate:Normal     Neuro/Psych negative neurological ROS  negative psych ROS   GI/Hepatic Neg liver ROS, GERD  Medicated and Controlled,  Endo/Other  Morbid obesity  Renal/GU negative Renal ROS     Musculoskeletal  (+) Arthritis ,   Abdominal   Peds  Hematology negative hematology ROS (+)   Anesthesia Other Findings Day of surgery medications reviewed with the patient.  Reproductive/Obstetrics                            Anesthesia Physical Anesthesia Plan  ASA: III  Anesthesia Plan: General   Post-op Pain Management:    Induction: Intravenous  PONV Risk Score and Plan: 4 or greater and Scopolamine patch - Pre-op, Midazolam, Dexamethasone and Ondansetron  Airway Management Planned: LMA  Additional Equipment:   Intra-op Plan:   Post-operative Plan: Extubation in OR  Informed Consent: I have reviewed the patients History and Physical, chart, labs and discussed the procedure including the risks, benefits and alternatives for the proposed anesthesia with the patient or authorized representative who has indicated his/her understanding and acceptance.     Dental advisory given  Plan Discussed with: CRNA  Anesthesia Plan Comments:         Anesthesia Quick Evaluation

## 2018-08-06 NOTE — Discharge Instructions (Signed)
Vulvectomy, Care After The vulva is the external female genitalia, outside and around the vagina and pubic bone. It consists of:  The skin on, and in front of, the pubic bone.  The clitoris.  The labia majora (large lips) on the outside of the vagina.  The labia minora (small lips) around the opening of the vagina.  The opening and the skin in and around the vagina. A vulvectomy is the removal of the tissue of the vulva, which sometimes includes removal of the lymph nodes and tissue in the groin areas. These discharge instructions provide you with general information on caring for yourself after you leave the hospital. It is also important that you know the warning signs of complications, so that you can seek treatment. Please read the instructions outlined below and refer to this sheet in the next few weeks. Your caregiver may also give you specific information and medicines. If you have any questions or complications after discharge, please call your caregiver. ACTIVITY  Rest as much as possible the first two weeks after discharge.  Arrange to have help from family or others with your daily activities when you go home.  Avoid heavy lifting (more than 5 pounds), pushing, or pulling.  If you feel tired, balance your activity with rest periods.  Follow your caregiver's instruction about climbing stairs and driving a car.  Increase activity gradually.  Do not exercise until you have permission from your caregiver. NUTRITION  You may resume your normal diet.  Drink 6 to 8 glasses of fluids a day.  Eat a healthy, balanced diet including portions of food from the meat (protein), milk, fruit, vegetable, and bread groups.  Your caregiver may recommend you take a multivitamin with iron. ELIMINATION  You may notice that your stream of urine is at a different angle, and may tend to spray. Using a plastic funnel may help to decrease urine spray.  If constipation occurs, drink more  liquids, and add more fruits, vegetables, and bran to your diet. You may take a mild laxative, such as Milk of Magnesia, Metamucil, or a stool softener such as Colace, with permission from your caregiver. HYGIENE  You may shower and wash your hair.  Check with your caregiver about tub baths.  Do not add any bath oils or chemicals to your bath water, after you have permission to take baths.  While passing urine, pour water from a bottle or spray over your vulva to dilute the urine as it passes the incision (this will decrease burning and discomfort).  Clean yourself well after moving your bowels.  After urinating, do not wipe. Dap or pat dry with toilet paper or a dry cleath soft cloth.  A sitz bath will help keep your perineal area clean, reduce swelling, and provide comfort.  Avoid wearing underpants for the first 2 weeks and wear loose skirts to allow circulation of air around the incision  You do not need to apply dressings, salves or lotions to the wound.  The stitches are self-dissolving and will absorb and disappear over a couple of months (it is normal to notice the knot from the stitches on toilet paper after voiding). HOME CARE INSTRUCTIONS   Apply a soft ice pack (or frozen bag of peas) to your perineum (vulva) every hour in the first 48 hours after surgery. This will reduce swelling.  Avoid activities that involve a lot of friction between your legs.  Avoid wearing pants or underpants in the 1st 2 weeks (skirts are  preferable).  Take your temperature twice a day and record it, especially if you feel feverish or have chills.  Follow your caregiver's instructions about medicines, activity, and follow-up appointments after surgery.  Do not drink alcohol while taking pain medicine.  Change your dressing as advised by your caregiver.  You may take over-the-counter medicine for pain, recommended by your caregiver.  If your pain is not relieved with medicine, call your  caregiver.  Do not take aspirin because it can cause bleeding.  Do not douche or use tampons (use a nonperfumed sanitary pad).  Do not have sexual intercourse until your caregiver gives you permission (typically 6 weeks postoperatively). Hugging, kissing, and playful sexual activity is fine with your caregiver's permission.  Warm sitz baths, with your caregiver's permission, are helpful to control swelling and discomfort.  Take showers instead of baths, until your caregiver gives you permission to take baths.  You may take a mild medicine for constipation, recommended by your caregiver. Bran foods and drinking a lot of fluids will help with constipation.  Make sure your family understands everything about your operation and recovery. SEEK MEDICAL CARE IF:   You notice swelling and redness around the wound area.  You notice a foul smell coming from the wound or on the surgical dressing.  You notice the wound is separating.  You have painful or bloody urination.  You develop nausea and vomiting.  You develop diarrhea.  You develop a rash.  You have a reaction or allergy from the medicine.  You feel dizzy or light-headed.  You need stronger pain medicine. SEEK IMMEDIATE MEDICAL CARE IF:   You develop a temperature of 102 F (38.9 C) or higher.  You pass out.  You develop leg or chest pain.  You develop abdominal pain.  You develop shortness of breath.  You develop bleeding from the wound area.  You see pus in the wound area. MAKE SURE YOU:   Understand these instructions.  Will watch your condition.  Will get help right away if you are not doing well or get worse. Document Released: 12/21/2003 Document Revised: 09/22/2013 Document Reviewed: 04/09/2009 [facility] Patient Information 2015 [place], [place]. This information is not intended to replace advice given to you by your health care provider. Make sure you discuss any questions you have with your health care  provider.   Post Anesthesia Home Care Instructions  Activity: Get plenty of rest for the remainder of the day. A responsible individual must stay with you for 24 hours following the procedure.  For the next 24 hours, DO NOT: -Drive a car -Paediatric nurse -Drink alcoholic beverages -Take any medication unless instructed by your physician -Make any legal decisions or sign important papers.  Meals: Start with liquid foods such as gelatin or soup. Progress to regular foods as tolerated. Avoid greasy, spicy, heavy foods. If nausea and/or vomiting occur, drink only clear liquids until the nausea and/or vomiting subsides. Call your physician if vomiting continues.  Special Instructions/Symptoms: Your throat may feel dry or sore from the anesthesia or the breathing tube placed in your throat during surgery. If this causes discomfort, gargle with warm salt water. The discomfort should disappear within 24 hours.  If you had a scopolamine patch placed behind your ear for the management of post- operative nausea and/or vomiting:  1. The medication in the patch is effective for 72 hours, after which it should be removed.  Wrap patch in a tissue and discard in the trash. Wash hands thoroughly  with soap and water. 2. You may remove the patch earlier than 72 hours if you experience unpleasant side effects which may include dry mouth, dizziness or visual disturbances. 3. Avoid touching the patch. Wash your hands with soap and water after contact with the patch.     Information for Discharge Teaching: EXPAREL (bupivacaine liposome injectable suspension)   Your surgeon or anesthesiologist gave you EXPAREL(bupivacaine) to help control your pain after surgery.   EXPAREL is a local anesthetic that provides pain relief by numbing the tissue around the surgical site.  EXPAREL is designed to release pain medication over time and can control pain for up to 72 hours.  Depending on how you respond to  EXPAREL, you may require less pain medication during your recovery.  Possible side effects:  Temporary loss of sensation or ability to move in the area where bupivacaine was injected.  Nausea, vomiting, constipation  Rarely, numbness and tingling in your mouth or lips, lightheadedness, or anxiety may occur.  Call your doctor right away if you think you may be experiencing any of these sensations, or if you have other questions regarding possible side effects.  Follow all other discharge instructions given to you by your surgeon or nurse. Eat a healthy diet and drink plenty of water or other fluids.  If you return to the hospital for any reason within 96 hours following the administration of EXPAREL, it is important for health care providers to know that you have received this anesthetic. A teal colored band has been placed on your arm with the date, time and amount of EXPAREL you have received in order to alert and inform your health care providers. Please leave this armband in place for the full 96 hours following administration, and then you may remove the band.  May take green bracelet off Sun. Morning  Tylenol may be taken after 4:30 pm if needed

## 2018-08-06 NOTE — Interval H&P Note (Signed)
History and Physical Interval Note:  08/06/2018 10:52 AM  Grace Gregory  has presented today for surgery, with the diagnosis of VULVAR DYSPLASIA.  The various methods of treatment have been discussed with the patient and family. After consideration of risks, benefits and other options for treatment, the patient has consented to  Procedure(s): WIDE EXCISION VULVECTOMY (N/A) as a surgical intervention.  The patient's history has been reviewed, patient examined, no change in status, stable for surgery.  I have reviewed the patient's chart and labs.  Questions were answered to the patient's satisfaction.     Thereasa Solo

## 2018-08-06 NOTE — Anesthesia Procedure Notes (Signed)
Procedure Name: LMA Insertion Date/Time: 08/06/2018 11:21 AM Performed by: Jonna Munro, CRNA Pre-anesthesia Checklist: Patient identified, Emergency Drugs available, Suction available, Patient being monitored and Timeout performed Patient Re-evaluated:Patient Re-evaluated prior to induction Oxygen Delivery Method: Circle system utilized Preoxygenation: Pre-oxygenation with 100% oxygen Induction Type: IV induction LMA: LMA with gastric port inserted LMA Size: 4.0 Number of attempts: 1 Placement Confirmation: positive ETCO2 and breath sounds checked- equal and bilateral Dental Injury: Teeth and Oropharynx as per pre-operative assessment

## 2018-08-06 NOTE — Op Note (Signed)
PATIENT: Grace Gregory DATE: 08/06/18   Preop Diagnosis: VIN 3  Postoperative Diagnosis: same  Surgery: Partial simple partial left vulvectomy  Surgeons:  Donaciano Eva, MD Assistant: none  Anesthesia: General   Estimated blood loss: <22ml  IVF:  265ml   Urine output: 50 ml   Complications: None   Pathology: left posterior vulva with marking stitch at 12 o'clock  Operative findings: 2x2cm area of acetowhite changes at the left posterior introitus.   Procedure: The patient was identified in the preoperative holding area. Informed consent was signed on the chart. Patient was seen history was reviewed and exam was performed.   The patient was then taken to the operating room and placed in the supine position with SCD hose on. General anesthesia was then induced without difficulty. She was then placed in the dorsolithotomy position. The perineum was prepped with Betadine. The vagina was prepped with Betadine. The patient was then draped after the prep was dried. A Foley catheter was inserted into the bladder under sterile conditions.  Timeout was performed the patient, procedure, antibiotic, allergy, and length of procedure. 5% acetic acid solution was applied to the perineum. The vulvar tissues were inspected for areas of acetowhite changes or leukoplakia. The lesion was identified and the marking pen was used to circumscribe the area with appropriate surgical margins. The subcuticular tissues were infiltrated with exparel and 0.25% marcaine. The 15 blade scalpel was used to make an incision through the skin circumferentially as marked. The skin elipse was grasped and was separated from the underlying deep dermal tissues with the bovie device. After the specimen had been completely resected, it was oriented and marked at 12 o'clock with a 0-vicryl suture. The bovie was used to obtain hemostasis at the surgical bed. The subcutaneous tissues were irrigated and made  hemostatic.   The deep dermal layer was approximated with 3-0vicryl mattress sutures to bring the skin edges into approximation and off tension. The wound was closed following langher's lines. The cutaneous layer was closed with interrupted 4-0 vicryl running stitches and mattress sutures to ensure a tension free and hemostatic closure. The perineum was again irrigated. The foley was removed.  All instrument, suture, laparotomy, Ray-Tec, and needle counts were correct x2. The patient tolerated the procedure well and was taken recovery room in stable condition. This is Grace Gregory dictating an operative note on Grace Gregory.

## 2018-08-06 NOTE — Anesthesia Postprocedure Evaluation (Signed)
Anesthesia Post Note  Patient: Grace Gregory  Procedure(s) Performed: WIDE LOCAL EXCISION VULVA (N/A Vulva)     Patient location during evaluation: PACU Anesthesia Type: General Level of consciousness: awake and alert, awake and oriented Pain management: pain level controlled Vital Signs Assessment: post-procedure vital signs reviewed and stable Respiratory status: spontaneous breathing, nonlabored ventilation and respiratory function stable Cardiovascular status: blood pressure returned to baseline and stable Postop Assessment: no apparent nausea or vomiting Anesthetic complications: no    Last Vitals:  Vitals:   08/06/18 1300 08/06/18 1353  BP: 127/75 (!) 142/72  Pulse: (!) 56 62  Resp: 13 18  Temp:    SpO2: 93% 98%    Last Pain:  Vitals:   08/06/18 1300  TempSrc:   PainSc: 0-No pain                 Catalina Gravel

## 2018-08-07 ENCOUNTER — Encounter: Payer: Self-pay | Admitting: Family Medicine

## 2018-08-07 ENCOUNTER — Encounter (HOSPITAL_BASED_OUTPATIENT_CLINIC_OR_DEPARTMENT_OTHER): Payer: Self-pay | Admitting: Gynecologic Oncology

## 2018-08-09 ENCOUNTER — Telehealth: Payer: Self-pay

## 2018-08-09 NOTE — Telephone Encounter (Signed)
Told Ms Filsinger that the surgical pathology showed the VIN III which is pre cancer but no cancer. The margins were clear. Pt doing very well postoperatively.  She has not needed to using the narcotic pain medication just ibuprofen

## 2018-08-12 ENCOUNTER — Encounter: Payer: Self-pay | Admitting: Gynecologic Oncology

## 2018-08-12 ENCOUNTER — Telehealth: Payer: Self-pay | Admitting: Gynecologic Oncology

## 2018-08-12 NOTE — Telephone Encounter (Signed)
Returned call to patient.   She states her stitches opened up.  No blood reported and little drainage noted. No fever or chills or increased pain in the area.  Her husband has visualized the incision. No signs of infection reported at this time.  Reportable signs and symptoms reviewed. Incision care instructions reviewed.  Advised to call with any concerns or questions.

## 2018-08-13 ENCOUNTER — Telehealth: Payer: Self-pay | Admitting: *Deleted

## 2018-08-13 MED FILL — GABAPENTIN 300 MG CAPSULE: 300 | 30 days supply | Qty: 60 | Fill #1

## 2018-08-13 NOTE — Telephone Encounter (Signed)
Called and spoke with the patient regarding her appt for 4/1. Moved her appt time from 2:15pm to 1:15pm, advised no visitors policy

## 2018-08-15 ENCOUNTER — Encounter: Payer: Self-pay | Admitting: Gynecologic Oncology

## 2018-08-15 ENCOUNTER — Other Ambulatory Visit: Payer: Self-pay | Admitting: Gynecologic Oncology

## 2018-08-15 DIAGNOSIS — G8918 Other acute postprocedural pain: Secondary | ICD-10-CM

## 2018-08-15 MED ORDER — OXYCODONE HCL 5 MG PO TABS: 5.0000 mg | ORAL_TABLET | ORAL | 0 refills | Status: DC | PRN

## 2018-08-15 MED ORDER — IBUPROFEN 800 MG PO TABS: 800.0000 mg | ORAL_TABLET | Freq: Three times a day (TID) | ORAL | 0 refills | Status: DC | PRN

## 2018-08-15 MED FILL — IBUPROFEN 800 MG TABS: 800 | 10 days supply | Qty: 30 | Fill #0

## 2018-08-15 MED FILL — oxyCODONE HCL 5 MG TABS: 5 | 2 days supply | Qty: 10 | Fill #0

## 2018-08-20 ENCOUNTER — Telehealth: Payer: Self-pay | Admitting: *Deleted

## 2018-08-20 NOTE — Telephone Encounter (Signed)
Called and spoke with patient regarding her app for tomorrow. Patient has not had any signs/symptoms, traveled or had any exposure to COVID-19. Patient has had not had any contact with anyone sick or that has had any exposure. Explained that she will be asked the questions again tomorrow; along with a temperature. Also explained the no visitor policy.

## 2018-08-20 NOTE — Progress Notes (Signed)
Follow-up Note: Gyn-Onc  Consult was initially requested by Dr. Ihor Dow for the evaluation of Grace Gregory 58 y.o. female  CC:  Chief Complaint  Patient presents with  . VIN 3    postop follow-up    Assessment/Plan:  [name]  is a 58 y.o.  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  Discussed risk for recurrence. She will return to see Dr Ihor Dow for ongoing surveillance in 6-12 months.   Psoriatic rash - clobetasol once daily to posterior thighs  Recommended neosporin to vulvar incision/  HPI: [name] is a 58 year old woman with psoriatic arthritis and a longstanding history of vulvar dysplasia. She is seen in consultation at the request of Dr Ihor Dow for VIN 3.  The patient last had a procedure for VIN approximately 3 years ago.  She has developed recent vulvar pruritus and was seen by Dr. Ihor Dow for this who identified an area on the introitus that was hyperpigmented which he then biopsied on June 19, 2018.  The final pathology revealed high-grade squamous dysplasia.  The patient has a personal history of psoriatic arthritis.  She does not take steroids or immunosuppressive's for this. She has a remote history of cervical cryotherapy.  She reports that recent Pap smears have been normal.  She has had multiple procedures on the vulva including laser and excision.  Most of her prior treatments were in [place].  Interval hx:  On 08/06/18 she underwent a wide local excision of the left posterior vulva.  Final pathology confirmed VIN 3 with negative margins.  Postoperatively she had wound separation. She also had some psoriatic rash on posterior thighs.  Current Meds:  Outpatient Encounter Medications as of 08/21/2018  Medication Sig  . azelastine (ASTELIN) 0.1 % nasal spray Place into both nostrils 2 (two) times daily. Use in each nostril as directed  . budesonide-formoterol (SYMBICORT)  160-4.5 MCG/ACT inhaler Inhale 2 puffs into the lungs 2 (two) times daily.  . cetirizine (ZYRTEC) 10 MG tablet Take 10 mg by mouth daily.  Marland Kitchen estradiol (VIVELLE-DOT) 0.05 MG/24HR patch Place 1 patch (0.05 mg total) onto the skin 2 (two) times a week.  . gabapentin (NEURONTIN) 300 MG capsule TAKE 1 CAPSULE BY MOUTH TWICE DAILY  . hydrochlorothiazide (HYDRODIURIL) 12.5 MG tablet TAKE 1 TABLET BY MOUTH ONCE DAILY  . ibuprofen (ADVIL,MOTRIN) 800 MG tablet Take 1 tablet (800 mg total) by mouth every 8 (eight) hours as needed.  . metoprolol succinate (TOPROL-XL) 25 MG 24 hr tablet TAKE 1 TABLET BY MOUTH ONCE DAILY  . mometasone (NASONEX) 50 MCG/ACT nasal spray PLACE 1 SPRAY IN EACH NOSTRIL. MAY INCREASE TO 2 SPRAYS IN EACH NOSTRIL AS TOLERATED  . montelukast (SINGULAIR) 10 MG tablet Take 1 tablet (10 mg total) by mouth daily.  Marland Kitchen omeprazole (PRILOSEC) 40 MG capsule TAKE 1 CAPSULE (40 MG TOTAL) BY MOUTH DAILY.  Marland Kitchen oxyCODONE (OXY IR/ROXICODONE) 5 MG immediate release tablet Take 1 tablet (5 mg total) by mouth every 4 (four) hours as needed for severe pain. Do not take and drive  . PROAIR HFA 108 (90 Base) MCG/ACT inhaler Use as directed  . senna (SENOKOT) 8.6 MG TABS tablet Take 1 tablet (8.6 mg total) by mouth at bedtime.  . clobetasol cream (TEMOVATE) 4.08 % Apply 1 application topically 2 (two) times daily.  Marland Kitchen losartan (COZAAR) 50 MG tablet TAKE 1 TABLET BY MOUTH ONCE DAILY  . neomycin-bacitracin-polymyxin (NEOSPORIN) ointment Apply 1  application topically every 12 (twelve) hours. Apply to vulvar incision   No facility-administered encounter medications on file as of 08/21/2018.     Allergy:  Allergies  Allergen Reactions  . Lisinopril Cough  . Penicillins Rash    Social Hx:   Social History   Socioeconomic History  . Marital status: Married    Spouse name: Not on file  . Number of children: Not on file  . Years of education: Not on file  . Highest education level: Not on file  Occupational  History  . Not on file  Social Needs  . Financial resource strain: Not on file  . Food insecurity:    Worry: Not on file    Inability: Not on file  . Transportation needs:    Medical: Not on file    Non-medical: Not on file  Tobacco Use  . Smoking status: Never Smoker  . Smokeless tobacco: Never Used  Substance and Sexual Activity  . Alcohol use: Never    Frequency: Never  . Drug use: Never  . Sexual activity: Yes    Birth control/protection: None  Lifestyle  . Physical activity:    Days per week: Not on file    Minutes per session: Not on file  . Stress: Not on file  Relationships  . Social connections:    Talks on phone: Not on file    Gets together: Not on file    Attends religious service: Not on file    Active member of club or organization: Not on file    Attends meetings of clubs or organizations: Not on file    Relationship status: Not on file  . Intimate partner violence:    Fear of current or ex partner: Not on file    Emotionally abused: Not on file    Physically abused: Not on file    Forced sexual activity: Not on file  Other Topics Concern  . Not on file  Social History Narrative  . Not on file    Past Surgical Hx:  Past Surgical History:  Procedure Laterality Date  . ABDOMINAL HYSTERECTOMY    . BACK SURGERY    . VULVECTOMY N/A 08/06/2018   Procedure: WIDE LOCAL EXCISION VULVA;  Surgeon: Everitt Amber, MD;  Location: [facility];  Service: Gynecology;  Laterality: N/A;    Past Medical Hx:  Past Medical History:  Diagnosis Date  . Arthritis   . Arthritis   . GERD (gastroesophageal reflux disease)   . Hypertension   . Seasonal allergies   . Sleep apnea     Past Gynecological History:  G0, Hx of abnormal paps and cervical cryotherapy, hx of VIN3.  No LMP recorded (lmp unknown). Patient has had a hysterectomy.  Family Hx:  Family History  Problem Relation Age of Onset  . Hypertension Mother   . Heart disease Father   .  Hypertension Sister   . Hypertension Brother   . Hypertension Sister   . Hypertension Sister   . Cancer Brother   . Hypertension Brother     Review of Systems:  Constitutional  Feels well,   ENT Normal appearing ears and nares bilaterally Skin/Breast  No rash, sores, jaundice, itching, dryness Cardiovascular  No chest pain, shortness of breath, or edema  Pulmonary  No cough or wheeze.  Gastro Intestinal  No nausea, vomitting, or diarrhoea. No bright red blood per rectum, no abdominal pain, change in bowel movement, or constipation.  Genito Urinary  No frequency,  urgency, dysuria, no bleeding. Musculo Skeletal  No myalgia, arthralgia, joint swelling or pain  Neurologic  No weakness, numbness, change in gait,  Psychology  No depression, anxiety, insomnia.   Vitals:  Blood pressure 138/85, pulse 60, temperature 98.6 F (37 C), temperature source Oral, resp. rate 18, height 5\' 2"  (1.575 m), weight 240 lb 14.4 oz (109.3 kg), SpO2 100 %.  Physical Exam: WD in NAD Neck  Supple NROM, without any enlargements.  Lymph Node Survey No cervical supraclavicular or inguinal adenopathy Cardiovascular  Pulse normal rate, regularity and rhythm. S1 and S2 normal.  Lungs  Clear to auscultation bilateraly, without wheezes/crackles/rhonchi. Good air movement.  Skin  + rash on posterior thighs consistent with psoriasis rash Psychiatry  Alert and oriented to person, place, and time  Abdomen  Normoactive bowel sounds, abdomen soft, non-tender and obese without evidence of hernia.  Back No CVA tenderness Genito Urinary  Vulvar incision skin edges open but clean and granulating at skin base. Rectal  deferred Extremities  No bilateral cyanosis, clubbing or edema.   Thereasa Solo, MD  08/21/2018, 2:28 PM

## 2018-08-21 ENCOUNTER — Inpatient Hospital Stay: Payer: 59 | Attending: Gynecologic Oncology | Admitting: Gynecologic Oncology

## 2018-08-21 ENCOUNTER — Other Ambulatory Visit: Payer: Self-pay

## 2018-08-21 VITALS — BP 138/85 | HR 60 | Temp 98.6°F | Resp 18 | Ht 62.0 in | Wt 240.9 lb

## 2018-08-21 DIAGNOSIS — R21 Rash and other nonspecific skin eruption: Secondary | ICD-10-CM | POA: Diagnosis not present

## 2018-08-21 DIAGNOSIS — L4050 Arthropathic psoriasis, unspecified: Secondary | ICD-10-CM | POA: Insufficient documentation

## 2018-08-21 DIAGNOSIS — L409 Psoriasis, unspecified: Secondary | ICD-10-CM

## 2018-08-21 DIAGNOSIS — I10 Essential (primary) hypertension: Secondary | ICD-10-CM | POA: Diagnosis not present

## 2018-08-21 DIAGNOSIS — J301 Allergic rhinitis due to pollen: Secondary | ICD-10-CM | POA: Diagnosis not present

## 2018-08-21 DIAGNOSIS — K219 Gastro-esophageal reflux disease without esophagitis: Secondary | ICD-10-CM | POA: Diagnosis not present

## 2018-08-21 DIAGNOSIS — J3089 Other allergic rhinitis: Secondary | ICD-10-CM | POA: Diagnosis not present

## 2018-08-21 DIAGNOSIS — D071 Carcinoma in situ of vulva: Secondary | ICD-10-CM | POA: Diagnosis not present

## 2018-08-21 DIAGNOSIS — Z79899 Other long term (current) drug therapy: Secondary | ICD-10-CM | POA: Diagnosis not present

## 2018-08-21 MED ORDER — CLOBETASOL PROPIONATE 0.05 % EX CREA: 1.0000 "application " | TOPICAL_CREAM | Freq: Two times a day (BID) | CUTANEOUS | 0 refills | Status: DC

## 2018-08-21 MED ORDER — BACITRACIN-NEOMYCIN-POLYMYXIN 400-5-5000 EX OINT: 1.0000 "application " | TOPICAL_OINTMENT | Freq: Two times a day (BID) | CUTANEOUS | 0 refills | Status: DC

## 2018-08-21 MED FILL — SM TRIPLE ANTIBIOTIC ORIGIN: 3.5-400-500 | 28 days supply | Qty: 28 | Fill #0

## 2018-08-21 MED FILL — CLOBETASOL PROPIONATE 0.05: 0.05 | 15 days supply | Qty: 30 | Fill #0

## 2018-08-21 NOTE — Patient Instructions (Signed)
Plan to follow up with Dr. Ihor Dow in six months for a vulvar check.  Call for any questions, concerns, or needs.  Plan to apply clobetasol cream to the areas on your thighs and let us know if your symptoms persist.  You can also apply neosporin to the open incision.

## 2018-08-23 ENCOUNTER — Ambulatory Visit: Payer: 59 | Admitting: Gynecologic Oncology

## 2018-08-26 MED FILL — DOTTI 0.05 MG/24HR PTTW: 0.05 | 28 days supply | Qty: 8 | Fill #4

## 2018-08-29 DIAGNOSIS — J3089 Other allergic rhinitis: Secondary | ICD-10-CM | POA: Diagnosis not present

## 2018-08-29 DIAGNOSIS — J301 Allergic rhinitis due to pollen: Secondary | ICD-10-CM | POA: Diagnosis not present

## 2018-09-03 DIAGNOSIS — J301 Allergic rhinitis due to pollen: Secondary | ICD-10-CM | POA: Diagnosis not present

## 2018-09-03 DIAGNOSIS — J3089 Other allergic rhinitis: Secondary | ICD-10-CM | POA: Diagnosis not present

## 2018-09-13 DIAGNOSIS — J301 Allergic rhinitis due to pollen: Secondary | ICD-10-CM | POA: Diagnosis not present

## 2018-09-13 DIAGNOSIS — J3089 Other allergic rhinitis: Secondary | ICD-10-CM | POA: Diagnosis not present

## 2018-09-16 MED FILL — GABAPENTIN 300 MG CAPSULE: 300 | 30 days supply | Qty: 60 | Fill #2

## 2018-09-16 MED FILL — SYMBICORT 160-4.5 MCG INH: 160-4.5 | 30 days supply | Qty: 10 | Fill #5

## 2018-09-20 DIAGNOSIS — J301 Allergic rhinitis due to pollen: Secondary | ICD-10-CM | POA: Diagnosis not present

## 2018-09-20 DIAGNOSIS — J3089 Other allergic rhinitis: Secondary | ICD-10-CM | POA: Diagnosis not present

## 2018-09-23 DIAGNOSIS — J3089 Other allergic rhinitis: Secondary | ICD-10-CM | POA: Diagnosis not present

## 2018-09-23 DIAGNOSIS — J301 Allergic rhinitis due to pollen: Secondary | ICD-10-CM | POA: Diagnosis not present

## 2018-09-25 MED FILL — MONTELUKAST SOD 10 MG TAB: 10 | 90 days supply | Qty: 90 | Fill #0

## 2018-09-26 ENCOUNTER — Telehealth: Payer: Self-pay

## 2018-09-26 DIAGNOSIS — J3089 Other allergic rhinitis: Secondary | ICD-10-CM | POA: Diagnosis not present

## 2018-09-26 DIAGNOSIS — Z780 Asymptomatic menopausal state: Secondary | ICD-10-CM

## 2018-09-26 DIAGNOSIS — J301 Allergic rhinitis due to pollen: Secondary | ICD-10-CM | POA: Diagnosis not present

## 2018-09-26 MED ORDER — ESTRADIOL 0.05 MG/24HR TD PTTW: 1.0000 | MEDICATED_PATCH | TRANSDERMAL | 12 refills | Status: DC

## 2018-09-26 MED FILL — DOTTI 0.05 MG/24HR PTTW: 0.05 | 28 days supply | Qty: 8 | Fill #0

## 2018-09-26 NOTE — Telephone Encounter (Signed)
Refill requested for Grace Gregory. Kathrene Alu RN

## 2018-09-30 ENCOUNTER — Other Ambulatory Visit: Payer: Self-pay | Admitting: Family Medicine

## 2018-09-30 MED FILL — HYDROCHLOROTHIAZIDE 12.5 MG: 12.5 | 90 days supply | Qty: 90 | Fill #0

## 2018-10-04 DIAGNOSIS — J301 Allergic rhinitis due to pollen: Secondary | ICD-10-CM | POA: Diagnosis not present

## 2018-10-04 DIAGNOSIS — J3089 Other allergic rhinitis: Secondary | ICD-10-CM | POA: Diagnosis not present

## 2018-10-09 DIAGNOSIS — J3089 Other allergic rhinitis: Secondary | ICD-10-CM | POA: Diagnosis not present

## 2018-10-09 DIAGNOSIS — J301 Allergic rhinitis due to pollen: Secondary | ICD-10-CM | POA: Diagnosis not present

## 2018-10-11 DIAGNOSIS — J3089 Other allergic rhinitis: Secondary | ICD-10-CM | POA: Diagnosis not present

## 2018-10-11 DIAGNOSIS — J301 Allergic rhinitis due to pollen: Secondary | ICD-10-CM | POA: Diagnosis not present

## 2018-10-14 MED FILL — METOPROLOL SUCCINATE ER 25: 25 | 90 days supply | Qty: 90 | Fill #1

## 2018-10-15 ENCOUNTER — Other Ambulatory Visit: Payer: Self-pay | Admitting: Family Medicine

## 2018-10-15 MED FILL — LOSARTAN POTASSIUM 50 MG TA: 50 | 90 days supply | Qty: 90 | Fill #0

## 2018-10-17 DIAGNOSIS — J3089 Other allergic rhinitis: Secondary | ICD-10-CM | POA: Diagnosis not present

## 2018-10-17 DIAGNOSIS — J301 Allergic rhinitis due to pollen: Secondary | ICD-10-CM | POA: Diagnosis not present

## 2018-10-21 MED FILL — DOTTI 0.05 MG/24HR PTTW: 0.05 | 28 days supply | Qty: 8 | Fill #1

## 2018-10-21 MED FILL — GABAPENTIN 300 MG CAPSULE: 300 | 30 days supply | Qty: 60 | Fill #3

## 2018-10-21 MED FILL — OMEPRAZOLE 40 MG CPDR: 40 | 90 days supply | Qty: 90 | Fill #1

## 2018-10-22 DIAGNOSIS — J3089 Other allergic rhinitis: Secondary | ICD-10-CM | POA: Diagnosis not present

## 2018-10-22 DIAGNOSIS — J301 Allergic rhinitis due to pollen: Secondary | ICD-10-CM | POA: Diagnosis not present

## 2018-10-28 ENCOUNTER — Encounter: Payer: Self-pay | Admitting: Gynecologic Oncology

## 2018-10-28 DIAGNOSIS — J3089 Other allergic rhinitis: Secondary | ICD-10-CM | POA: Diagnosis not present

## 2018-10-28 DIAGNOSIS — J301 Allergic rhinitis due to pollen: Secondary | ICD-10-CM | POA: Diagnosis not present

## 2018-11-07 DIAGNOSIS — J301 Allergic rhinitis due to pollen: Secondary | ICD-10-CM | POA: Diagnosis not present

## 2018-11-07 DIAGNOSIS — J3089 Other allergic rhinitis: Secondary | ICD-10-CM | POA: Diagnosis not present

## 2018-11-12 DIAGNOSIS — J3089 Other allergic rhinitis: Secondary | ICD-10-CM | POA: Diagnosis not present

## 2018-11-12 DIAGNOSIS — J301 Allergic rhinitis due to pollen: Secondary | ICD-10-CM | POA: Diagnosis not present

## 2018-11-18 ENCOUNTER — Other Ambulatory Visit: Payer: Self-pay | Admitting: Family Medicine

## 2018-11-18 MED FILL — DOTTI 0.05 MG/24HR PTTW: 0.05 | 28 days supply | Qty: 8 | Fill #2

## 2018-11-18 MED FILL — GABAPENTIN 300 MG CAPSULE: 300 | 30 days supply | Qty: 60 | Fill #0

## 2018-11-20 DIAGNOSIS — J301 Allergic rhinitis due to pollen: Secondary | ICD-10-CM | POA: Diagnosis not present

## 2018-11-20 DIAGNOSIS — J3089 Other allergic rhinitis: Secondary | ICD-10-CM | POA: Diagnosis not present

## 2018-11-21 MED FILL — SYMBICORT 160-4.5 MCG INH: 160-4.5 | 30 days supply | Qty: 10 | Fill #0

## 2018-11-27 DIAGNOSIS — J3089 Other allergic rhinitis: Secondary | ICD-10-CM | POA: Diagnosis not present

## 2018-11-27 DIAGNOSIS — J301 Allergic rhinitis due to pollen: Secondary | ICD-10-CM | POA: Diagnosis not present

## 2018-11-29 DIAGNOSIS — J301 Allergic rhinitis due to pollen: Secondary | ICD-10-CM | POA: Diagnosis not present

## 2018-12-02 DIAGNOSIS — J3089 Other allergic rhinitis: Secondary | ICD-10-CM | POA: Diagnosis not present

## 2018-12-03 DIAGNOSIS — J3089 Other allergic rhinitis: Secondary | ICD-10-CM | POA: Diagnosis not present

## 2018-12-03 DIAGNOSIS — J301 Allergic rhinitis due to pollen: Secondary | ICD-10-CM | POA: Diagnosis not present

## 2018-12-10 DIAGNOSIS — J3089 Other allergic rhinitis: Secondary | ICD-10-CM | POA: Diagnosis not present

## 2018-12-10 DIAGNOSIS — J301 Allergic rhinitis due to pollen: Secondary | ICD-10-CM | POA: Diagnosis not present

## 2018-12-16 MED FILL — DOTTI 0.05 MG/24HR PTTW: 0.05 | 28 days supply | Qty: 8 | Fill #3

## 2018-12-19 DIAGNOSIS — J301 Allergic rhinitis due to pollen: Secondary | ICD-10-CM | POA: Diagnosis not present

## 2018-12-19 DIAGNOSIS — J3089 Other allergic rhinitis: Secondary | ICD-10-CM | POA: Diagnosis not present

## 2018-12-23 DIAGNOSIS — J301 Allergic rhinitis due to pollen: Secondary | ICD-10-CM | POA: Diagnosis not present

## 2018-12-23 DIAGNOSIS — J3089 Other allergic rhinitis: Secondary | ICD-10-CM | POA: Diagnosis not present

## 2018-12-23 MED FILL — GABAPENTIN 300 MG CAPSULE: 300 | 30 days supply | Qty: 60 | Fill #1

## 2018-12-25 DIAGNOSIS — J3089 Other allergic rhinitis: Secondary | ICD-10-CM | POA: Diagnosis not present

## 2018-12-25 DIAGNOSIS — J301 Allergic rhinitis due to pollen: Secondary | ICD-10-CM | POA: Diagnosis not present

## 2018-12-29 ENCOUNTER — Encounter: Payer: Self-pay | Admitting: Family Medicine

## 2018-12-30 ENCOUNTER — Other Ambulatory Visit: Payer: Self-pay

## 2018-12-30 ENCOUNTER — Ambulatory Visit: Payer: 59 | Admitting: Family Medicine

## 2018-12-30 ENCOUNTER — Encounter: Payer: Self-pay | Admitting: Family Medicine

## 2018-12-30 ENCOUNTER — Telehealth: Payer: Self-pay

## 2018-12-30 VITALS — BP 128/78 | HR 98 | Ht 62.0 in | Wt 242.4 lb

## 2018-12-30 DIAGNOSIS — Z0000 Encounter for general adult medical examination without abnormal findings: Secondary | ICD-10-CM

## 2018-12-30 DIAGNOSIS — R7309 Other abnormal glucose: Secondary | ICD-10-CM

## 2018-12-30 DIAGNOSIS — E1165 Type 2 diabetes mellitus with hyperglycemia: Secondary | ICD-10-CM

## 2018-12-30 DIAGNOSIS — R81 Glycosuria: Secondary | ICD-10-CM

## 2018-12-30 DIAGNOSIS — K140 Glossitis: Secondary | ICD-10-CM | POA: Diagnosis not present

## 2018-12-30 DIAGNOSIS — R350 Frequency of micturition: Secondary | ICD-10-CM

## 2018-12-30 LAB — POCT URINALYSIS DIPSTICK
Bilirubin, UA: NEGATIVE
Blood, UA: NEGATIVE
Glucose, UA: POSITIVE — AB
Ketones, UA: POSITIVE
Leukocytes, UA: NEGATIVE
Nitrite, UA: NEGATIVE
Protein, UA: POSITIVE — AB
Spec Grav, UA: 1.025 (ref 1.010–1.025)
Urobilinogen, UA: 0.2 E.U./dL
pH, UA: 5.5 (ref 5.0–8.0)

## 2018-12-30 MED ORDER — FLUCONAZOLE 150 MG PO TABS: ORAL_TABLET | ORAL | 0 refills | Status: DC

## 2018-12-30 MED FILL — AZELASTINE HCL 137 MCG SPRY: 0.1 | 25 days supply | Qty: 30 | Fill #0

## 2018-12-30 MED FILL — MOMETASONE FUROATE 50 MCG S: 50 | 30 days supply | Qty: 17 | Fill #1

## 2018-12-30 MED FILL — HYDROCHLOROTHIAZIDE 12.5 MG: 12.5 | 90 days supply | Qty: 90 | Fill #1

## 2018-12-30 MED FILL — MONTELUKAST SOD 10 MG TAB: 10 | 90 days supply | Qty: 90 | Fill #1

## 2018-12-30 MED FILL — FLUCONAZOLE 150 MG TABS: 150 | 7 days supply | Qty: 7 | Fill #0

## 2018-12-30 MED FILL — SYMBICORT 160-4.5 MCG INH: 160-4.5 | 30 days supply | Qty: 10 | Fill #1

## 2018-12-30 NOTE — Telephone Encounter (Signed)

## 2018-12-30 NOTE — Progress Notes (Signed)
Established Patient Office Visit  Subjective:  Patient ID: Grace Gregory, female    DOB: [birth year]  Age: 58 y.o. MRN: [id number]  CC:  Chief Complaint  Patient presents with  . Urinary Frequency    HPI Evvie Behrmann presents for a 1 day history of urinary frequency with some suprapubic pressure.  Denies vaginal discharge dysuria fevers chills nausea or vomiting flank pain.  G1P010.  No direct family history of diabetes.  Chart review does show elevated blood sugars.  She is married and in a stable relationship.  She is taking a hiatus from work to help her sister take care of her niece and nephew.  Sister is recovering from a cerebral aneurysm.  Patient's left tongue feels irritated.  Past Medical History:  Diagnosis Date  . Arthritis   . Arthritis   . GERD (gastroesophageal reflux disease)   . Hypertension   . Seasonal allergies   . Sleep apnea     Past Surgical History:  Procedure Laterality Date  . ABDOMINAL HYSTERECTOMY    . BACK SURGERY    . VULVECTOMY N/A 08/06/2018   Procedure: WIDE LOCAL EXCISION VULVA;  Surgeon: Everitt Amber, MD;  Location[facility];  Service: Gynecology;  Laterality: N/A;    Family History  Problem Relation Age of Onset  . Hypertension Mother   . Heart disease Father   . Hypertension Sister   . Hypertension Brother   . Hypertension Sister   . Hypertension Sister   . Cancer Brother   . Hypertension Brother     Social History   Socioeconomic History  . Marital status: Married    Spouse name: Not on file  . Number of children: Not on file  . Years of education: Not on file  . Highest education level: Not on file  Occupational History  . Not on file  Social Needs  . Financial resource strain: Not on file  . Food insecurity    Worry: Not on file    Inability: Not on file  . Transportation needs    Medical: Not on file    Non-medical: Not on file  Tobacco Use  . Smoking status: Never Smoker  .  Smokeless tobacco: Never Used  Substance and Sexual Activity  . Alcohol use: Never    Frequency: Never  . Drug use: Never  . Sexual activity: Yes    Birth control/protection: None  Lifestyle  . Physical activity    Days per week: Not on file    Minutes per session: Not on file  . Stress: Not on file  Relationships  . Social Herbalist on phone: Not on file    Gets together: Not on file    Attends religious service: Not on file    Active member of club or organization: Not on file    Attends meetings of clubs or organizations: Not on file    Relationship status: Not on file  . Intimate partner violence    Fear of current or ex partner: Not on file    Emotionally abused: Not on file    Physically abused: Not on file    Forced sexual activity: Not on file  Other Topics Concern  . Not on file  Social History Narrative  . Not on file    Outpatient Medications Prior to Visit  Medication Sig Dispense Refill  . azelastine (ASTELIN) 0.1 % nasal spray Place into both nostrils 2 (two) times daily. Use  in each nostril as directed    . budesonide-formoterol (SYMBICORT) 160-4.5 MCG/ACT inhaler Inhale 2 puffs into the lungs 2 (two) times daily.    . cetirizine (ZYRTEC) 10 MG tablet Take 10 mg by mouth daily.    . clobetasol cream (TEMOVATE) 6.76 % Apply 1 application topically 2 (two) times daily. 30 g 0  . estradiol (DOTTI) 0.05 MG/24HR patch Place 1 patch (0.05 mg total) onto the skin 2 (two) times a week. 8 patch 12  . gabapentin (NEURONTIN) 300 MG capsule TAKE 1 CAPSULE BY MOUTH TWICE DAILY 60 capsule 3  . hydrochlorothiazide (HYDRODIURIL) 12.5 MG tablet TAKE 1 TABLET BY MOUTH ONCE DAILY 90 tablet 1  . ibuprofen (ADVIL,MOTRIN) 800 MG tablet Take 1 tablet (800 mg total) by mouth every 8 (eight) hours as needed. 30 tablet 0  . losartan (COZAAR) 50 MG tablet TAKE 1 TABLET BY MOUTH ONCE DAILY 90 tablet 1  . metoprolol succinate (TOPROL-XL) 25 MG 24 hr tablet TAKE 1 TABLET BY  MOUTH ONCE DAILY 90 tablet 1  . mometasone (NASONEX) 50 MCG/ACT nasal spray PLACE 1 SPRAY IN EACH NOSTRIL. MAY INCREASE TO 2 SPRAYS IN EACH NOSTRIL AS TOLERATED 17 g 2  . montelukast (SINGULAIR) 10 MG tablet Take 1 tablet (10 mg total) by mouth daily. 90 tablet 2  . omeprazole (PRILOSEC) 40 MG capsule TAKE 1 CAPSULE (40 MG TOTAL) BY MOUTH DAILY. 90 capsule 2  . PROAIR HFA 108 (90 Base) MCG/ACT inhaler Use as directed    . senna (SENOKOT) 8.6 MG TABS tablet Take 1 tablet (8.6 mg total) by mouth at bedtime. 120 each 0  . estradiol (VIVELLE-DOT) 0.05 MG/24HR patch Place 1 patch (0.05 mg total) onto the skin 2 (two) times a week. 8 patch 4  . neomycin-bacitracin-polymyxin (NEOSPORIN) ointment Apply 1 application topically every 12 (twelve) hours. Apply to vulvar incision 15 g 0  . oxyCODONE (OXY IR/ROXICODONE) 5 MG immediate release tablet Take 1 tablet (5 mg total) by mouth every 4 (four) hours as needed for severe pain. Do not take and drive 10 tablet 0   No facility-administered medications prior to visit.     Allergies  Allergen Reactions  . Lisinopril Cough  . Penicillins Rash    ROS Review of Systems  Constitutional: Negative.   HENT: Negative.   Eyes: Negative for photophobia and visual disturbance.  Respiratory: Negative.   Cardiovascular: Negative.   Gastrointestinal: Negative.   Endocrine: Negative for polyphagia and polyuria.  Genitourinary: Positive for frequency. Negative for difficulty urinating, dysuria and vaginal discharge.  Musculoskeletal: Negative for gait problem and joint swelling.  Skin: Negative for pallor and rash.  Allergic/Immunologic: Negative for immunocompromised state.  Neurological: Negative for seizures and numbness.  Hematological: Does not bruise/bleed easily.  Psychiatric/Behavioral: Negative.       Objective:    Physical Exam  Constitutional: She is oriented to person, place, and time. She appears well-developed and well-nourished. No  distress.  HENT:  Head: Normocephalic and atraumatic.  Right Ear: External ear normal.  Left Ear: External ear normal.  Mouth/Throat: Uvula is midline, oropharynx is clear and moist and mucous membranes are normal. No oral lesions. Normal dentition.  Eyes: Pupils are equal, round, and reactive to light. Conjunctivae are normal. Right eye exhibits no discharge. Left eye exhibits no discharge. No scleral icterus.  Neck: No JVD present. No tracheal deviation present.  Cardiovascular: Normal rate, regular rhythm and normal heart sounds.  Pulmonary/Chest: Effort normal and breath sounds normal. No stridor.  Musculoskeletal: Normal range of motion.  Neurological: She is alert and oriented to person, place, and time.  Skin: Skin is warm and dry. She is not diaphoretic.  Psychiatric: She has a normal mood and affect. Her behavior is normal.    BP 128/78   Pulse 98   Ht 5\' 2"  (1.575 m)   Wt 242 lb 6 oz (109.9 kg)   LMP  (LMP Unknown)   SpO2 96%   BMI 44.33 kg/m  Wt Readings from Last 3 Encounters:  12/30/18 242 lb 6 oz (109.9 kg)  08/21/18 240 lb 14.4 oz (109.3 kg)  08/06/18 239 lb 6.4 oz (108.6 kg)   BP Readings from Last 3 Encounters:  12/30/18 128/78  08/21/18 138/85  08/06/18 (!) 142/72   Guideline developer:  UpToDate (see UpToDate for funding source) Date Released: June 2014  Health Maintenance Due  Topic Date Due  . Hepatitis C Screening  04[birth year]/1960  . HIV Screening  12/16/1974  . INFLUENZA VACCINE  12/21/2018    There are no preventive care reminders to display for this patient.  No results found for: TSH No results found for: WBC, HGB, HCT, MCV, PLT Lab Results  Component Value Date   NA 137 08/06/2018   K 4.7 08/06/2018   CO2 27 08/06/2018   GLUCOSE 166 (H) 08/06/2018   BUN 14 08/06/2018   CREATININE 0.85 08/06/2018   ALKPHOS 81 01/20/2016   AST 25 01/20/2016   ALT 18 01/20/2016   CALCIUM 9.2 08/06/2018   ANIONGAP 8 08/06/2018   Lab Results  Component  Value Date   CHOL 172 01/20/2016   Lab Results  Component Value Date   HDL 71 (A) 01/20/2016   Lab Results  Component Value Date   LDLCALC 80 01/20/2016   Lab Results  Component Value Date   TRIG 109 01/20/2016   No results found for: CHOLHDL No results found for: HGBA1C    Assessment & Plan:   Problem List Items Addressed This Visit      Digestive   Glossitis   Relevant Medications   fluconazole (DIFLUCAN) 150 MG tablet   Other Relevant Orders   CBC    Other Visit Diagnoses    Urinary frequency    -  Primary   Relevant Orders   POCT urinalysis dipstick (Completed)   Comprehensive metabolic panel   Hemoglobin A1c   Urinalysis, Routine w reflex microscopic   Healthcare maintenance       Relevant Orders   CBC   Comprehensive metabolic panel   Lipid panel   Urinalysis, Routine w reflex microscopic   TSH   HIV Antibody (routine testing w rflx)   Elevated glucose       Relevant Orders   Hemoglobin A1c   Urinalysis, Routine w reflex microscopic   Microalbumin / creatinine urine ratio   Glucosuria       Relevant Orders   Hemoglobin A1c      Meds ordered this encounter  Medications  . fluconazole (DIFLUCAN) 150 MG tablet    Sig: Take one daily for 7 days.    Dispense:  7 tablet    Refill:  0    Follow-up: Return in about 2 weeks (around 01/13/2019), or return fasting for blood work..    Return fasting for blood work and then in 2 weeks for follow-up.

## 2018-12-30 NOTE — Patient Instructions (Signed)
Diabetes Mellitus and Standards of Medical Care Managing diabetes (diabetes mellitus) can be complicated. Your diabetes treatment may be managed by a team of health care providers, including:  A physician who specializes in diabetes (endocrinologist).  A nurse practitioner or physician assistant.  Nurses.  A diet and nutrition specialist (registered dietitian).  A certified diabetes educator (CDE).  An exercise specialist.  A pharmacist.  An eye doctor.  A foot specialist (podiatrist).  A dentist.  A primary care provider.  A mental health provider. Your health care providers follow guidelines to help you get the best quality of care. The following schedule is a general guideline for your diabetes management plan. Your health care providers may give you more specific instructions. Physical exams Upon being diagnosed with diabetes mellitus, and each year after that, your health care provider will ask about your medical and family history. He or she will also do a physical exam. Your exam may include:  Measuring your height, weight, and body mass index (BMI).  Checking your blood pressure. This will be done at every routine medical visit. Your target blood pressure may vary depending on your medical conditions, your age, and other factors.  Thyroid gland exam.  Skin exam.  Screening for damage to your nerves (peripheral neuropathy). This may include checking the pulse in your legs and feet and checking the level of sensation in your hands and feet.  A complete foot exam to inspect the structure and skin of your feet, including checking for cuts, bruises, redness, blisters, sores, or other problems.  Screening for blood vessel (vascular) problems, which may include checking the pulse in your legs and feet and checking your temperature. Blood tests Depending on your treatment plan and your personal needs, you may have the following tests done:  HbA1c (hemoglobin A1c). This  test provides information about blood sugar (glucose) control over the previous 2-3 months. It is used to adjust your treatment plan, if needed. This test will be done: ? At least 2 times a year, if you are meeting your treatment goals. ? 4 times a year, if you are not meeting your treatment goals or if treatment goals have changed.  Lipid testing, including total, LDL, and HDL cholesterol and triglyceride levels. ? The goal for LDL is less than 100 mg/dL (5.5 mmol/L). If you are at high risk for complications, the goal is less than 70 mg/dL (3.9 mmol/L). ? The goal for HDL is 40 mg/dL (2.2 mmol/L) or higher for men and 50 mg/dL (2.8 mmol/L) or higher for women. An HDL cholesterol of 60 mg/dL (3.3 mmol/L) or higher gives some protection against heart disease. ? The goal for triglycerides is less than 150 mg/dL (8.3 mmol/L).  Liver function tests.  Kidney function tests.  Thyroid function tests. Dental and eye exams  Visit your dentist two times a year.  If you have type 1 diabetes, your health care provider may recommend an eye exam 3-5 years after you are diagnosed, and then once a year after your first exam. ? For children with type 1 diabetes, a health care provider may recommend an eye exam when your child is age 10 or older and has had diabetes for 3-5 years. After the first exam, your child should get an eye exam once a year.  If you have type 2 diabetes, your health care provider may recommend an eye exam as soon as you are diagnosed, and then once a year after your first exam. Immunizations   The  yearly flu (influenza) vaccine is recommended for everyone 6 months or older who has diabetes.  The pneumonia (pneumococcal) vaccine is recommended for everyone 2 years or older who has diabetes. If you are 28 or older, you may get the pneumonia vaccine as a series of two separate shots.  The hepatitis B vaccine is recommended for adults shortly after being diagnosed with diabetes.   Adults and children with diabetes should receive all other vaccines according to age-specific recommendations from the Centers for Disease Control and Prevention (CDC). Mental and emotional health Screening for symptoms of eating disorders, anxiety, and depression is recommended at the time of diagnosis and afterward as needed. If your screening shows that you have symptoms (positive screening result), you may need more evaluation and you may work with a mental health care provider. Treatment plan Your treatment plan will be reviewed at every medical visit. You and your health care provider will discuss:  How you are taking your medicines, including insulin.  Any side effects you are experiencing.  Your blood glucose target goals.  The frequency of your blood glucose monitoring.  Lifestyle habits, such as activity level as well as tobacco, alcohol, and substance use. Diabetes self-management education Your health care provider will assess how well you are monitoring your blood glucose levels and whether you are taking your insulin correctly. He or she may refer you to:  A certified diabetes educator to manage your diabetes throughout your life, starting at diagnosis.  A registered dietitian who can create or review your personal nutrition plan.  An exercise specialist who can discuss your activity level and exercise plan. Summary  Managing diabetes (diabetes mellitus) can be complicated. Your diabetes treatment may be managed by a team of health care providers.  Your health care providers follow guidelines in order to help you get the best quality of care.  Standards of care including having regular physical exams, blood tests, blood pressure monitoring, immunizations, screening tests, and education about how to manage your diabetes.  Your health care providers may also give you more specific instructions based on your individual health. This information is not intended to replace  advice given to you by your health care provider. Make sure you discuss any questions you have with your health care provider. Document Released: 03/05/2009 Document Revised: 01/25/2018 Document Reviewed: 02/04/2016 Elsevier Patient Education  2020 [employer].

## 2018-12-31 ENCOUNTER — Other Ambulatory Visit (INDEPENDENT_AMBULATORY_CARE_PROVIDER_SITE_OTHER): Payer: 59

## 2018-12-31 DIAGNOSIS — Z0000 Encounter for general adult medical examination without abnormal findings: Secondary | ICD-10-CM

## 2018-12-31 DIAGNOSIS — R81 Glycosuria: Secondary | ICD-10-CM

## 2018-12-31 DIAGNOSIS — R350 Frequency of micturition: Secondary | ICD-10-CM

## 2018-12-31 DIAGNOSIS — J301 Allergic rhinitis due to pollen: Secondary | ICD-10-CM | POA: Diagnosis not present

## 2018-12-31 DIAGNOSIS — K140 Glossitis: Secondary | ICD-10-CM | POA: Diagnosis not present

## 2018-12-31 DIAGNOSIS — R7309 Other abnormal glucose: Secondary | ICD-10-CM

## 2018-12-31 DIAGNOSIS — J3089 Other allergic rhinitis: Secondary | ICD-10-CM | POA: Diagnosis not present

## 2018-12-31 DIAGNOSIS — E1165 Type 2 diabetes mellitus with hyperglycemia: Secondary | ICD-10-CM | POA: Insufficient documentation

## 2018-12-31 DIAGNOSIS — J4540 Moderate persistent asthma, uncomplicated: Secondary | ICD-10-CM | POA: Diagnosis not present

## 2018-12-31 LAB — COMPREHENSIVE METABOLIC PANEL
ALT: 24 U/L (ref 0–35)
AST: 18 U/L (ref 0–37)
Albumin: 4 g/dL (ref 3.5–5.2)
Alkaline Phosphatase: 82 U/L (ref 39–117)
BUN: 14 mg/dL (ref 6–23)
CO2: 27 mEq/L (ref 19–32)
Calcium: 9.5 mg/dL (ref 8.4–10.5)
Chloride: 100 mEq/L (ref 96–112)
Creatinine, Ser: 0.78 mg/dL (ref 0.40–1.20)
GFR: 91.45 mL/min (ref >60.00)
Glucose, Bld: 246 mg/dL — ABNORMAL HIGH (ref 70–99)
Potassium: 3.8 mEq/L (ref 3.5–5.1)
Sodium: 136 mEq/L (ref 135–145)
Total Bilirubin: 0.3 mg/dL (ref 0.2–1.2)
Total Protein: 7.4 g/dL (ref 6.0–8.3)

## 2018-12-31 LAB — URINALYSIS, ROUTINE W REFLEX MICROSCOPIC
Bilirubin Urine: NEGATIVE
Hgb urine dipstick: NEGATIVE
Ketones, ur: NEGATIVE
Nitrite: NEGATIVE
Specific Gravity, Urine: 1.025 (ref 1.000–1.030)
Total Protein, Urine: NEGATIVE
Urine Glucose: NEGATIVE
Urobilinogen, UA: 0.2 (ref 0.0–1.0)
pH: 5.5 (ref 5.0–8.0)

## 2018-12-31 LAB — LIPID PANEL
Cholesterol: 199 mg/dL (ref 0–200)
HDL: 50.3 mg/dL (ref >39.00)
LDL Cholesterol: 127 mg/dL — ABNORMAL HIGH (ref 0–99)
NonHDL: 148.92
Total CHOL/HDL Ratio: 4
Triglycerides: 111 mg/dL (ref 0.0–149.0)
VLDL: 22.2 mg/dL (ref 0.0–40.0)

## 2018-12-31 LAB — CBC
HCT: 40.1 % (ref 36.0–46.0)
Hemoglobin: 13.3 g/dL (ref 12.0–15.0)
MCHC: 33.1 g/dL (ref 30.0–36.0)
MCV: 87.3 fl (ref 78.0–100.0)
Platelets: 274 10*3/uL (ref 150.0–400.0)
RBC: 4.6 Mil/uL (ref 3.87–5.11)
RDW: 14.3 % (ref 11.5–15.5)
WBC: 5 10*3/uL (ref 4.0–10.5)

## 2018-12-31 LAB — MICROALBUMIN / CREATININE URINE RATIO
Creatinine,U: 169.1 mg/dL
Microalb Creat Ratio: 0.7 mg/g (ref 0.0–30.0)
Microalb, Ur: 1.3 mg/dL (ref 0.0–1.9)

## 2018-12-31 LAB — TSH: TSH: 3.09 u[IU]/mL (ref 0.35–4.50)

## 2018-12-31 LAB — HEMOGLOBIN A1C: Hgb A1c MFr Bld: 10.3 % — ABNORMAL HIGH (ref 4.6–6.5)

## 2018-12-31 MED ORDER — METFORMIN HCL 500 MG PO TABS: 500.0000 mg | ORAL_TABLET | Freq: Two times a day (BID) | ORAL | 0 refills | Status: DC

## 2018-12-31 MED FILL — metFORMIN HCL 500 MG TABS: 500 | 90 days supply | Qty: 180 | Fill #0

## 2018-12-31 NOTE — Addendum Note (Signed)
Addended by: Jon Billings on: 12/31/2018 01:12 PM   Modules accepted: Orders

## 2019-01-01 ENCOUNTER — Other Ambulatory Visit: Payer: Self-pay | Admitting: Family Medicine

## 2019-01-01 DIAGNOSIS — Z1231 Encounter for screening mammogram for malignant neoplasm of breast: Secondary | ICD-10-CM

## 2019-01-01 LAB — HIV ANTIBODY (ROUTINE TESTING W REFLEX): HIV 1&2 Ab, 4th Generation: NONREACTIVE

## 2019-01-03 DIAGNOSIS — H52223 Regular astigmatism, bilateral: Secondary | ICD-10-CM | POA: Diagnosis not present

## 2019-01-03 DIAGNOSIS — J301 Allergic rhinitis due to pollen: Secondary | ICD-10-CM | POA: Diagnosis not present

## 2019-01-03 DIAGNOSIS — H5203 Hypermetropia, bilateral: Secondary | ICD-10-CM | POA: Diagnosis not present

## 2019-01-03 DIAGNOSIS — J3089 Other allergic rhinitis: Secondary | ICD-10-CM | POA: Diagnosis not present

## 2019-01-03 DIAGNOSIS — H524 Presbyopia: Secondary | ICD-10-CM | POA: Diagnosis not present

## 2019-01-03 DIAGNOSIS — J3081 Allergic rhinitis due to animal (cat) (dog) hair and dander: Secondary | ICD-10-CM | POA: Diagnosis not present

## 2019-01-03 LAB — HM DIABETES EYE EXAM

## 2019-01-10 DIAGNOSIS — J301 Allergic rhinitis due to pollen: Secondary | ICD-10-CM | POA: Diagnosis not present

## 2019-01-10 DIAGNOSIS — J3089 Other allergic rhinitis: Secondary | ICD-10-CM | POA: Diagnosis not present

## 2019-01-13 ENCOUNTER — Encounter: Payer: Self-pay | Admitting: Family Medicine

## 2019-01-14 ENCOUNTER — Ambulatory Visit: Payer: 59 | Admitting: Family Medicine

## 2019-01-14 DIAGNOSIS — J301 Allergic rhinitis due to pollen: Secondary | ICD-10-CM | POA: Diagnosis not present

## 2019-01-14 DIAGNOSIS — J3089 Other allergic rhinitis: Secondary | ICD-10-CM | POA: Diagnosis not present

## 2019-01-14 MED FILL — DOTTI 0.05 MG/24HR PTTW: 0.05 | 28 days supply | Qty: 8 | Fill #4

## 2019-01-20 ENCOUNTER — Other Ambulatory Visit: Payer: Self-pay | Admitting: Family Medicine

## 2019-01-20 MED FILL — GABAPENTIN 300 MG CAPSULE: 300 | 30 days supply | Qty: 60 | Fill #2

## 2019-01-20 MED FILL — LOSARTAN POTASSIUM 50 MG TA: 50 | 90 days supply | Qty: 90 | Fill #1

## 2019-01-20 MED FILL — METOPROLOL SUCCINATE ER 25: 25 | 90 days supply | Qty: 90 | Fill #0

## 2019-01-20 MED FILL — OMEPRAZOLE 40 MG CPDR: 40 | 90 days supply | Qty: 90 | Fill #2

## 2019-01-24 ENCOUNTER — Telehealth: Payer: Self-pay

## 2019-01-24 DIAGNOSIS — J3089 Other allergic rhinitis: Secondary | ICD-10-CM | POA: Diagnosis not present

## 2019-01-24 DIAGNOSIS — J301 Allergic rhinitis due to pollen: Secondary | ICD-10-CM | POA: Diagnosis not present

## 2019-01-24 NOTE — Telephone Encounter (Signed)

## 2019-01-28 ENCOUNTER — Encounter: Payer: Self-pay | Admitting: Family Medicine

## 2019-01-28 ENCOUNTER — Encounter: Payer: 59 | Attending: Family Medicine | Admitting: Dietician

## 2019-01-28 ENCOUNTER — Other Ambulatory Visit: Payer: Self-pay

## 2019-01-28 ENCOUNTER — Telehealth: Payer: Self-pay | Admitting: Family Medicine

## 2019-01-28 ENCOUNTER — Ambulatory Visit: Payer: 59 | Admitting: Family Medicine

## 2019-01-28 ENCOUNTER — Encounter: Payer: Self-pay | Admitting: Dietician

## 2019-01-28 VITALS — BP 130/80 | HR 96 | Ht 62.0 in | Wt 240.0 lb

## 2019-01-28 DIAGNOSIS — J301 Allergic rhinitis due to pollen: Secondary | ICD-10-CM | POA: Diagnosis not present

## 2019-01-28 DIAGNOSIS — E1165 Type 2 diabetes mellitus with hyperglycemia: Secondary | ICD-10-CM

## 2019-01-28 DIAGNOSIS — L720 Epidermal cyst: Secondary | ICD-10-CM

## 2019-01-28 DIAGNOSIS — I10 Essential (primary) hypertension: Secondary | ICD-10-CM

## 2019-01-28 DIAGNOSIS — Z23 Encounter for immunization: Secondary | ICD-10-CM | POA: Insufficient documentation

## 2019-01-28 DIAGNOSIS — J3089 Other allergic rhinitis: Secondary | ICD-10-CM | POA: Diagnosis not present

## 2019-01-28 MED ORDER — CONTOUR NEXT TEST VI STRP: ORAL_STRIP | 12 refills | Status: DC

## 2019-01-28 NOTE — Patient Instructions (Signed)
Epidermal Cyst  An epidermal cyst is a sac made of skin tissue. The sac contains a substance called keratin. Keratin is a protein that is normally secreted through the hair follicles. When keratin becomes trapped in the top layer of skin (epidermis), it can form an epidermal cyst. Epidermal cysts can be found anywhere on your body. These cysts are usually harmless (benign), and they may not cause symptoms unless they become infected. What are the causes? This condition may be caused by:  A blocked hair follicle.  A hair that curls and re-enters the skin instead of growing straight out of the skin (ingrown hair).  A blocked pore.  Irritated skin.  An injury to the skin.  Certain conditions that are passed along from parent to child (inherited).  Human papillomavirus (HPV).  Long-term (chronic) sun damage to the skin. What increases the risk? The following factors may make you more likely to develop an epidermal cyst:  Having acne.  Being overweight.  Being 30-40 years old. What are the signs or symptoms? The only symptom of this condition may be a small, painless lump underneath the skin. When an epidermal cyst ruptures, it may become infected. Symptoms may include:  Redness.  Inflammation.  Tenderness.  Warmth.  Fever.  Keratin draining from the cyst. Keratin is grayish-white, bad-smelling substance.  Pus draining from the cyst. How is this diagnosed? This condition is diagnosed with a physical exam.  In some cases, you may have a sample of tissue (biopsy) taken from your cyst to be examined under a microscope or tested for bacteria.  You may be referred to a health care provider who specializes in skin care (dermatologist). How is this treated? In many cases, epidermal cysts go away on their own without treatment. If a cyst becomes infected, treatment may include:  Opening and draining the cyst, done by a health care provider. After draining, minor surgery to  remove the rest of the cyst may be done.  Antibiotic medicine.  Injections of medicines (steroids) that help to reduce inflammation.  Surgery to remove the cyst. Surgery may be done if the cyst: ? Becomes large. ? Bothers you. ? Has a chance of turning into cancer.  Do not try to open a cyst yourself. Follow these instructions at home:  Take over-the-counter and prescription medicines only as told by your health care provider.  If you were prescribed an antibiotic medicine, take it it as told by your health care provider. Do not stop using the antibiotic even if you start to feel better.  Keep the area around your cyst clean and dry.  Wear loose, dry clothing.  Avoid touching your cyst.  Check your cyst every day for signs of infection. Check for: ? Redness, swelling, or pain. ? Fluid or blood. ? Warmth. ? Pus or a bad smell.  Keep all follow-up visits as told by your health care provider. This is important. How is this prevented?  Wear clean, dry, clothing.  Avoid wearing tight clothing.  Keep your skin clean and dry. Take showers or baths every day. Contact a health care provider if:  Your cyst develops symptoms of infection.  Your condition is not improving or is getting worse.  You develop a cyst that looks different from other cysts you have had.  You have a fever. Get help right away if:  Redness spreads from the cyst into the surrounding area. Summary  An epidermal cyst is a sac made of skin tissue. These cysts are   usually harmless (benign), and they may not cause symptoms unless they become infected.  If a cyst becomes infected, treatment may include surgery to open and drain the cyst, or to remove it. Treatment may also include medicines by mouth or through an injection.  Take over-the-counter and prescription medicines only as told by your health care provider. If you were prescribed an antibiotic medicine, take it as told by your health care  provider. Do not stop using the antibiotic even if you start to feel better.  Contact a health care provider if your condition is not improving or is getting worse.  Keep all follow-up visits as told by your health care provider. This is important. This information is not intended to replace advice given to you by your health care provider. Make sure you discuss any questions you have with your health care provider. Document Released: 04/08/2004 Document Revised: 08/29/2018 Document Reviewed: 11/19/2017 Elsevier Patient Education  2020 [employer].  Exercising to Lose Weight Exercise is structured, repetitive physical activity to improve fitness and health. Getting regular exercise is important for everyone. It is especially important if you are overweight. Being overweight increases your risk of heart disease, stroke, diabetes, high blood pressure, and several types of cancer. Reducing your calorie intake and exercising can help you lose weight. Exercise is usually categorized as moderate or vigorous intensity. To lose weight, most people need to do a certain amount of moderate-intensity or vigorous-intensity exercise each week. Moderate-intensity exercise  Moderate-intensity exercise is any activity that gets you moving enough to burn at least three times more energy (calories) than if you were sitting. Examples of moderate exercise include:  Walking a mile in 15 minutes.  Doing light yard work.  Biking at an easy pace. Most people should get at least 150 minutes (2 hours and 30 minutes) a week of moderate-intensity exercise to maintain their body weight. Vigorous-intensity exercise Vigorous-intensity exercise is any activity that gets you moving enough to burn at least six times more calories than if you were sitting. When you exercise at this intensity, you should be working hard enough that you are not able to carry on a conversation. Examples of vigorous exercise include:  Running.   Playing a team sport, such as football, basketball, and soccer.  Jumping rope. Most people should get at least 75 minutes (1 hour and 15 minutes) a week of vigorous-intensity exercise to maintain their body weight. How can exercise affect me? When you exercise enough to burn more calories than you eat, you lose weight. Exercise also reduces body fat and builds muscle. The more muscle you have, the more calories you burn. Exercise also:  Improves mood.  Reduces stress and tension.  Improves your overall fitness, flexibility, and endurance.  Increases bone strength. The amount of exercise you need to lose weight depends on:  Your age.  The type of exercise.  Any health conditions you have.  Your overall physical ability. Talk to your health care provider about how much exercise you need and what types of activities are safe for you. What actions can I take to lose weight? Nutrition   Make changes to your diet as told by your health care provider or diet and nutrition specialist (dietitian). This may include: ? Eating fewer calories. ? Eating more protein. ? Eating less unhealthy fats. ? Eating a diet that includes fresh fruits and vegetables, whole grains, low-fat dairy products, and lean protein. ? Avoiding foods with added fat, salt, and sugar.  Drink  plenty of water while you exercise to prevent dehydration or heat stroke. Activity  Choose an activity that you enjoy and set realistic goals. Your health care provider can help you make an exercise plan that works for you.  Exercise at a moderate or vigorous intensity most days of the week. ? The intensity of exercise may vary from person to person. You can tell how intense a workout is for you by paying attention to your breathing and heartbeat. Most people will notice their breathing and heartbeat get faster with more intense exercise.  Do resistance training twice each week, such as: ? Push-ups. ? Sit-ups. ? Lifting  weights. ? Using resistance bands.  Getting short amounts of exercise can be just as helpful as long structured periods of exercise. If you have trouble finding time to exercise, try to include exercise in your daily routine. ? Get up, stretch, and walk around every 30 minutes throughout the day. ? Go for a walk during your lunch break. ? Park your car farther away from your destination. ? If you take public transportation, get off one stop early and walk the rest of the way. ? Make phone calls while standing up and walking around. ? Take the stairs instead of elevators or escalators.  Wear comfortable clothes and shoes with good support.  Do not exercise so much that you hurt yourself, feel dizzy, or get very short of breath. Where to find more information  [place] Department of Health and Human Services: [employer]  Centers for Disease Control and Prevention (CDC): http://www.wolf.info/ Contact a health care provider:  Before starting a new exercise program.  If you have questions or concerns about your weight.  If you have a medical problem that keeps you from exercising. Get help right away if you have any of the following while exercising:  Injury.  Dizziness.  Difficulty breathing or shortness of breath that does not go away when you stop exercising.  Chest pain.  Rapid heartbeat. Summary  Being overweight increases your risk of heart disease, stroke, diabetes, high blood pressure, and several types of cancer.  Losing weight happens when you burn more calories than you eat.  Reducing the amount of calories you eat in addition to getting regular moderate or vigorous exercise each week helps you lose weight. This information is not intended to replace advice given to you by your health care provider. Make sure you discuss any questions you have with your health care provider. Document Released: 06/10/2010 Document Revised: 05/21/2017 Document Reviewed: 05/21/2017 Elsevier  Patient Education  2020 [employer].

## 2019-01-28 NOTE — Progress Notes (Addendum)
Established Patient Office Visit  Subjective:  Patient ID: Grace Gregory, female    DOB: 29-Dec-1960  Age: 58 y.o. MRN: [id number]  CC:  Chief Complaint  Patient presents with  . Follow-up    HPI Grace Gregory presents for follow-up of her recently diagnosed most diabetes.  She is learning to tolerate the Glucophage.  And believes that she be will be able to stick with it.  She saw the eye doctor the day after her diagnosis of diabetes and they did a dilated eye exam.  Everything is well with the back of her retina.  Dental check is scheduled for next month.  She is exercising on the elliptical and walking with her husband.  She has been able to lose a few pounds after meeting with the diabetic teaching nurse.  Blood pressure has been well controlled on 3 drug regimen.  She has a spot on the left side of her upper chest she would like me to take a look at because she has had an abnormal mammogram in the past.   Past Medical History:  Diagnosis Date  . Arthritis   . Arthritis   . GERD (gastroesophageal reflux disease)   . Hypertension   . Seasonal allergies   . Sleep apnea     Past Surgical History:  Procedure Laterality Date  . ABDOMINAL HYSTERECTOMY    . BACK SURGERY    . VULVECTOMY N/A 08/06/2018   Procedure: WIDE LOCAL EXCISION VULVA;  Surgeon: Everitt Amber, MD;  Location: [facility];  Service: Gynecology;  Laterality: N/A;    Family History  Problem Relation Age of Onset  . Hypertension Mother   . Heart disease Father   . Hypertension Sister   . Hypertension Brother   . Hypertension Sister   . Hypertension Sister   . Cancer Brother   . Hypertension Brother     Social History   Socioeconomic History  . Marital status: Married    Spouse name: Not on file  . Number of children: Not on file  . Years of education: Not on file  . Highest education level: Not on file  Occupational History  . Not on file  Social Needs  . Financial  resource strain: Not on file  . Food insecurity    Worry: Not on file    Inability: Not on file  . Transportation needs    Medical: Not on file    Non-medical: Not on file  Tobacco Use  . Smoking status: Never Smoker  . Smokeless tobacco: Never Used  Substance and Sexual Activity  . Alcohol use: Never    Frequency: Never  . Drug use: Never  . Sexual activity: Yes    Birth control/protection: None  Lifestyle  . Physical activity    Days per week: Not on file    Minutes per session: Not on file  . Stress: Not on file  Relationships  . Social Herbalist on phone: Not on file    Gets together: Not on file    Attends religious service: Not on file    Active member of club or organization: Not on file    Attends meetings of clubs or organizations: Not on file    Relationship status: Not on file  . Intimate partner violence    Fear of current or ex partner: Not on file    Emotionally abused: Not on file    Physically abused: Not on file  Forced sexual activity: Not on file  Other Topics Concern  . Not on file  Social History Narrative  . Not on file    Outpatient Medications Prior to Visit  Medication Sig Dispense Refill  . azelastine (ASTELIN) 0.1 % nasal spray Place into both nostrils 2 (two) times daily. Use in each nostril as directed    . budesonide-formoterol (SYMBICORT) 160-4.5 MCG/ACT inhaler Inhale 2 puffs into the lungs 2 (two) times daily.    . cetirizine (ZYRTEC) 10 MG tablet Take 10 mg by mouth daily.    . clobetasol cream (TEMOVATE) [id number] % Apply 1 application topically 2 (two) times daily. 30 g 0  . estradiol (DOTTI) 0.05 MG/24HR patch Place 1 patch (0.05 mg total) onto the skin 2 (two) times a week. 8 patch 12  . fluconazole (DIFLUCAN) 150 MG tablet Take one daily for 7 days. 7 tablet 0  . gabapentin (NEURONTIN) 300 MG capsule TAKE 1 CAPSULE BY MOUTH TWICE DAILY 60 capsule 3  . hydrochlorothiazide (HYDRODIURIL) 12.5 MG tablet TAKE 1 TABLET BY MOUTH  ONCE DAILY 90 tablet 1  . ibuprofen (ADVIL,MOTRIN) 800 MG tablet Take 1 tablet (800 mg total) by mouth every 8 (eight) hours as needed. 30 tablet 0  . losartan (COZAAR) 50 MG tablet TAKE 1 TABLET BY MOUTH ONCE DAILY 90 tablet 1  . metFORMIN (GLUCOPHAGE) 500 MG tablet Take 1 tablet (500 mg total) by mouth 2 (two) times daily with a meal. 180 tablet 0  . metoprolol succinate (TOPROL-XL) 25 MG 24 hr tablet TAKE 1 TABLET BY MOUTH ONCE DAILY 90 tablet 1  . mometasone (NASONEX) 50 MCG/ACT nasal spray PLACE 1 SPRAY IN EACH NOSTRIL. MAY INCREASE TO 2 SPRAYS IN EACH NOSTRIL AS TOLERATED 17 g 2  . montelukast (SINGULAIR) 10 MG tablet Take 1 tablet (10 mg total) by mouth daily. 90 tablet 2  . omeprazole (PRILOSEC) 40 MG capsule TAKE 1 CAPSULE (40 MG TOTAL) BY MOUTH DAILY. 90 capsule 2  . PROAIR HFA 108 (90 Base) MCG/ACT inhaler Use as directed    . senna (SENOKOT) 8.6 MG TABS tablet Take 1 tablet (8.6 mg total) by mouth at bedtime. 120 each 0   No facility-administered medications prior to visit.     Allergies  Allergen Reactions  . Lisinopril Cough  . Penicillins Rash    ROS Review of Systems  Constitutional: Negative.   HENT: Negative.   Eyes: Negative for photophobia and visual disturbance.  Respiratory: Negative.   Cardiovascular: Negative.   Gastrointestinal: Negative.   Endocrine: Negative for polyphagia and polyuria.  Genitourinary: Negative.   Musculoskeletal: Negative for gait problem and joint swelling.  Skin: Negative for pallor and rash.  Hematological: Does not bruise/bleed easily.  Psychiatric/Behavioral: Negative.       Objective:    Physical Exam  Constitutional: She is oriented to person, place, and time. She appears well-developed and well-nourished. No distress.  HENT:  Head: Normocephalic and atraumatic.  Right Ear: External ear normal.  Left Ear: External ear normal.  Mouth/Throat: Oropharynx is clear and moist. No oropharyngeal exudate.  Eyes: Pupils are  equal, round, and reactive to light. Conjunctivae are normal. Right eye exhibits no discharge. Left eye exhibits no discharge. No scleral icterus.  Neck: Neck supple. No JVD present. No tracheal deviation present. No thyromegaly present.  Cardiovascular: Normal rate, regular rhythm and normal heart sounds.  Pulmonary/Chest: Effort normal and breath sounds normal. No stridor.  Lymphadenopathy:    She has no cervical adenopathy.  Neurological:  She is alert and oriented to person, place, and time.  Skin: Skin is warm and dry. She is not diaphoretic.     Psychiatric: She has a normal mood and affect. Her behavior is normal.   Diabetic Foot Exam - Simple   Simple Foot Form Diabetic Foot exam was performed with the following findings: Yes 01/28/2019  1:32 PM  Visual Inspection No deformities, no ulcerations, no other skin breakdown bilaterally: Yes Sensation Testing Intact to touch and monofilament testing bilaterally: Yes Pulse Check Posterior Tibialis and Dorsalis pulse intact bilaterally: Yes Comments    BP 130/80   Pulse 96   Ht 5\' 2"  (1.575 m)   Wt 240 lb (108.9 kg)   LMP  (LMP Unknown)   SpO2 98%   BMI 43.90 kg/m  Wt Readings from Last 3 Encounters:  01/28/19 240 lb (108.9 kg)  12/30/18 242 lb 6 oz (109.9 kg)  08/21/18 240 lb 14.4 oz (109.3 kg)   BP Readings from Last 3 Encounters:  01/28/19 130/80  12/30/18 128/78  08/21/18 138/85   Guideline developer:  UpToDate (see UpToDate for funding source) Date Released: June 2014  Health Maintenance Due  Topic Date Due  . Hepatitis C Screening  July 02, 1960  . PNEUMOCOCCAL POLYSACCHARIDE VACCINE AGE 35-64 HIGH RISK  12/15/1961  . FOOT EXAM  12/15/1969  . OPHTHALMOLOGY EXAM  12/15/1969    There are no preventive care reminders to display for this patient.  Lab Results  Component Value Date   TSH 3.09 12/31/2018   Lab Results  Component Value Date   WBC 5.0 12/31/2018   HGB 13.3 12/31/2018   HCT 40.1 12/31/2018   MCV  87.3 12/31/2018   PLT 274.0 12/31/2018   Lab Results  Component Value Date   NA 136 12/31/2018   K 3.8 12/31/2018   CO2 27 12/31/2018   GLUCOSE 246 (H) 12/31/2018   BUN 14 12/31/2018   CREATININE 0.78 12/31/2018   BILITOT 0.3 12/31/2018   ALKPHOS 82 12/31/2018   AST 18 12/31/2018   ALT 24 12/31/2018   PROT 7.4 12/31/2018   ALBUMIN 4.0 12/31/2018   CALCIUM 9.5 12/31/2018   ANIONGAP 8 08/06/2018   GFR 91.45 12/31/2018   Lab Results  Component Value Date   CHOL 199 12/31/2018   Lab Results  Component Value Date   HDL 50.30 12/31/2018   Lab Results  Component Value Date   LDLCALC 127 (H) 12/31/2018   Lab Results  Component Value Date   TRIG 111.0 12/31/2018   Lab Results  Component Value Date   CHOLHDL 4 12/31/2018   Lab Results  Component Value Date   HGBA1C 10.3 (H) 12/31/2018      Assessment & Plan:   Problem List Items Addressed This Visit      Cardiovascular and Mediastinum   Essential hypertension     Endocrine   Type 2 diabetes mellitus with hyperglycemia, without long-term current use of insulin (HCC)     Other   Inclusion cyst   Need for influenza vaccination - Primary   Relevant Orders   Flu Vaccine QUAD 36+ mos IM (Completed)      No orders of the defined types were placed in this encounter.   Follow-up: Return in about 3 months (around 04/29/2019).    Patient will continue exercising and her weight loss efforts along with all of her current medications.  She was given information on inclusion cyst and exercising to lose weight.  She will follow-up with me  with any changes in the inclusion cyst.  Cyst.  Discussed I&D if need be and or referral for excision.  Will discuss cholesterol next visit in 3 months.

## 2019-01-28 NOTE — Telephone Encounter (Signed)
Copied from [facility] [id number]. Topic: General - Other >> Jan 28, 2019  3:29 PM Keene Breath wrote: Reason for CRM: Pharmacy called to ask the doctor if patient could get an alternative test strips that the insurance would accept.  Please call to discuss and send a new script.  CB# [phone]

## 2019-01-28 NOTE — Patient Instructions (Signed)
Start balancing meals and snacks with carbs and protein. Pay attention to which foods have carbohydrates in them and practice carb counting.   See you at your follow up!

## 2019-01-28 NOTE — Progress Notes (Signed)
Diabetes Self-Management Education  Visit Type: First/Initial  Appt. Start Time: 9:45am Appt. End Time: 10:55am  01/28/2019  Grace Gregory, identified by name and date of birth, is a 58 y.o. female with a diagnosis of Diabetes: Type 2.   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 States she is willing to manage through diet and exercise, and is already somewhat familiar with diabetes. States one of her grandparents had diabetes, otherwise family medical hx primarily involves hypertension.   Patient states that her first meal is often around 10 or 11am, because she likes to sleep in. Her first meal is often a sandwich, or maybe eggs and toast. States that if she eats a sandwich for breakfast, she skips eating bread at lunch. Snacks include nuts, chips, fruits, and french fries. Occasionally eats a Payday bar.   States she is lactose intolerant, so drinks almond milk or Fairlife. Sometimes will make smoothies with fruit and maybe peanut butter. Drinks primarily include coffee (1 cup/day with flavored creamer & 1 Splenda packet), water, and flavored water. Drinks 3-4 bottles of plain water daily. Used to drink 1 [employer] every weekend, but has not since diagnosed. May have a margarita or beer 2x/month.   States her biggest issues regarding her diet are eating enough vegetables and making good choices when traveling (eating out.)    Diabetes Self-Management Education - 01/28/19 0945      Visit Information   Visit Type  First/Initial      Initial Visit   Diabetes Type  Type 2    Are you currently following a meal plan?  No    Are you taking your medications as prescribed?  Yes    Date Diagnosed  August 2020      Health Coping   How would you rate your overall health?  Good      Psychosocial Assessment   Patient Belief/Attitude about Diabetes  Motivated to manage diabetes    Self-care barriers  None    Self-management support   Doctor's office    Other persons present  Patient    Special Needs  None    Preferred Learning Style  No preference indicated    Learning Readiness  Ready    How often do you need to have someone help you when you read instructions, pamphlets, or other written materials from your doctor or pharmacy?  1 - Never    What is the last grade level you completed in school?  college      Complications   Last HgB A1C per patient/outside source  10.3 %    How often do you check your blood sugar?  0 times/day (not testing)    Have you had a dilated eye exam in the past 12 months?  Yes    Have you had a dental exam in the past 12 months?  Yes    Are you checking your feet?  No      Dietary Intake   Breakfast  usually misses (sleeps in)    Snack (morning)  eggs + toast   or tomato sandwich (or banana & cheese sandwich)   Lunch  chicken breast + rice   or V8 + stir fry rice/meat   Snack (afternoon)  sweet potato chips   or pea chips   Dinner  boiled egg + string cheese + banana   or ground chicken spaghetti + salad (or pizza)   Beverage(s)  water, flavored  water, lactose-free milk      Exercise   Exercise Type  ADL's;Light (walking / raking leaves)    How many days per week to you exercise?  2    How many minutes per day do you exercise?  20    Total minutes per week of exercise  40      Patient Education   Previous Diabetes Education  Yes (please comment)   2 years ago- received prediabetes education   Disease state   Definition of diabetes, type 1 and 2, and the diagnosis of diabetes;Factors that contribute to the development of diabetes;Explored patient's options for treatment of their diabetes    Nutrition management   Role of diet in the treatment of diabetes and the relationship between the three main macronutrients and blood glucose level;Food label reading, portion sizes and measuring food.;Carbohydrate counting;Reviewed blood glucose goals for pre and post meals and how to evaluate  the patients' food intake on their blood glucose level.    Medications  Reviewed medication adjustment guidelines for hyperglycemia and sick days.    Monitoring  Taught/evaluated SMBG meter.;Purpose and frequency of SMBG.;Taught/discussed recording of test results and interpretation of SMBG.;Interpreting lab values - A1C, lipid, urine microalbumina.;Identified appropriate SMBG and/or A1C goals.    Acute complications  Discussed and identified patients' treatment of hyperglycemia.;Taught treatment of hypoglycemia - the 15 rule.;Covered sick day management with medication and food.    Chronic complications  Relationship between chronic complications and blood glucose control      Individualized Goals (developed by patient)   Nutrition  General guidelines for healthy choices and portions discussed    Medications  take my medication as prescribed    Monitoring   test my blood glucose as discussed    Reducing Risk  examine blood glucose patterns;treat hypoglycemia with 15 grams of carbs if blood glucose less than 70mg /dL      Post-Education Assessment   Patient understands incorporating nutritional management into lifestyle.  Needs Review    Patient undertands incorporating physical activity into lifestyle.  Needs Instruction    Patient understands monitoring blood glucose, interpreting and using results  Needs Review    Patient understands prevention, detection, and treatment of acute complications.  Needs Review    Patient understands prevention, detection, and treatment of chronic complications.  Needs Instruction    Patient understands how to develop strategies to address psychosocial issues.  Needs Instruction    Patient understands how to develop strategies to promote health/change behavior.  Needs Instruction      Outcomes   Expected Outcomes  Demonstrated interest in learning. Expect positive outcomes    Future DMSE  4-6 wks    Program Status  Not Completed       Individualized Plan  for Diabetes Self-Management Training:   Learning Objective:  Patient will have a greater understanding of diabetes self-management. Patient education plan is to attend individual and/or group sessions per assessed needs and concerns.   Plan:  Patient Instructions  Start balancing meals and snacks with carbs and protein. Pay attention to which foods have carbohydrates in them and practice carb counting.   See you at your follow up!  Expected Outcomes:  Demonstrated interest in learning. Expect positive outcomes  Education material provided: ADA - How to Thrive: A Guide for Your Journey with Diabetes, A1C conversion sheet, Meal plan card, My Plate, Snack sheet and Carbohydrate counting sheet, Types of Sugar  Meter Provided:  Contour Next  Lot#: [id number] Exp: 03/22/2019  RD's Notes for Next Visit:   Review Carb Counting (esp Nutrition Facts Label)   Physical Activity   Acute/Chronic Complications   Strategies for Eating Out/Traveling  If problems or questions, patient to contact team via:  Phone and Email  Future DSME appointment: 4-6 wks

## 2019-01-29 MED ORDER — FREESTYLE LANCETS MISC: 12 refills | Status: AC

## 2019-01-29 MED ORDER — FREESTYLE LITE DEVI: 0 refills | Status: DC

## 2019-01-29 MED ORDER — FREESTYLE LITE TEST VI STRP: ORAL_STRIP | 12 refills | Status: AC

## 2019-01-29 MED FILL — FREESTYLE LITE METER: 30 days supply | Qty: 1 | Fill #0

## 2019-01-29 MED FILL — FREESTYLE LITE TEST STRIP: 50 days supply | Qty: 100 | Fill #0

## 2019-01-29 MED FILL — FREESTYLE LANCETS: 50 days supply | Qty: 100 | Fill #0

## 2019-01-29 NOTE — Telephone Encounter (Signed)
Spoke with pharmacy. Rx sent for Freestyle as that is what insurance will cover.

## 2019-01-29 NOTE — Addendum Note (Signed)
Addended by: Rodrigo Ran on: 01/29/2019 10:37 AM   Modules accepted: Orders

## 2019-02-05 ENCOUNTER — Encounter: Payer: Self-pay | Admitting: Family Medicine

## 2019-02-05 DIAGNOSIS — J301 Allergic rhinitis due to pollen: Secondary | ICD-10-CM | POA: Diagnosis not present

## 2019-02-05 DIAGNOSIS — J3089 Other allergic rhinitis: Secondary | ICD-10-CM | POA: Diagnosis not present

## 2019-02-06 ENCOUNTER — Other Ambulatory Visit: Payer: Self-pay

## 2019-02-06 DIAGNOSIS — G4733 Obstructive sleep apnea (adult) (pediatric): Secondary | ICD-10-CM

## 2019-02-07 MED FILL — DOTTI 0.05 MG/24HR PTTW: 0.05 | 28 days supply | Qty: 8 | Fill #5

## 2019-02-12 DIAGNOSIS — J3089 Other allergic rhinitis: Secondary | ICD-10-CM | POA: Diagnosis not present

## 2019-02-12 DIAGNOSIS — J301 Allergic rhinitis due to pollen: Secondary | ICD-10-CM | POA: Diagnosis not present

## 2019-02-17 MED FILL — SYMBICORT 160-4.5 MCG INH: 160-4.5 | 30 days supply | Qty: 10 | Fill #2

## 2019-02-18 ENCOUNTER — Other Ambulatory Visit: Payer: Self-pay

## 2019-02-18 ENCOUNTER — Ambulatory Visit
Admission: RE | Admit: 2019-02-18 | Discharge: 2019-02-18 | Disposition: A | Discharge status: Home / Self Care | Payer: 59 | Source: Ambulatory Visit | Attending: Family Medicine | Admitting: Family Medicine

## 2019-02-18 DIAGNOSIS — J3089 Other allergic rhinitis: Secondary | ICD-10-CM | POA: Diagnosis not present

## 2019-02-18 DIAGNOSIS — Z1231 Encounter for screening mammogram for malignant neoplasm of breast: Secondary | ICD-10-CM | POA: Diagnosis not present

## 2019-02-18 DIAGNOSIS — J301 Allergic rhinitis due to pollen: Secondary | ICD-10-CM | POA: Diagnosis not present

## 2019-02-19 ENCOUNTER — Encounter: Payer: Self-pay | Admitting: Family Medicine

## 2019-02-20 ENCOUNTER — Encounter: Payer: Self-pay | Admitting: Family Medicine

## 2019-02-21 MED FILL — GABAPENTIN 300 MG CAPSULE: 300 | 30 days supply | Qty: 60 | Fill #3

## 2019-02-26 DIAGNOSIS — J301 Allergic rhinitis due to pollen: Secondary | ICD-10-CM | POA: Diagnosis not present

## 2019-02-26 DIAGNOSIS — J3089 Other allergic rhinitis: Secondary | ICD-10-CM | POA: Diagnosis not present

## 2019-02-27 ENCOUNTER — Other Ambulatory Visit: Payer: Self-pay

## 2019-02-27 ENCOUNTER — Encounter: Payer: Self-pay | Admitting: Family Medicine

## 2019-02-27 ENCOUNTER — Ambulatory Visit: Payer: 59 | Admitting: Family Medicine

## 2019-02-27 VITALS — BP 128/80 | HR 84 | Ht 62.0 in | Wt 239.0 lb

## 2019-02-27 DIAGNOSIS — E1165 Type 2 diabetes mellitus with hyperglycemia: Secondary | ICD-10-CM | POA: Diagnosis not present

## 2019-02-27 DIAGNOSIS — R519 Headache, unspecified: Secondary | ICD-10-CM | POA: Diagnosis not present

## 2019-02-27 MED ORDER — PREDNISONE 10 MG PO TABS: 10.0000 mg | ORAL_TABLET | Freq: Two times a day (BID) | ORAL | 0 refills | Status: AC

## 2019-02-27 MED FILL — predniSONE 10 MG TABS: 10 | 5 days supply | Qty: 10 | Fill #0

## 2019-02-27 NOTE — Progress Notes (Signed)
Established Patient Office Visit  Subjective:  Patient ID: Grace Gregory, female    DOB: [birth year]  Age: 58 y.o. MRN: [id number]  CC:  Chief Complaint  Patient presents with   Headache    since monday, pressure in sinuses & back of head on the right side.    HPI Grace Gregory presents for treatment and evaluation of a 5-day history of a pain in her face over her cheekbones and the right posterior section of her head.  She has had no fever or chills drainage or rhinorrhea.  There is been no neck pain or radiation of pain into her arms.  Denies any changes in vision.  No difficulty swallowing.  On multiple medications for year round allergies.  She goes for allergy desensitization therapy twice weekly.  She is doing well with the Glucophage.  There were 2 weeks of GI upset when she started the medication but that is mostly past.  She has been able to lose a little bit of weight.  Fasting sugars are running in the 90-1 30 range.  She is otherwise tolerating the medicine well.  Past Medical History:  Diagnosis Date   Arthritis    Arthritis    GERD (gastroesophageal reflux disease)    Hypertension    Seasonal allergies    Sleep apnea     Past Surgical History:  Procedure Laterality Date   ABDOMINAL HYSTERECTOMY     BACK SURGERY     VULVECTOMY N/A 08/06/2018   Procedure: WIDE LOCAL EXCISION VULVA;  Surgeon: Everitt Amber, MD;  Location: [facility];  Service: Gynecology;  Laterality: N/A;    Family History  Problem Relation Age of Onset   Hypertension Mother    Heart disease Father    Hypertension Sister    Hypertension Brother    Hypertension Sister    Hypertension Sister    Cancer Brother    Hypertension Brother     Social History   Socioeconomic History   Marital status: Married    Spouse name: Not on file   Number of children: Not on file   Years of education: Not on file   Highest education level: Not on file    Occupational History   Not on file  Social Needs   Financial resource strain: Not on file   Food insecurity    Worry: Not on file    Inability: Not on file   Transportation needs    Medical: Not on file    Non-medical: Not on file  Tobacco Use   Smoking status: Never Smoker   Smokeless tobacco: Never Used  Substance and Sexual Activity   Alcohol use: Never    Frequency: Never   Drug use: Never   Sexual activity: Yes    Birth control/protection: None  Lifestyle   Physical activity    Days per week: Not on file    Minutes per session: Not on file   Stress: Not on file  Relationships   Social connections    Talks on phone: Not on file    Gets together: Not on file    Attends religious service: Not on file    Active member of club or organization: Not on file    Attends meetings of clubs or organizations: Not on file    Relationship status: Not on file   Intimate partner violence    Fear of current or ex partner: Not on file    Emotionally abused: Not on  file    Physically abused: Not on file    Forced sexual activity: Not on file  Other Topics Concern   Not on file  Social History Narrative   Not on file    Outpatient Medications Prior to Visit  Medication Sig Dispense Refill   azelastine (ASTELIN) 0.1 % nasal spray Place into both nostrils 2 (two) times daily. Use in each nostril as directed     Blood Glucose Monitoring Suppl (FREESTYLE LITE) DEVI Use to test blood sugars 1-2 times daily. 1 each 0   budesonide-formoterol (SYMBICORT) 160-4.5 MCG/ACT inhaler Inhale 2 puffs into the lungs 2 (two) times daily.     cetirizine (ZYRTEC) 10 MG tablet Take 10 mg by mouth daily.     clobetasol cream (TEMOVATE) [id number] % Apply 1 application topically 2 (two) times daily. 30 g 0   estradiol (DOTTI) 0.05 MG/24HR patch Place 1 patch (0.05 mg total) onto the skin 2 (two) times a week. 8 patch 12   fluconazole (DIFLUCAN) 150 MG tablet Take one daily for 7 days. 7  tablet 0   gabapentin (NEURONTIN) 300 MG capsule TAKE 1 CAPSULE BY MOUTH TWICE DAILY 60 capsule 3   glucose blood (FREESTYLE LITE) test strip Use to test blood sugars 1-2 times daily. 100 each 12   hydrochlorothiazide (HYDRODIURIL) 12.5 MG tablet TAKE 1 TABLET BY MOUTH ONCE DAILY 90 tablet 1   ibuprofen (ADVIL,MOTRIN) 800 MG tablet Take 1 tablet (800 mg total) by mouth every 8 (eight) hours as needed. 30 tablet 0   Lancets (FREESTYLE) lancets Use to test blood sugars 1-2 times daily. 100 each 12   losartan (COZAAR) 50 MG tablet TAKE 1 TABLET BY MOUTH ONCE DAILY 90 tablet 1   metFORMIN (GLUCOPHAGE) 500 MG tablet Take 1 tablet (500 mg total) by mouth 2 (two) times daily with a meal. 180 tablet 0   metoprolol succinate (TOPROL-XL) 25 MG 24 hr tablet TAKE 1 TABLET BY MOUTH ONCE DAILY 90 tablet 1   mometasone (NASONEX) 50 MCG/ACT nasal spray PLACE 1 SPRAY IN EACH NOSTRIL. MAY INCREASE TO 2 SPRAYS IN EACH NOSTRIL AS TOLERATED 17 g 2   montelukast (SINGULAIR) 10 MG tablet Take 1 tablet (10 mg total) by mouth daily. 90 tablet 2   omeprazole (PRILOSEC) 40 MG capsule TAKE 1 CAPSULE (40 MG TOTAL) BY MOUTH DAILY. 90 capsule 2   PROAIR HFA 108 (90 Base) MCG/ACT inhaler Use as directed     senna (SENOKOT) 8.6 MG TABS tablet Take 1 tablet (8.6 mg total) by mouth at bedtime. 120 each 0   No facility-administered medications prior to visit.     Allergies  Allergen Reactions   Lisinopril Cough   Penicillins Rash    ROS Review of Systems  Constitutional: Negative for chills, diaphoresis, fatigue, fever and unexpected weight change.  HENT: Positive for sinus pain. Negative for postnasal drip, rhinorrhea, sore throat, trouble swallowing and voice change.   Eyes: Negative for photophobia and visual disturbance.  Respiratory: Negative for cough and shortness of breath.   Cardiovascular: Negative.   Gastrointestinal: Negative.   Genitourinary: Negative.   Musculoskeletal: Negative for  arthralgias and myalgias.  Skin: Negative for pallor and rash.  Allergic/Immunologic: Negative for immunocompromised state.  Neurological: Positive for headaches. Negative for speech difficulty, light-headedness and numbness.  Hematological: Does not bruise/bleed easily.  Psychiatric/Behavioral: Negative.       Objective:    Physical Exam  Constitutional: She is oriented to person, place, and time. She appears well-developed  and well-nourished. No distress.  HENT:  Head: Normocephalic and atraumatic.  Right Ear: External ear normal.  Left Ear: External ear normal.  Mouth/Throat: Oropharynx is clear and moist. No oropharyngeal exudate.  Eyes: Pupils are equal, round, and reactive to light. Conjunctivae are normal. Right eye exhibits no discharge. Left eye exhibits no discharge. No scleral icterus.  Neck: Neck supple. No JVD present. No tracheal deviation present. No thyromegaly present.  Cardiovascular: Normal rate, regular rhythm and normal heart sounds.  Pulmonary/Chest: Effort normal and breath sounds normal. No stridor. No respiratory distress. She has no wheezes. She has no rales.  Musculoskeletal:        General: No edema.     Cervical back: She exhibits normal range of motion, no tenderness and no bony tenderness.  Lymphadenopathy:    She has no cervical adenopathy.  Neurological: She is alert and oriented to person, place, and time.  Neg [place].   Skin: Skin is warm and dry. She is not diaphoretic.  Psychiatric: She has a normal mood and affect. Her behavior is normal.    BP 128/80    Pulse 84    Ht 5\' 2"  (1.575 m)    Wt 239 lb (108.4 kg)    LMP  (LMP Unknown)    SpO2 96%    BMI 43.71 kg/m  Wt Readings from Last 3 Encounters:  02/27/19 239 lb (108.4 kg)  01/28/19 240 lb (108.9 kg)  12/30/18 242 lb 6 oz (109.9 kg)   BP Readings from Last 3 Encounters:  02/27/19 128/80  01/28/19 130/80  12/30/18 128/78   Guideline developer:  UpToDate (see UpToDate for funding  source) Date Released: June 2014  Health Maintenance Due  Topic Date Due   Hepatitis C Screening  09-25-1960   PNEUMOCOCCAL POLYSACCHARIDE VACCINE AGE 61-64 HIGH RISK  12/15/1961    There are no preventive care reminders to display for this patient.  Lab Results  Component Value Date   TSH 3.09 12/31/2018   Lab Results  Component Value Date   WBC 5.0 12/31/2018   HGB 13.3 12/31/2018   HCT 40.1 12/31/2018   MCV 87.3 12/31/2018   PLT 274.0 12/31/2018   Lab Results  Component Value Date   NA 136 12/31/2018   K 3.8 12/31/2018   CO2 27 12/31/2018   GLUCOSE 246 (H) 12/31/2018   BUN 14 12/31/2018   CREATININE 0.78 12/31/2018   BILITOT 0.3 12/31/2018   ALKPHOS 82 12/31/2018   AST 18 12/31/2018   ALT 24 12/31/2018   PROT 7.4 12/31/2018   ALBUMIN 4.0 12/31/2018   CALCIUM 9.5 12/31/2018   ANIONGAP 8 08/06/2018   GFR 91.45 12/31/2018   Lab Results  Component Value Date   CHOL 199 12/31/2018   Lab Results  Component Value Date   HDL 50.30 12/31/2018   Lab Results  Component Value Date   LDLCALC 127 (H) 12/31/2018   Lab Results  Component Value Date   TRIG 111.0 12/31/2018   Lab Results  Component Value Date   CHOLHDL 4 12/31/2018   Lab Results  Component Value Date   HGBA1C 10.3 (H) 12/31/2018      Assessment & Plan:   Problem List Items Addressed This Visit      Endocrine   Type 2 diabetes mellitus with hyperglycemia, without long-term current use of insulin (HCC)     Other   Sinus headache - Primary   Relevant Medications   predniSONE (DELTASONE) 10 MG tablet  Meds ordered this encounter  Medications   predniSONE (DELTASONE) 10 MG tablet    Sig: Take 1 tablet (10 mg total) by mouth 2 (two) times daily with a meal for 5 days.    Dispense:  10 tablet    Refill:  0    Follow-up: Return if symptoms worsen or fail to improve.   Follow-up with me Monday if not improving with the prednisone therapy.

## 2019-03-04 ENCOUNTER — Encounter: Payer: 59 | Attending: Family Medicine | Admitting: Dietician

## 2019-03-04 ENCOUNTER — Encounter: Payer: Self-pay | Admitting: Dietician

## 2019-03-04 ENCOUNTER — Other Ambulatory Visit: Payer: Self-pay

## 2019-03-04 DIAGNOSIS — E1165 Type 2 diabetes mellitus with hyperglycemia: Secondary | ICD-10-CM | POA: Insufficient documentation

## 2019-03-04 DIAGNOSIS — J301 Allergic rhinitis due to pollen: Secondary | ICD-10-CM | POA: Diagnosis not present

## 2019-03-04 DIAGNOSIS — J3089 Other allergic rhinitis: Secondary | ICD-10-CM | POA: Diagnosis not present

## 2019-03-04 NOTE — Progress Notes (Signed)
Diabetes Self-Management Education  Visit Type: Follow-up  Appt. Start Time: 11:15am Appt. End Time: 11:50am  03/05/2019  [name], identified by name and date of birth, is a 58 y.o. female with a diagnosis of Diabetes: Type 2.   Primary concerns today: eating out tips, plant-based protein  ASSESSMENT  Patient is doing great and, again, asked many great questions throughout today's appointment. States she is satisfied with her progress made thus far in controlling her blood sugar better and understanding how to make better food choices.   Patient states that she has not been eating as much bread, and no longer eats soft drinks or candy. States she has focused on carb counting which has helped lower her blood sugar levels, both fasting and post prandial. States she is better able to see how foods correspond with her blood sugar. States her goals are to increase vegetable intake and focus on consuming plant-based protein, rather than animal-based.   Diabetes Self-Management Education - 03/04/19 1116      Visit Information   Visit Type  Follow-up      Complications   How often do you check your blood sugar?  1-2 times/day    Fasting Blood glucose range (mg/dL)  70-129    Postprandial Blood glucose range (mg/dL)  130-179      Individualized Goals (developed by patient)   Nutrition  Follow meal plan discussed   Increase vegetable and protein intake (plant proteins)   Medications  take my medication as prescribed    Monitoring   test my blood glucose as discussed      Outcomes   Expected Outcomes  Demonstrated interest in learning. Expect positive outcomes    Future DMSE  PRN    Program Status  Completed      Subsequent Visit   Since your last visit have you continued or begun to take your medications as prescribed?  Yes    Since your last visit have you had your blood pressure checked?  Yes    Is your most recent blood pressure lower, unchanged, or higher since your last  visit?  Lower    Since your last visit have you experienced any weight changes?  Loss    Weight Loss (lbs)  2    Since your last visit, are you checking your blood glucose at least once a day?  No   most days during the week      Individualized Plan for Diabetes Self-Management Training:  Learning Objective:  Patient will have a greater understanding of diabetes self-management. Patient education plan is to attend individual and/or group sessions per assessed needs and concerns.   Expected Outcomes:  Demonstrated interest in learning. Expect positive outcomes  Education material provided: Plant Protein, Plant-Based 3 Day Sample Meal Plan, Tips for Eating Out with Diabetes  If problems or questions, patient to contact team via:  Phone and Email  Future DSME appointment: PRN

## 2019-03-05 ENCOUNTER — Other Ambulatory Visit: Payer: Self-pay

## 2019-03-05 ENCOUNTER — Encounter: Payer: Self-pay | Admitting: Family Medicine

## 2019-03-05 DIAGNOSIS — Z20822 Contact with and (suspected) exposure to covid-19: Secondary | ICD-10-CM

## 2019-03-05 DIAGNOSIS — Z20828 Contact with and (suspected) exposure to other viral communicable diseases: Secondary | ICD-10-CM | POA: Diagnosis not present

## 2019-03-05 MED ORDER — IBUPROFEN 800 MG PO TABS: 800.0000 mg | ORAL_TABLET | Freq: Three times a day (TID) | ORAL | 0 refills | Status: DC | PRN

## 2019-03-05 MED FILL — IBUPROFEN 800 MG TAB: 800 | 10 days supply | Qty: 30 | Fill #0

## 2019-03-07 ENCOUNTER — Other Ambulatory Visit: Payer: Self-pay

## 2019-03-07 ENCOUNTER — Encounter (HOSPITAL_BASED_OUTPATIENT_CLINIC_OR_DEPARTMENT_OTHER): Payer: Self-pay | Admitting: *Deleted

## 2019-03-07 ENCOUNTER — Encounter: Payer: Self-pay | Admitting: Family Medicine

## 2019-03-07 ENCOUNTER — Emergency Department (HOSPITAL_BASED_OUTPATIENT_CLINIC_OR_DEPARTMENT_OTHER): Payer: 59

## 2019-03-07 ENCOUNTER — Emergency Department (HOSPITAL_BASED_OUTPATIENT_CLINIC_OR_DEPARTMENT_OTHER)
Admission: EM | Admit: 2019-03-07 | Discharge: 2019-03-07 | Disposition: A | Discharge status: Home / Self Care | Payer: 59 | Attending: Emergency Medicine | Admitting: Emergency Medicine

## 2019-03-07 DIAGNOSIS — R519 Headache, unspecified: Secondary | ICD-10-CM | POA: Insufficient documentation

## 2019-03-07 DIAGNOSIS — E119 Type 2 diabetes mellitus without complications: Secondary | ICD-10-CM | POA: Insufficient documentation

## 2019-03-07 DIAGNOSIS — Z7984 Long term (current) use of oral hypoglycemic drugs: Secondary | ICD-10-CM | POA: Insufficient documentation

## 2019-03-07 DIAGNOSIS — Z79899 Other long term (current) drug therapy: Secondary | ICD-10-CM | POA: Diagnosis not present

## 2019-03-07 DIAGNOSIS — I10 Essential (primary) hypertension: Secondary | ICD-10-CM | POA: Diagnosis not present

## 2019-03-07 LAB — CBC WITH DIFFERENTIAL/PLATELET
Abs Immature Granulocytes: 0.02 10*3/uL (ref 0.00–0.07)
Basophils Absolute: 0 10*3/uL (ref 0.0–0.1)
Basophils Relative: 0 %
Eosinophils Absolute: 0.2 10*3/uL (ref 0.0–0.5)
Eosinophils Relative: 3 %
HCT: 42.5 % (ref 36.0–46.0)
Hemoglobin: 13.3 g/dL (ref 12.0–15.0)
Immature Granulocytes: 0 %
Lymphocytes Relative: 36 %
Lymphs Abs: 2.8 10*3/uL (ref 0.7–4.0)
MCH: 28.4 pg (ref 26.0–34.0)
MCHC: 31.3 g/dL (ref 30.0–36.0)
MCV: 90.6 fL (ref 80.0–100.0)
Monocytes Absolute: 0.6 10*3/uL (ref 0.1–1.0)
Monocytes Relative: 7 %
Neutro Abs: 4.2 10*3/uL (ref 1.7–7.7)
Neutrophils Relative %: 54 %
Platelets: 342 10*3/uL (ref 150–400)
RBC: 4.69 MIL/uL (ref 3.87–5.11)
RDW: 14.1 % (ref 11.5–15.5)
WBC: 7.9 10*3/uL (ref 4.0–10.5)
nRBC: 0 % (ref 0.0–0.2)

## 2019-03-07 LAB — BASIC METABOLIC PANEL
Anion gap: 10 (ref 5–15)
BUN: 14 mg/dL (ref 6–20)
CO2: 24 mmol/L (ref 22–32)
Calcium: 9.2 mg/dL (ref 8.9–10.3)
Chloride: 102 mmol/L (ref 98–111)
Creatinine, Ser: 0.81 mg/dL (ref 0.44–1.00)
GFR calc Af Amer: >60 mL/min (ref >60)
GFR calc non Af Amer: >60 mL/min (ref >60)
Glucose, Bld: 172 mg/dL — ABNORMAL HIGH (ref 70–99)
Potassium: 4 mmol/L (ref 3.5–5.1)
Sodium: 136 mmol/L (ref 135–145)

## 2019-03-07 LAB — NOVEL CORONAVIRUS, NAA: SARS-CoV-2, NAA: NOT DETECTED

## 2019-03-07 MED ORDER — DIPHENHYDRAMINE HCL 50 MG/ML IJ SOLN
25.0000 mg | Freq: Once | INTRAMUSCULAR | Status: AC
  Administered 2019-03-07: 25 mg via INTRAVENOUS
  Filled 2019-03-07: qty 1

## 2019-03-07 MED ORDER — DEXAMETHASONE SODIUM PHOSPHATE 10 MG/ML IJ SOLN
10.0000 mg | Freq: Once | INTRAMUSCULAR | Status: AC
  Administered 2019-03-07: 10 mg via INTRAVENOUS
  Filled 2019-03-07: qty 1

## 2019-03-07 MED ORDER — IOHEXOL 350 MG/ML SOLN
100.0000 mL | Freq: Once | INTRAVENOUS | Status: AC | PRN
  Administered 2019-03-07: 80 mL via INTRAVENOUS

## 2019-03-07 MED ORDER — METOCLOPRAMIDE HCL 5 MG/ML IJ SOLN
10.0000 mg | Freq: Once | INTRAMUSCULAR | Status: AC
  Administered 2019-03-07: 17:00:00 10 mg via INTRAVENOUS
  Filled 2019-03-07: qty 2

## 2019-03-07 NOTE — ED Provider Notes (Addendum)
[facility] EMERGENCY DEPARTMENT Provider Note   CSN: [id number] Arrival date & time: 03/07/19  1605     History   Chief Complaint Chief Complaint  Patient presents with  . Headache    HPI Grace Gregory is a 58 y.o. female.     Patient is a 58 year old female who presents with a headache.  She states it started about 10 days ago.  She woke up 1 morning and she was lifting her head off the bed notices severe pain to the back of her head.  It radiates around to her forehead and around her eyes.  She says she has been treating it for the last 10 days with over-the-counter medicines.  She also saw her PCP was concerned about a sinus headache.  She does have seasonal allergies.  She was given a 5-day course of prednisone which she took and also 800 mg Motrin.  None of these have relieved her symptoms.  Her posterior headache has markedly improved but she still has a headache around her eyes and her frontal sinuses.  She has tried some sinus washes which have helped a little bit.  She denies any associated neck pain.  No dizziness.  No nausea or vomiting.  She does have some mild photophobia.  No known fevers.  She did have a negative Covid test which resulted today.  She has had a history of some prior sinus headaches but none this significant.     Past Medical History:  Diagnosis Date  . Arthritis   . Arthritis   . GERD (gastroesophageal reflux disease)   . Hypertension   . Seasonal allergies   . Sleep apnea     Patient Active Problem List   Diagnosis Date Noted  . Sinus headache 02/27/2019  . Inclusion cyst 01/28/2019  . Need for influenza vaccination 01/28/2019  . Type 2 diabetes mellitus with hyperglycemia, without long-term current use of insulin ([place]) 12/31/2018  . Healthcare maintenance 12/31/2018  . Urinary frequency 12/31/2018  . 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 Strain of lumbar region 02/04/2018  . Glossitis 10/01/2017   . Non-seasonal allergic rhinitis 06/27/2017  . Bulging of lumbar intervertebral disc 06/27/2017  . Reactive airway disease 06/27/2017  . Gastroesophageal reflux disease 06/27/2017  . Obstructive sleep apnea syndrome 06/27/2017  . Non-seasonal allergic rhinitis due to pollen 06/27/2017  . Essential hypertension 06/27/2017    Past Surgical History:  Procedure Laterality Date  . ABDOMINAL HYSTERECTOMY    . BACK SURGERY    . VULVECTOMY N/A 08/06/2018   Procedure: WIDE LOCAL EXCISION VULVA;  Surgeon: Everitt Amber, MD;  Location: [facility];  Service: Gynecology;  Laterality: N/A;     OB History    Gravida  1   Para      Term      Preterm      AB  1   Living        SAB  1   TAB      Ectopic      Multiple      Live Births               Home Medications    Prior to Admission medications   Medication Sig Start Date End Date Taking? Authorizing Provider  azelastine (ASTELIN) 0.1 % nasal spray Place into both nostrils 2 (two) times daily. Use in each nostril as directed    [provider]  Blood Glucose Monitoring Suppl (FREESTYLE  LITE) DEVI Use to test blood sugars 1-2 times daily. 01/29/19   Libby Maw, MD  budesonide-formoterol [facility]) 160-4.5 MCG/ACT inhaler Inhale 2 puffs into the lungs 2 (two) times daily.    [provider]  cetirizine (ZYRTEC) 10 MG tablet Take 10 mg by mouth daily.    [provider]  clobetasol cream (TEMOVATE) [id number] % Apply 1 application topically 2 (two) times daily. 08/21/18   Everitt Amber, MD  estradiol (DOTTI) 0.05 MG/24HR patch Place 1 patch (0.05 mg total) onto the skin 2 (two) times a week. 09/26/18   Lavonia Drafts, MD  fluconazole (DIFLUCAN) 150 MG tablet Take one daily for 7 days. 12/30/18   Libby Maw, MD  gabapentin (NEURONTIN) 300 MG capsule TAKE 1 CAPSULE BY MOUTH TWICE DAILY 11/18/18   Libby Maw, MD  glucose blood (FREESTYLE LITE) test strip Use  to test blood sugars 1-2 times daily. 01/29/19   Libby Maw, MD  hydrochlorothiazide (HYDRODIURIL) 12.5 MG tablet TAKE 1 TABLET BY MOUTH ONCE DAILY 09/30/18   Libby Maw, MD  ibuprofen (ADVIL) 800 MG tablet Take 1 tablet (800 mg total) by mouth every 8 (eight) hours as needed. 03/05/19   Libby Maw, MD  Lancets (FREESTYLE) lancets Use to test blood sugars 1-2 times daily. 01/29/19   Libby Maw, MD  losartan (COZAAR) 50 MG tablet TAKE 1 TABLET BY MOUTH ONCE DAILY 10/15/18   Libby Maw, MD  metFORMIN (GLUCOPHAGE) 500 MG tablet Take 1 tablet (500 mg total) by mouth 2 (two) times daily with a meal. 12/31/18   Libby Maw, MD  metoprolol succinate (TOPROL-XL) 25 MG 24 hr tablet TAKE 1 TABLET BY MOUTH ONCE DAILY 01/20/19   Libby Maw, MD  mometasone (NASONEX) 50 MCG/ACT nasal spray PLACE 1 SPRAY IN EACH NOSTRIL. MAY INCREASE TO 2 SPRAYS IN EACH NOSTRIL AS TOLERATED 06/17/18   Libby Maw, MD  montelukast (SINGULAIR) 10 MG tablet Take 1 tablet (10 mg total) by mouth daily. 12/04/17   Libby Maw, MD  omeprazole (PRILOSEC) 40 MG capsule TAKE 1 CAPSULE (40 MG TOTAL) BY MOUTH DAILY. 07/18/18   Libby Maw, MD  PROAIR HFA 108 [phone] Base) MCG/ACT inhaler Use as directed 06/15/17   [provider]  senna (SENOKOT) 8.6 MG TABS tablet Take 1 tablet (8.6 mg total) by mouth at bedtime. 08/06/18   Everitt Amber, MD    Family History Family History  Problem Relation Age of Onset  . Hypertension Mother   . Heart disease Father   . Hypertension Sister   . Hypertension Brother   . Hypertension Sister   . Hypertension Sister   . Cancer Brother   . Hypertension Brother     Social History Social History   Tobacco Use  . Smoking status: Never Smoker  . Smokeless tobacco: Never Used  Substance Use Topics  . Alcohol use: Never    Frequency: Never  . Drug use: Never     Allergies   Lisinopril and  Penicillins   Review of Systems Review of Systems  Constitutional: Negative for chills, diaphoresis, fatigue and fever.  HENT: Negative for congestion, rhinorrhea and sneezing.   Eyes: Positive for photophobia.  Respiratory: Negative for cough, chest tightness and shortness of breath.   Cardiovascular: Negative for chest pain and leg swelling.  Gastrointestinal: Negative for abdominal pain, blood in stool, diarrhea, nausea and vomiting.  Genitourinary: Negative for difficulty urinating, flank pain, frequency and hematuria.  Musculoskeletal: Negative for arthralgias and back pain.  Skin: Negative for rash.  Neurological: Positive for headaches. Negative for dizziness, speech difficulty, weakness and numbness.     Physical Exam Updated Vital Signs BP 133/87   Pulse 85   Temp 98.1 F (36.7 C) (Oral)   Resp 14   Ht 5\' 2"  (1.575 m)   Wt 108 kg   LMP  (LMP Unknown)   SpO2 99%   BMI 43.53 kg/m   Physical Exam Constitutional:      Appearance: She is well-developed.  HENT:     Head: Normocephalic and atraumatic.  Eyes:     Extraocular Movements: Extraocular movements intact.     Pupils: Pupils are equal, round, and reactive to light.     Comments: Visual fundi not well-visualized  Neck:     Musculoskeletal: Normal range of motion and neck supple.  Cardiovascular:     Rate and Rhythm: Normal rate and regular rhythm.     Heart sounds: Normal heart sounds.  Pulmonary:     Effort: Pulmonary effort is normal. No respiratory distress.     Breath sounds: Normal breath sounds. No wheezing or rales.  Chest:     Chest wall: No tenderness.  Abdominal:     General: Bowel sounds are normal.     Palpations: Abdomen is soft.     Tenderness: There is no abdominal tenderness. There is no guarding or rebound.  Musculoskeletal: Normal range of motion.  Lymphadenopathy:     Cervical: No cervical adenopathy.  Skin:    General: Skin is warm and dry.     Findings: No rash.  Neurological:      Mental Status: She is alert and oriented to person, place, and time.     Comments: Motor 5/5 all extremities Sensation grossly intact to LT all extremities Finger to Nose intact, no pronator drift CN II-XII grossly intact Gait normal       ED Treatments / Results  Labs (all labs ordered are listed, but only abnormal results are displayed) Labs Reviewed  BASIC METABOLIC PANEL - Abnormal; Notable for the following components:      Result Value   Glucose, Bld 172 (*)    All other components within normal limits  CBC WITH DIFFERENTIAL/PLATELET    EKG None  Radiology Ct Angio Head W/cm &/or Wo Cm  Result Date: 03/07/2019 CLINICAL DATA:  Severe acute headache. Worst headache of life. Duration of symptoms 10 days. EXAM: CT ANGIOGRAPHY HEAD TECHNIQUE: Multidetector CT imaging of the head was performed using the standard protocol during bolus administration of intravenous contrast. Multiplanar CT image reconstructions and MIPs were obtained to evaluate the vascular anatomy. CONTRAST:  58mL OMNIPAQUE IOHEXOL 350 MG/ML SOLN COMPARISON:  None. FINDINGS: CT HEAD Brain: No accelerated generalized atrophy. Widespread patchy low-density within the cerebral hemispheric deep and subcortical white matter most consistent with chronic small vessel disease, in a person with a history of hypertension. No large vessel territory infarction. No mass lesion, hemorrhage, hydrocephalus or extra-axial collection Vascular: No acute vascular finding. Skull: Normal Sinuses: Clear Orbits: Normal CTA HEAD Anterior circulation: Both internal carotid arteries are patent through the skull base and siphon regions. Ordinary atherosclerotic calcification in the carotid siphons but without stenosis greater than 30%. The anterior and middle cerebral vessels are patent without proximal stenosis, aneurysm or vascular malformation. Distal vessels do show some atherosclerotic irregularity. Posterior circulation: Both vertebral  arteries are patent to the basilar. No basilar stenosis. Posterior circulation branch vessels are patent.  Distal vessel atherosclerotic irregularity particularly in the PCA branches. Venous sinuses: Patent and normal. Anatomic variants: None significant. IMPRESSION: 1. No acute finding by CT. Extensive chronic small vessel ischemic changes throughout the cerebral hemispheric white matter, in a person with a history of hypertension. 2. No intracranial large or medium vessel occlusion or correctable proximal stenosis. Distal vessel atherosclerotic irregularity particularly in the PCA branches. Electronically Signed   By: Nelson Chimes M.D.   On: 03/07/2019 18:07    Procedures Procedures (including critical care time)  Medications Ordered in ED Medications  dexamethasone (DECADRON) injection 10 mg (10 mg Intravenous Given 03/07/19 1650)  diphenhydrAMINE (BENADRYL) injection 25 mg (25 mg Intravenous Given 03/07/19 1646)  metoCLOPramide (REGLAN) injection 10 mg (10 mg Intravenous Given 03/07/19 1648)  iohexol (OMNIPAQUE) 350 MG/ML injection 100 mL (80 mLs Intravenous Contrast Given 03/07/19 1740)     Initial Impression / Assessment and Plan / ED Course  I have reviewed the triage vital signs and the nursing notes.  Pertinent labs & imaging results that were available during my care of the patient were reviewed by me and considered in my medical decision making (see chart for details).        Patient is a 58 year old female who presents with a headache that has been going on for 10 days.  She is neurologically intact.  No meningismus.  Given the posterior nature of her headache, CT and CTA was performed.  There is no evidence of aneurysm.  No evidence of bleeding or mass.  Her labs are nonconcerning.  She does not have any meningeal signs.  She is got no dizziness or ataxia.  She is feeling much better after migraine cocktail and is ready to go home.  I did advise her that she has some mild  atherosclerotic changes in her arteries and some changes consistent with her high blood pressure.  She was encouraged to follow-up with her PCP if her symptoms are not improving or return here as needed if she has any worsening symptoms.  Final Clinical Impressions(s) / ED Diagnoses   Final diagnoses:  Bad headache    ED Discharge Orders    None       Malvin Johns, MD 03/07/19 Levin Bacon, MD 03/07/19 [phone]

## 2019-03-07 NOTE — ED Triage Notes (Signed)
Headache x 10 days. She was seen by her MD and started on Prednisone and Motrin 8 days ago with no relief.

## 2019-03-11 DIAGNOSIS — J3089 Other allergic rhinitis: Secondary | ICD-10-CM | POA: Diagnosis not present

## 2019-03-11 DIAGNOSIS — J301 Allergic rhinitis due to pollen: Secondary | ICD-10-CM | POA: Diagnosis not present

## 2019-03-13 MED FILL — DOTTI 0.05 MG/24HR PTTW: 0.05 | 28 days supply | Qty: 8 | Fill #6

## 2019-03-20 DIAGNOSIS — J301 Allergic rhinitis due to pollen: Secondary | ICD-10-CM | POA: Diagnosis not present

## 2019-03-20 DIAGNOSIS — J3089 Other allergic rhinitis: Secondary | ICD-10-CM | POA: Diagnosis not present

## 2019-03-26 DIAGNOSIS — J3089 Other allergic rhinitis: Secondary | ICD-10-CM | POA: Diagnosis not present

## 2019-03-26 DIAGNOSIS — J301 Allergic rhinitis due to pollen: Secondary | ICD-10-CM | POA: Diagnosis not present

## 2019-03-31 ENCOUNTER — Encounter: Payer: Self-pay | Admitting: Family Medicine

## 2019-03-31 ENCOUNTER — Other Ambulatory Visit: Payer: Self-pay | Admitting: Family Medicine

## 2019-03-31 DIAGNOSIS — E1165 Type 2 diabetes mellitus with hyperglycemia: Secondary | ICD-10-CM

## 2019-03-31 MED FILL — metFORMIN HCL 500 MG TABS: 500 | 90 days supply | Qty: 180 | Fill #0

## 2019-03-31 MED FILL — GABAPENTIN 300 MG CAPSULE: 300 | 30 days supply | Qty: 60 | Fill #0

## 2019-04-01 DIAGNOSIS — J301 Allergic rhinitis due to pollen: Secondary | ICD-10-CM | POA: Diagnosis not present

## 2019-04-01 DIAGNOSIS — J3089 Other allergic rhinitis: Secondary | ICD-10-CM | POA: Diagnosis not present

## 2019-04-01 MED FILL — MOMETASONE FUROATE 50 MCG S: 50 | 30 days supply | Qty: 17 | Fill #2

## 2019-04-01 MED FILL — ADVAIR 250/50 DISKUS: 250-50 | 30 days supply | Qty: 60 | Fill #0

## 2019-04-08 DIAGNOSIS — J301 Allergic rhinitis due to pollen: Secondary | ICD-10-CM | POA: Diagnosis not present

## 2019-04-08 DIAGNOSIS — J3089 Other allergic rhinitis: Secondary | ICD-10-CM | POA: Diagnosis not present

## 2019-04-09 ENCOUNTER — Other Ambulatory Visit: Payer: Self-pay | Admitting: Family Medicine

## 2019-04-09 ENCOUNTER — Encounter: Payer: Self-pay | Admitting: Family Medicine

## 2019-04-09 MED ORDER — MONTELUKAST SODIUM 10 MG PO TABS: 10.0000 mg | ORAL_TABLET | Freq: Every day | ORAL | 2 refills | Status: DC

## 2019-04-09 MED FILL — HYDROCHLOROTHIAZIDE 12.5 MG: 12.5 | 90 days supply | Qty: 90 | Fill #0

## 2019-04-09 MED FILL — MONTELUKAST SOD 10 MG TAB: 10 | 90 days supply | Qty: 90 | Fill #0

## 2019-04-09 MED FILL — DOTTI 0.05 MG/24HR PTTW: 0.05 | 28 days supply | Qty: 8 | Fill #7

## 2019-04-11 DIAGNOSIS — J301 Allergic rhinitis due to pollen: Secondary | ICD-10-CM | POA: Diagnosis not present

## 2019-04-14 ENCOUNTER — Other Ambulatory Visit: Payer: Self-pay | Admitting: Gynecologic Oncology

## 2019-04-14 DIAGNOSIS — L409 Psoriasis, unspecified: Secondary | ICD-10-CM

## 2019-04-14 DIAGNOSIS — J3089 Other allergic rhinitis: Secondary | ICD-10-CM | POA: Diagnosis not present

## 2019-04-15 DIAGNOSIS — J301 Allergic rhinitis due to pollen: Secondary | ICD-10-CM | POA: Diagnosis not present

## 2019-04-15 DIAGNOSIS — J3089 Other allergic rhinitis: Secondary | ICD-10-CM | POA: Diagnosis not present

## 2019-04-15 MED FILL — CLOBETASOL PROPIONATE 0.05: 0.05 | 14 days supply | Qty: 30 | Fill #0

## 2019-04-20 ENCOUNTER — Encounter: Payer: Self-pay | Admitting: Family Medicine

## 2019-04-21 ENCOUNTER — Other Ambulatory Visit: Payer: Self-pay | Admitting: Family Medicine

## 2019-04-21 MED FILL — OMEPRAZOLE 40 MG CPDR: 40 | 90 days supply | Qty: 90 | Fill #0

## 2019-04-21 MED FILL — METOPROLOL SUCCINATE ER 25: 25 | 90 days supply | Qty: 90 | Fill #1

## 2019-04-21 MED FILL — LOSARTAN POTASSIUM 50 MG TA: 50 | 90 days supply | Qty: 90 | Fill #0

## 2019-04-22 DIAGNOSIS — J3089 Other allergic rhinitis: Secondary | ICD-10-CM | POA: Diagnosis not present

## 2019-04-22 DIAGNOSIS — J301 Allergic rhinitis due to pollen: Secondary | ICD-10-CM | POA: Diagnosis not present

## 2019-04-28 MED FILL — ALBUTEROL SULFATE HFA 108 (: 108 (90 BAS | 17 days supply | Qty: 9 | Fill #0

## 2019-04-28 MED FILL — GABAPENTIN 300 MG CAPSULE: 300 | 30 days supply | Qty: 60 | Fill #1

## 2019-04-29 DIAGNOSIS — J301 Allergic rhinitis due to pollen: Secondary | ICD-10-CM | POA: Diagnosis not present

## 2019-04-29 DIAGNOSIS — J3089 Other allergic rhinitis: Secondary | ICD-10-CM | POA: Diagnosis not present

## 2019-05-06 DIAGNOSIS — J3089 Other allergic rhinitis: Secondary | ICD-10-CM | POA: Diagnosis not present

## 2019-05-06 DIAGNOSIS — J301 Allergic rhinitis due to pollen: Secondary | ICD-10-CM | POA: Diagnosis not present

## 2019-05-07 MED FILL — DOTTI 0.05 MG/24HR PTTW: 0.05 | 28 days supply | Qty: 8 | Fill #8

## 2019-05-13 DIAGNOSIS — J301 Allergic rhinitis due to pollen: Secondary | ICD-10-CM | POA: Diagnosis not present

## 2019-05-13 DIAGNOSIS — J3089 Other allergic rhinitis: Secondary | ICD-10-CM | POA: Diagnosis not present

## 2019-05-13 MED FILL — SYMBICORT 160-4.5 MCG INH: 160-4.5 | 30 days supply | Qty: 10 | Fill #0

## 2019-05-20 DIAGNOSIS — J3089 Other allergic rhinitis: Secondary | ICD-10-CM | POA: Diagnosis not present

## 2019-05-20 DIAGNOSIS — J301 Allergic rhinitis due to pollen: Secondary | ICD-10-CM | POA: Diagnosis not present

## 2019-05-22 DIAGNOSIS — J3089 Other allergic rhinitis: Secondary | ICD-10-CM | POA: Diagnosis not present

## 2019-05-22 DIAGNOSIS — J301 Allergic rhinitis due to pollen: Secondary | ICD-10-CM | POA: Diagnosis not present

## 2019-05-28 DIAGNOSIS — J3089 Other allergic rhinitis: Secondary | ICD-10-CM | POA: Diagnosis not present

## 2019-05-28 DIAGNOSIS — J301 Allergic rhinitis due to pollen: Secondary | ICD-10-CM | POA: Diagnosis not present

## 2019-06-02 DIAGNOSIS — J3089 Other allergic rhinitis: Secondary | ICD-10-CM | POA: Diagnosis not present

## 2019-06-02 DIAGNOSIS — J301 Allergic rhinitis due to pollen: Secondary | ICD-10-CM | POA: Diagnosis not present

## 2019-06-02 MED FILL — DOTTI 0.05 MG/24HR PTTW: 0.05 | 28 days supply | Qty: 8 | Fill #9

## 2019-06-02 MED FILL — GABAPENTIN 300 MG CAPSULE: 300 | 30 days supply | Qty: 60 | Fill #2

## 2019-06-11 DIAGNOSIS — J3089 Other allergic rhinitis: Secondary | ICD-10-CM | POA: Diagnosis not present

## 2019-06-11 DIAGNOSIS — J301 Allergic rhinitis due to pollen: Secondary | ICD-10-CM | POA: Diagnosis not present

## 2019-06-18 DIAGNOSIS — J3089 Other allergic rhinitis: Secondary | ICD-10-CM | POA: Diagnosis not present

## 2019-06-18 DIAGNOSIS — J301 Allergic rhinitis due to pollen: Secondary | ICD-10-CM | POA: Diagnosis not present

## 2019-06-23 MED FILL — SYMBICORT 160-4.5 MCG INH: 160-4.5 | 30 days supply | Qty: 10 | Fill #1

## 2019-06-24 DIAGNOSIS — J301 Allergic rhinitis due to pollen: Secondary | ICD-10-CM | POA: Diagnosis not present

## 2019-06-24 DIAGNOSIS — J3089 Other allergic rhinitis: Secondary | ICD-10-CM | POA: Diagnosis not present

## 2019-07-01 DIAGNOSIS — J3089 Other allergic rhinitis: Secondary | ICD-10-CM | POA: Diagnosis not present

## 2019-07-01 DIAGNOSIS — J301 Allergic rhinitis due to pollen: Secondary | ICD-10-CM | POA: Diagnosis not present

## 2019-07-02 MED FILL — DOTTI 0.05 MG/24HR PTTW: 0.05 | 28 days supply | Qty: 8 | Fill #10

## 2019-07-02 MED FILL — GABAPENTIN 300 MG CAPSULE: 300 | 30 days supply | Qty: 60 | Fill #3

## 2019-07-09 DIAGNOSIS — J3089 Other allergic rhinitis: Secondary | ICD-10-CM | POA: Diagnosis not present

## 2019-07-09 DIAGNOSIS — J301 Allergic rhinitis due to pollen: Secondary | ICD-10-CM | POA: Diagnosis not present

## 2019-07-10 MED FILL — HYDROCHLOROTHIAZIDE 12.5 MG: 12.5 | 90 days supply | Qty: 90 | Fill #1

## 2019-07-10 MED FILL — MONTELUKAST SOD 10 MG TAB: 10 | 90 days supply | Qty: 90 | Fill #0

## 2019-07-10 MED FILL — metFORMIN HCL 500 MG TABS: 500 | 90 days supply | Qty: 180 | Fill #1

## 2019-07-15 DIAGNOSIS — J3089 Other allergic rhinitis: Secondary | ICD-10-CM | POA: Diagnosis not present

## 2019-07-15 DIAGNOSIS — J301 Allergic rhinitis due to pollen: Secondary | ICD-10-CM | POA: Diagnosis not present

## 2019-07-22 DIAGNOSIS — J301 Allergic rhinitis due to pollen: Secondary | ICD-10-CM | POA: Diagnosis not present

## 2019-07-22 DIAGNOSIS — J3089 Other allergic rhinitis: Secondary | ICD-10-CM | POA: Diagnosis not present

## 2019-07-23 ENCOUNTER — Encounter: Payer: Self-pay | Admitting: Family Medicine

## 2019-07-23 ENCOUNTER — Other Ambulatory Visit: Payer: Self-pay | Admitting: Family Medicine

## 2019-07-23 ENCOUNTER — Telehealth: Payer: Self-pay

## 2019-07-23 MED FILL — LOSARTAN POTASSIUM 50 MG TA: 50 | 90 days supply | Qty: 90 | Fill #1

## 2019-07-23 MED FILL — OMEPRAZOLE 40 MG CPDR: 40 | 90 days supply | Qty: 90 | Fill #1

## 2019-07-23 MED FILL — METOPROLOL SUCCINATE ER 25: 25 | 90 days supply | Qty: 90 | Fill #0

## 2019-07-23 NOTE — Telephone Encounter (Signed)
Patient aware Rx sent in  

## 2019-07-23 NOTE — Telephone Encounter (Signed)
Patient calling to see if you would send in an order for CPAP supplies for patient and her husband Rosana Denhart. Please advise.

## 2019-07-24 NOTE — Telephone Encounter (Signed)
Sleep doc will do that for him.

## 2019-07-25 NOTE — Telephone Encounter (Signed)
Patient aware.

## 2019-07-29 ENCOUNTER — Other Ambulatory Visit: Payer: Self-pay | Admitting: Family Medicine

## 2019-07-29 DIAGNOSIS — J3089 Other allergic rhinitis: Secondary | ICD-10-CM | POA: Diagnosis not present

## 2019-07-29 DIAGNOSIS — J301 Allergic rhinitis due to pollen: Secondary | ICD-10-CM | POA: Diagnosis not present

## 2019-07-29 MED FILL — DOTTI 0.05 MG/24HR PTTW: 0.05 | 28 days supply | Qty: 8 | Fill #11

## 2019-07-29 MED FILL — GABAPENTIN 300 MG CAPSULE: 300 | 30 days supply | Qty: 60 | Fill #0

## 2019-07-31 ENCOUNTER — Other Ambulatory Visit: Payer: Self-pay

## 2019-07-31 ENCOUNTER — Ambulatory Visit: Payer: 59 | Admitting: Family Medicine

## 2019-07-31 ENCOUNTER — Encounter: Payer: Self-pay | Admitting: Family Medicine

## 2019-07-31 VITALS — BP 130/82 | HR 74 | Temp 97.6°F | Ht 62.0 in | Wt 233.6 lb

## 2019-07-31 DIAGNOSIS — R079 Chest pain, unspecified: Secondary | ICD-10-CM

## 2019-07-31 NOTE — Progress Notes (Signed)
Grace Gregory is a 59 y.o. female  Chief Complaint  Patient presents with  . Chest Pain    Pt said that she thinks she has pulled a muscle, pt said it is in middle of chest area and moves around.  Pain, dull achy, off and one, x 2wweks  Pt has been recently working out. Pt will recieve her 2nd covid injection tomorrow.    HPI: Aigner Stasiak is a 59 y.o. female who complains of dull, achy discomfort in the center and left side of her chest x 2 weeks. Symptoms intermittent off/on all day.  She has taken tylenol and possibly motrin, used heating pad x last 2 AM. She has been trying to work out including upper body weights. 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 No n/v. No diaphoresis. No diarrhea or constipation. Normal appetite.  Pt walks 30 min near her house for exercise - no issue. BP at home this AM 123/74 and HR 74. Fasting BS this Am 114. Pt is a non-smoker. She had GERD, on omeprazole, well-controlled. Very few breakthrough symptoms.   Past Medical History:  Diagnosis Date  . Arthritis   . Arthritis   . GERD (gastroesophageal reflux disease)   . Hypertension   . Seasonal allergies   . Sleep apnea     Past Surgical History:  Procedure Laterality Date  . ABDOMINAL HYSTERECTOMY    . BACK SURGERY    . VULVECTOMY N/A 08/06/2018   Procedure: WIDE LOCAL EXCISION VULVA;  Surgeon: Everitt Amber, MD;  Location: [facility];  Service: Gynecology;  Laterality: N/A;    Social History   Socioeconomic History  . Marital status: Married    Spouse name: Not on file  . Number of children: Not on file  . Years of education: Not on file  . Highest education level: Not on file  Occupational History  . Not on file  Tobacco Use  . Smoking status: Never Smoker  . Smokeless tobacco: Never Used  Substance and Sexual Activity  . Alcohol use: Never  . Drug use: Never  . Sexual activity: Yes    Birth  control/protection: None  Other Topics Concern  . Not on file  Social History Narrative  . Not on file   Social Determinants of Health   Financial Resource Strain:   . Difficulty of Paying Living Expenses:   Food Insecurity:   . Worried About Charity fundraiser in the Last Year:   . Arboriculturist in the Last Year:   Transportation Needs:   . Film/video editor (Medical):   Marland Kitchen Lack of Transportation (Non-Medical):   Physical Activity:   . Days of Exercise per Week:   . Minutes of Exercise per Session:   Stress:   . Feeling of Stress :   Social Connections:   . Frequency of Communication with Friends and Family:   . Frequency of Social Gatherings with Friends and Family:   . Attends Religious Services:   . Active Member of Clubs or Organizations:   . Attends Archivist Meetings:   Marland Kitchen Marital Status:   Intimate Partner Violence:   . Fear of Current or Ex-Partner:   . Emotionally Abused:   Marland Kitchen Physically Abused:   . Sexually Abused:     Family History  Problem Relation Age of Onset  . Hypertension Mother   . Heart disease Father   . Hypertension Sister   .  Hypertension Brother   . Hypertension Sister   . Hypertension Sister   . Cancer Brother   . Hypertension Brother      Immunization History  Administered Date(s) Administered  . Influenza,inj,Quad PF,6+ Mos 02/08/2018, 01/28/2019  . Influenza-Unspecified 02/16/2017  . PFIZER SARS-COV-2 Vaccination 07/11/2019  . Tdap 12/20/2009    Outpatient Encounter Medications as of 07/31/2019  Medication Sig  . azelastine (ASTELIN) 0.1 % nasal spray Place into both nostrils 2 (two) times daily. Use in each nostril as directed  . Blood Glucose Monitoring Suppl (FREESTYLE LITE) DEVI Use to test blood sugars 1-2 times daily.  . budesonide-formoterol (SYMBICORT) 160-4.5 MCG/ACT inhaler Inhale 2 puffs into the lungs 2 (two) times daily.  . cetirizine (ZYRTEC) 10 MG tablet Take 10 mg by mouth daily.  . clobetasol  cream (TEMOVATE) [id number] % APPLY 1 APPLICATION TOPICALLY 2 (TWO) TIMES DAILY.  Marland Kitchen estradiol (DOTTI) 0.05 MG/24HR patch Place 1 patch (0.05 mg total) onto the skin 2 (two) times a week.  . gabapentin (NEURONTIN) 300 MG capsule TAKE 1 CAPSULE BY MOUTH TWICE DAILY  . glucose blood (FREESTYLE LITE) test strip Use to test blood sugars 1-2 times daily.  . hydrochlorothiazide (HYDRODIURIL) 12.5 MG tablet TAKE 1 TABLET BY MOUTH ONCE DAILY  . ibuprofen (ADVIL) 800 MG tablet Take 1 tablet (800 mg total) by mouth every 8 (eight) hours as needed.  Marland Kitchen losartan (COZAAR) 50 MG tablet TAKE 1 TABLET BY MOUTH ONCE DAILY  . metFORMIN (GLUCOPHAGE) 500 MG tablet TAKE 1 TABLET (500 MG TOTAL) BY MOUTH 2 (TWO) TIMES DAILY WITH A MEAL.  . metoprolol succinate (TOPROL-XL) 25 MG 24 hr tablet TAKE 1 TABLET BY MOUTH ONCE DAILY  . mometasone (NASONEX) 50 MCG/ACT nasal spray PLACE 1 SPRAY IN EACH NOSTRIL. MAY INCREASE TO 2 SPRAYS IN EACH NOSTRIL AS TOLERATED  . montelukast (SINGULAIR) 10 MG tablet Take 1 tablet (10 mg total) by mouth daily.  Marland Kitchen omeprazole (PRILOSEC) 40 MG capsule TAKE 1 CAPSULE (40 MG TOTAL) BY MOUTH DAILY.  Marland Kitchen PROAIR HFA 108 (90 Base) MCG/ACT inhaler Use as directed  . Lancets (FREESTYLE) lancets Use to test blood sugars 1-2 times daily.  . [DISCONTINUED] fluconazole (DIFLUCAN) 150 MG tablet Take one daily for 7 days.  . [DISCONTINUED] senna (SENOKOT) 8.6 MG TABS tablet Take 1 tablet (8.6 mg total) by mouth at bedtime. (Patient not taking: Reported on 07/31/2019)   No facility-administered encounter medications on file as of 07/31/2019.     ROS: Pertinent positives and negatives noted in HPI. Remainder of ROS non-contributory  Allergies  Allergen Reactions  . Lisinopril Cough  . Penicillins Rash    BP 130/82   Pulse 74   Temp 97.6 F (36.4 C) (Temporal)   Ht 5\' 2"  (1.575 m)   Wt 233 lb 9.6 oz (106 kg)   LMP  (LMP Unknown)   SpO2 98%   BMI 42.73 kg/m  BP Readings from Last 3 Encounters:  07/31/19  130/82  03/07/19 132/76  02/27/19 128/80    Physical Exam  Constitutional: She is oriented to person, place, and time. She appears well-developed and well-nourished. No distress.  Cardiovascular: Normal rate, regular rhythm, normal heart sounds and intact distal pulses.  Pulmonary/Chest: Effort normal and breath sounds normal. No respiratory distress. She exhibits tenderness.    Neurological: She is alert and oriented to person, place, and time.  Skin: Skin is warm and dry.  Psychiatric: Her behavior is normal. Her mood appears anxious.     A/P:  1. Chest pain, unspecified type - does not sound like cardiac in nature but pt does have fam h/o HTN, CV disease. She is a newly diagnosed diabetic, had HTN, sleep apnea. VS WNL. Will get EKG - EKG 12-Lead - seems more MSK in nature, could be reflux-related as well - heating pad BID, ibuprofen 600mg  BID w/ food x 5-7 days - f/u if symptoms do not improve in 1-2 wks or sooner if symptoms worsen or new ones develop Discussed plan and reviewed medications with patient, including risks, benefits, and potential side effects. Pt expressed understand. All questions answered.   I personally spent 35 min with the patient today and greater than 50% was spent in counseling, coordination of care, education  This visit occurred during the SARS-CoV-2 public health emergency.  Safety protocols were in place, including screening questions prior to the visit, additional usage of staff PPE, and extensive cleaning of exam room while observing appropriate contact time as indicated for disinfecting solutions.

## 2019-08-01 ENCOUNTER — Telehealth: Payer: Self-pay | Admitting: Behavioral Health

## 2019-08-01 NOTE — Telephone Encounter (Signed)
Called and spoke with patient regarding concerns about her office visit on 07/31/19. Patient expressed that the visit had taken much longer than she expected. She was at the office close to 2.5 hours. Patient voiced that the CMA was excellent and the provider was very nice, friendly and had established a good report, but once she had saw her and arranged for an EKG to be completed, she never returned. Patient stated, "this is very unprofessional and I'm very disappointed in my service". She also voiced that at 2:45 PM, she could hear that it was reported to the provider that EKG is ready, then 3:00 PM comes and 3:15 PM and she's still waiting and no one comes to the room; it was not until 3:45 PM that the [facility] comes in to clean the room and notices that she was still there. Patient verbalized that the CMA was very apologetic and informed her that per the provider, "everything is ok, you're good to go". However, she felt that this information could have been given to her a lot sooner because she overheard the CMA communicate to the provider that the EKG had been completed prior to the provider moving on to see virtual appointments. So, she was not sure why it took so long for someone to return once it had been done.  RN informed patient that she would speak with both the PCP and CMA involved to address the concern and barriers. Also, the practice administrator will be made aware of this matter as well. RN apologized to the patient. Patient thanked Therapist, sports for listening and speaking with her about yesterday's experience. She voiced that a follow-up call isn't necessary at this time, just want to make sure this doesn't happen to any other patients.

## 2019-08-06 DIAGNOSIS — J3089 Other allergic rhinitis: Secondary | ICD-10-CM | POA: Diagnosis not present

## 2019-08-06 DIAGNOSIS — J301 Allergic rhinitis due to pollen: Secondary | ICD-10-CM | POA: Diagnosis not present

## 2019-08-12 DIAGNOSIS — J3089 Other allergic rhinitis: Secondary | ICD-10-CM | POA: Diagnosis not present

## 2019-08-12 DIAGNOSIS — J301 Allergic rhinitis due to pollen: Secondary | ICD-10-CM | POA: Diagnosis not present

## 2019-08-19 DIAGNOSIS — J3089 Other allergic rhinitis: Secondary | ICD-10-CM | POA: Diagnosis not present

## 2019-08-19 DIAGNOSIS — J301 Allergic rhinitis due to pollen: Secondary | ICD-10-CM | POA: Diagnosis not present

## 2019-08-19 DIAGNOSIS — J3081 Allergic rhinitis due to animal (cat) (dog) hair and dander: Secondary | ICD-10-CM | POA: Diagnosis not present

## 2019-08-21 MED FILL — DOTTI 0.05 MG/24HR PTTW: 0.05 | 28 days supply | Qty: 8 | Fill #12

## 2019-08-21 MED FILL — SYMBICORT 160-4.5 MCG INH: 160-4.5 | 30 days supply | Qty: 10 | Fill #2

## 2019-08-28 DIAGNOSIS — J3089 Other allergic rhinitis: Secondary | ICD-10-CM | POA: Diagnosis not present

## 2019-08-28 DIAGNOSIS — J301 Allergic rhinitis due to pollen: Secondary | ICD-10-CM | POA: Diagnosis not present

## 2019-09-01 MED FILL — GABAPENTIN 300 MG CAPSULE: 300 | 30 days supply | Qty: 60 | Fill #1

## 2019-09-04 DIAGNOSIS — J3089 Other allergic rhinitis: Secondary | ICD-10-CM | POA: Diagnosis not present

## 2019-09-04 DIAGNOSIS — J301 Allergic rhinitis due to pollen: Secondary | ICD-10-CM | POA: Diagnosis not present

## 2019-09-09 DIAGNOSIS — J3089 Other allergic rhinitis: Secondary | ICD-10-CM | POA: Diagnosis not present

## 2019-09-09 DIAGNOSIS — J301 Allergic rhinitis due to pollen: Secondary | ICD-10-CM | POA: Diagnosis not present

## 2019-09-15 DIAGNOSIS — J3089 Other allergic rhinitis: Secondary | ICD-10-CM | POA: Diagnosis not present

## 2019-09-15 DIAGNOSIS — J301 Allergic rhinitis due to pollen: Secondary | ICD-10-CM | POA: Diagnosis not present

## 2019-09-19 DIAGNOSIS — J301 Allergic rhinitis due to pollen: Secondary | ICD-10-CM | POA: Diagnosis not present

## 2019-09-19 DIAGNOSIS — J3089 Other allergic rhinitis: Secondary | ICD-10-CM | POA: Diagnosis not present

## 2019-09-23 ENCOUNTER — Other Ambulatory Visit: Payer: Self-pay | Admitting: Family Medicine

## 2019-09-23 ENCOUNTER — Other Ambulatory Visit: Payer: Self-pay

## 2019-09-23 DIAGNOSIS — J4530 Mild persistent asthma, uncomplicated: Secondary | ICD-10-CM

## 2019-09-23 DIAGNOSIS — J3089 Other allergic rhinitis: Secondary | ICD-10-CM | POA: Diagnosis not present

## 2019-09-23 DIAGNOSIS — J301 Allergic rhinitis due to pollen: Secondary | ICD-10-CM

## 2019-09-23 MED ORDER — ESTRADIOL 0.05 MG/24HR TD PTTW: 1.0000 | MEDICATED_PATCH | TRANSDERMAL | 12 refills | Status: DC

## 2019-09-23 MED FILL — AZELASTINE HCL 137 MCG/SPRA: 137 | 25 days supply | Qty: 30 | Fill #0

## 2019-09-23 MED FILL — DOTTI 0.05 MG/24HR PTTW: 0.05 | 28 days supply | Qty: 8 | Fill #0

## 2019-09-23 MED FILL — MOMETASONE FUROATE 50 MCG S: 50 | 30 days supply | Qty: 17 | Fill #0

## 2019-09-24 ENCOUNTER — Institutional Professional Consult (permissible substitution): Payer: 59 | Admitting: Pulmonary Disease

## 2019-09-30 DIAGNOSIS — J301 Allergic rhinitis due to pollen: Secondary | ICD-10-CM | POA: Diagnosis not present

## 2019-09-30 DIAGNOSIS — J3089 Other allergic rhinitis: Secondary | ICD-10-CM | POA: Diagnosis not present

## 2019-10-03 ENCOUNTER — Ambulatory Visit: Payer: 59 | Admitting: Obstetrics & Gynecology

## 2019-10-07 DIAGNOSIS — J301 Allergic rhinitis due to pollen: Secondary | ICD-10-CM | POA: Diagnosis not present

## 2019-10-07 DIAGNOSIS — J3089 Other allergic rhinitis: Secondary | ICD-10-CM | POA: Diagnosis not present

## 2019-10-08 ENCOUNTER — Other Ambulatory Visit: Payer: Self-pay | Admitting: Family Medicine

## 2019-10-08 DIAGNOSIS — E1165 Type 2 diabetes mellitus with hyperglycemia: Secondary | ICD-10-CM

## 2019-10-09 DIAGNOSIS — J301 Allergic rhinitis due to pollen: Secondary | ICD-10-CM | POA: Diagnosis not present

## 2019-10-09 DIAGNOSIS — J3089 Other allergic rhinitis: Secondary | ICD-10-CM | POA: Diagnosis not present

## 2019-10-09 MED FILL — METFORMIN HCL 500 MG TABS: 500 | 90 days supply | Qty: 180 | Fill #0

## 2019-10-09 MED FILL — HYDROCHLOROTHIAZIDE 12.5 MG: 12.5 | 90 days supply | Qty: 90 | Fill #0

## 2019-10-13 ENCOUNTER — Other Ambulatory Visit: Payer: Self-pay | Admitting: Family Medicine

## 2019-10-13 MED FILL — OMEPRAZOLE 40 MG CPDR: 40 | 90 days supply | Qty: 90 | Fill #2

## 2019-10-13 MED FILL — LOSARTAN POTASSIUM 50 MG TA: 50 | 90 days supply | Qty: 90 | Fill #0

## 2019-10-13 MED FILL — MONTELUKAST SOD 10 MG TAB: 10 | 90 days supply | Qty: 90 | Fill #1

## 2019-10-15 ENCOUNTER — Other Ambulatory Visit: Payer: Self-pay

## 2019-10-15 ENCOUNTER — Encounter: Payer: Self-pay | Admitting: Obstetrics & Gynecology

## 2019-10-15 ENCOUNTER — Ambulatory Visit (INDEPENDENT_AMBULATORY_CARE_PROVIDER_SITE_OTHER): Payer: 59 | Admitting: Obstetrics & Gynecology

## 2019-10-15 ENCOUNTER — Encounter: Payer: Self-pay | Admitting: Internal Medicine

## 2019-10-15 VITALS — BP 123/75 | HR 95 | Ht 62.0 in | Wt 232.0 lb

## 2019-10-15 DIAGNOSIS — L819 Disorder of pigmentation, unspecified: Secondary | ICD-10-CM

## 2019-10-15 DIAGNOSIS — Z01419 Encounter for gynecological examination (general) (routine) without abnormal findings: Secondary | ICD-10-CM

## 2019-10-15 DIAGNOSIS — Z1239 Encounter for other screening for malignant neoplasm of breast: Secondary | ICD-10-CM

## 2019-10-15 DIAGNOSIS — Z1231 Encounter for screening mammogram for malignant neoplasm of breast: Secondary | ICD-10-CM | POA: Diagnosis not present

## 2019-10-15 DIAGNOSIS — J3089 Other allergic rhinitis: Secondary | ICD-10-CM | POA: Diagnosis not present

## 2019-10-15 DIAGNOSIS — J301 Allergic rhinitis due to pollen: Secondary | ICD-10-CM | POA: Diagnosis not present

## 2019-10-15 NOTE — Patient Instructions (Signed)
Vulva Biopsy  A vulva biopsy is a procedure in which a sample of tissue from the vulva is removed and examined under a microscope. The vulva is the outside part of the female genitals. The vulva includes the outside folds of skin (labia majora), the inner lips (labia minora), the clitoris, and the openings of the urethra and vagina. Your health care provider may order this procedure to diagnose a lesion, growth, rash, blister, or other tissue changes. This procedure may also be done to remove a mole or wart. Tell a health care provider about:  Any allergies you have.  All medicines you are taking, including vitamins, herbs, eye drops, creams, and over-the-counter medicines.  Any problems you or family members have had with anesthetic medicines.  Any blood disorders you have.  Any surgeries you have had.  Any medical conditions you have.  Whether you are pregnant or may be pregnant. What are the risks? Generally, this is a safe procedure. However, problems may occur, including:  Infection.  Bleeding.  Allergic reaction to medicines.  Damage to other nearby structures or organs.  Pain at the biopsy site. What happens before the procedure? Medicines Ask your health care provider about:  Changing or stopping your regular medicines. This is especially important if you are taking diabetes medicines or blood thinners.  Taking medicines such as aspirin and ibuprofen. These medicines can thin your blood. Do not take these medicines unless your health care provider tells you to take them.  Taking over-the-counter medicines, vitamins, herbs, and supplements. General instructions  Wear loose and comfortable clothing and underwear for the procedure.  Follow instructions from your health care provider about eating or drinking restrictions.  Ask your health care provider what steps will be taken to help prevent infection. These may include: ? Removing hair at the surgery  site. ? Washing skin with a germ-killing soap. ? Taking antibiotic medicine. What happens during the procedure?  You will be given a medicine to numb the area (local anesthetic).  A small sample of tissue will be removed (excised). This tissue will be sent to a lab for testing.  A medicine may be put on the biopsy site to help stop the bleeding.  The biopsy site may be closed with stitches (sutures). The procedure may vary among health care providers and hospitals. What happens after the procedure?  You may be given pain medicine.  You may be given antibiotic ointment.  It is up to you to get the results of your procedure. Ask your health care provider, or the department that is doing the procedure, when your results will be ready. Summary  A vulva biopsy is a procedure in which a sample of tissue from the vulva is removed and examined under a microscope.  Your health care provider may order this procedure to diagnose a lesion, growth, rash, blister, or other tissue changes.  Generally, this is a safe procedure. However, problems may occur, including infection, bleeding, or allergic reaction to medicines.  Ask your health care provider, or the department that is doing the procedure, when your results will be ready. This information is not intended to replace advice given to you by your health care provider. Make sure you discuss any questions you have with your health care provider. Document Revised: 11/08/2017 Document Reviewed: 11/08/2017 Elsevier Patient Education  2020 Elsevier Inc.  

## 2019-10-15 NOTE — Progress Notes (Signed)
Subjective:     Grace Gregory is a 59 y.o. female here for a routine exam. G1P0010 s/p hyst. Pt with h/o VIN III s/p biopsy. Current complaints: occ itching of the vulva. None currently. Pt reports hot flushes. No change since onset of menopause. .     Gynecologic History No LMP recorded (lmp unknown). Patient has had a hysterectomy. Contraception: post menopausal status Last Pap: s/p hsyt Last mammogram:  02/18/2019. Results were: normal  Obstetric History OB History  Gravida Para Term Preterm AB Living  1       1    SAB TAB Ectopic Multiple Live Births  1            # Outcome Date GA Lbr Len/2nd Weight Sex Delivery Anes PTL Lv  1 SAB 2001            The following portions of the patient's history were reviewed and updated as appropriate: allergies, current medications, past family history, past medical history, past social history, past surgical history and problem list.  Review of Systems Pertinent items are noted in HPI.    Objective:  BP 123/75   Pulse 95   Ht 5\' 2"  (1.575 m)   Wt 232 lb (105.2 kg)   LMP  (LMP Unknown)   BMI 42.43 kg/m  General Appearance:    Alert, cooperative, no distress, appears stated age  Head:    Normocephalic, without obvious abnormality, atraumatic  Eyes:    conjunctiva/corneas clear, EOM's intact, both eyes  Ears:    Normal external ear canals, both ears  Nose:   Nares normal, septum midline, mucosa normal, no drainage    or sinus tenderness  Throat:   Lips, mucosa, and tongue normal; teeth and gums normal  Neck:   Supple, symmetrical, trachea midline, no adenopathy;    thyroid:  no enlargement/tenderness/nodules  Back:     Symmetric, no curvature, ROM normal, no CVA tenderness  Lungs:     respirations unlabored  Chest Wall:    No tenderness or deformity   Heart:    Regular rate and rhythm  Breast Exam:    No tenderness, masses, or nipple abnormality  Abdomen:     Soft, non-tender, bowel sounds active all four quadrants,    no  masses, no organomegaly  Genitalia:    Normal female without lesion, discharge or tenderness   At the introitus there are 3 hyperpigmented areas. There are not lesions or excoriations noted. This is NOT at the site of the prev biopsy which was on the left labia.    Extremities:   Extremities normal, atraumatic, no cyanosis or edema  Pulses:   2+ and symmetric all extremities  Skin:   Skin color, texture, turgor normal, no rashes or lesions     Assessment:    Healthy female exam.   Hyperpigmentation of the vulva at the introitus of the vagina.  Pt with h/o VIN. Reviewed with pt findings and concerns and need for biopsy. Pt will come back for the bx.     Plan:   F/u for vulvar biopsy.  Mammogram in Sept  Grace Gregory, M.D., Grace Gregory

## 2019-10-16 ENCOUNTER — Ambulatory Visit: Payer: 59 | Admitting: Internal Medicine

## 2019-10-16 ENCOUNTER — Encounter: Payer: Self-pay | Admitting: Internal Medicine

## 2019-10-16 VITALS — BP 110/62 | HR 91 | Temp 97.9°F | Ht 62.0 in | Wt 233.0 lb

## 2019-10-16 DIAGNOSIS — G4733 Obstructive sleep apnea (adult) (pediatric): Secondary | ICD-10-CM

## 2019-10-16 DIAGNOSIS — J301 Allergic rhinitis due to pollen: Secondary | ICD-10-CM | POA: Diagnosis not present

## 2019-10-16 NOTE — Progress Notes (Signed)
10/16/19- 58 yoF never smoker for sleep evaluation Medical problem list includes HTN, Allergic Rhinitis, OSA, Reactive Airway Disease, GERD, DM2, Lumbar Disc Disease, Psoriatic Arthritis,  Body weight today 233 lbs CPAP auto 0-20 Download compliance 88%, AHI 0.4/ hr Epworth score-3 Medications include Proair hfa, Nasonex, Symbicort 160,  Here with husband. Because of snoring and daytime sleepiness, she had HST in [place], [place] about 5 years ago. She is trying to get that report. Sleep is better and snoring reduced by CPAP using nasal pillows mask at unknown pressure. Has been fully compliant, using CPAP every night. Now needs supplies , qualifying for replacement machine. Needs to establish local DME. Does find CPAP frustrating and pulls off mask a lot in sleep. Has lost 17 lbs over 2 years.   Prior to Admission medications   Medication Sig Start Date End Date Taking? Authorizing Provider  azelastine (ASTELIN) 0.1 % nasal spray Place into both nostrils 2 (two) times daily. Use in each nostril as directed   Yes [provider]  Blood Glucose Monitoring Suppl (FREESTYLE LITE) DEVI Use to test blood sugars 1-2 times daily. 01/29/19  Yes Libby Maw, MD  budesonide-formoterol [facility]) 160-4.5 MCG/ACT inhaler Inhale 2 puffs into the lungs 2 (two) times daily.   Yes [provider]  cetirizine (ZYRTEC) 10 MG tablet Take 10 mg by mouth daily.   Yes [provider]  clobetasol cream (TEMOVATE) AB-123456789 % APPLY 1 APPLICATION TOPICALLY 2 (TWO) TIMES DAILY. 04/14/19  Yes Everitt Amber, MD  estradiol (DOTTI) 0.05 MG/24HR patch Place 1 patch (0.05 mg total) onto the skin 2 (two) times a week. 09/25/19  Yes Lavonia Drafts, MD  gabapentin (NEURONTIN) 300 MG capsule TAKE 1 CAPSULE BY MOUTH TWICE DAILY 07/29/19  Yes Libby Maw, MD  glucose blood (FREESTYLE LITE) test strip Use to test blood sugars 1-2 times daily. 01/29/19  Yes Libby Maw, MD   hydrochlorothiazide (HYDRODIURIL) 12.5 MG tablet TAKE 1 TABLET BY MOUTH ONCE DAILY 10/09/19  Yes Libby Maw, MD  ibuprofen (ADVIL) 800 MG tablet Take 1 tablet (800 mg total) by mouth every 8 (eight) hours as needed. 03/05/19  Yes Libby Maw, MD  Lancets (FREESTYLE) lancets Use to test blood sugars 1-2 times daily. 01/29/19  Yes Libby Maw, MD  losartan (COZAAR) 50 MG tablet TAKE 1 TABLET BY MOUTH ONCE DAILY 10/13/19  Yes Libby Maw, MD  metFORMIN (GLUCOPHAGE) 500 MG tablet TAKE 1 TABLET (500 MG TOTAL) BY MOUTH 2 (TWO) TIMES DAILY WITH A MEAL. 10/09/19  Yes Libby Maw, MD  metoprolol succinate (TOPROL-XL) 25 MG 24 hr tablet TAKE 1 TABLET BY MOUTH ONCE DAILY 07/23/19  Yes Libby Maw, MD  mometasone (NASONEX) 50 MCG/ACT nasal spray PLACE 1 SPRAY IN EACH NOSTRIL. MAY INCREASE TO 2 SPRAYS IN EACH NOSTRIL AS TOLERATED 09/23/19  Yes Libby Maw, MD  montelukast (SINGULAIR) 10 MG tablet Take 1 tablet (10 mg total) by mouth daily. 04/09/19  Yes Libby Maw, MD  omeprazole (PRILOSEC) 40 MG capsule TAKE 1 CAPSULE (40 MG TOTAL) BY MOUTH DAILY. 04/21/19  Yes Libby Maw, MD  PROAIR HFA 108 [phone] Base) MCG/ACT inhaler Use as directed 06/15/17  Yes [provider]  AUVI-Q 0.3 MG/0.3ML SOAJ injection  08/06/19   [provider]   Past Medical History:  Diagnosis Date  . Arthritis   . Arthritis   . GERD (gastroesophageal reflux disease)   . Hypertension   . Seasonal allergies   .  Sleep apnea    Past Surgical History:  Procedure Laterality Date  . ABDOMINAL HYSTERECTOMY    . BACK SURGERY    . VULVECTOMY N/A 08/06/2018   Procedure: WIDE LOCAL EXCISION VULVA;  Surgeon: Everitt Amber, MD;  Location: [facility];  Service: Gynecology;  Laterality: N/A;   Family History  Problem Relation Age of Onset  . Hypertension Mother   . Heart disease Father   . Hypertension Sister   .  Hypertension Brother   . Hypertension Sister   . Hypertension Sister   . Cancer Brother   . Hypertension Brother    Social History   Socioeconomic History  . Marital status: Married    Spouse name: Not on file  . Number of children: Not on file  . Years of education: Not on file  . Highest education level: Not on file  Occupational History  . Not on file  Tobacco Use  . Smoking status: Never Smoker  . Smokeless tobacco: Never Used  Substance and Sexual Activity  . Alcohol use: Never  . Drug use: Never  . Sexual activity: Yes    Birth control/protection: None  Other Topics Concern  . Not on file  Social History Narrative  . Not on file   Social Determinants of Health   Financial Resource Strain:   . Difficulty of Paying Living Expenses:   Food Insecurity:   . Worried About Charity fundraiser in the Last Year:   . Arboriculturist in the Last Year:   Transportation Needs:   . Film/video editor (Medical):   Marland Kitchen Lack of Transportation (Non-Medical):   Physical Activity:   . Days of Exercise per Week:   . Minutes of Exercise per Session:   Stress:   . Feeling of Stress :   Social Connections:   . Frequency of Communication with Friends and Family:   . Frequency of Social Gatherings with Friends and Family:   . Attends Religious Services:   . Active Member of Clubs or Organizations:   . Attends Archivist Meetings:   Marland Kitchen Marital Status:   Intimate Partner Violence:   . Fear of Current or Ex-Partner:   . Emotionally Abused:   Marland Kitchen Physically Abused:   . Sexually Abused:    ROS-see HPI   + = positive Constitutional:    weight loss, night sweats, fevers, chills, fatigue, lassitude. HEENT:    headaches, difficulty swallowing, tooth/dental problems, sore throat,       sneezing, itching, ear ache, nasal congestion, post nasal drip, snoring CV:    chest pain, orthopnea, PND, swelling in lower extremities, anasarca,                                  dizziness,  palpitations Resp:   +shortness of breath with exertion or at rest.                productive cough,   non-productive cough, coughing up of blood.              change in color of mucus.  wheezing.   Skin:    rash or lesions. GI:  No-   heartburn, indigestion, abdominal pain, nausea, vomiting, diarrhea,                 change in bowel habits, loss of appetite GU: dysuria, change in color of urine, no urgency  or frequency.   flank pain. MS:   joint pain, stiffness, decreased range of motion, back pain. Neuro-     nothing unusual Psych:  change in mood or affect.  depression or anxiety.   memory loss.  OBJ- Physical Exam General- Alert, Oriented, Affect-appropriate, Distress- none acute, + obese Skin- rash-none, lesions- none, excoriation- none Lymphadenopathy- none Head- atraumatic            Eyes- Gross vision intact, PERRLA, conjunctivae and secretions clear            Ears- Hearing, canals-normal            Nose- Clear, no-Septal dev, mucus, polyps, erosion, perforation             Throat- Mallampati IV , mucosa clear , drainage- none, tonsils- atrophic,  + teeth Neck- flexible , trachea midline, no stridor , thyroid nl, carotid no bruit Chest - symmetrical excursion , unlabored           Heart/CV- RRR , no murmur , no gallop  , no rub, nl s1 s2                           - JVD- none , edema- none, stasis changes- none, varices- none           Lung- clear to P&A, wheeze- none, cough- none , dullness-none, rub- none           Chest wall-  Abd-  Br/ Gen/ Rectal- Not done, not indicated Extrem- cyanosis- none, clubbing, none, atrophy- none, strength- nl Neuro- grossly intact to observation

## 2019-10-16 NOTE — Assessment & Plan Note (Signed)
Reports benefit from CPAP and good compliance. We did detailed discussion of alternatives to CPAP and she will want to learn more about oral appliance option. Current;y she is due for replacement of machine and supplies. She is trying to get original diagnostic study, otw we will do an HST to update documentation. Plan- get previous sleep study report. Continue CPAP

## 2019-10-16 NOTE — Assessment & Plan Note (Signed)
Nasal congestion may occasionally impact CPAP use. Consider this with future mask choices.

## 2019-10-16 NOTE — Patient Instructions (Signed)
Order- new DME- replacement CPAP mask of choice, humidifier, supplies, AirView/ card   Dx OSA  If you can provide a copy of your original diagnostic sleep study, we can submit it to your new DME company to get a replacement for your old CPAP machine  Please call if we can help

## 2019-10-17 DIAGNOSIS — J3089 Other allergic rhinitis: Secondary | ICD-10-CM | POA: Diagnosis not present

## 2019-10-17 DIAGNOSIS — J301 Allergic rhinitis due to pollen: Secondary | ICD-10-CM | POA: Diagnosis not present

## 2019-10-21 ENCOUNTER — Other Ambulatory Visit: Payer: Self-pay | Admitting: Family Medicine

## 2019-10-21 ENCOUNTER — Telehealth: Payer: Self-pay | Admitting: Internal Medicine

## 2019-10-21 DIAGNOSIS — J3089 Other allergic rhinitis: Secondary | ICD-10-CM | POA: Diagnosis not present

## 2019-10-21 DIAGNOSIS — J301 Allergic rhinitis due to pollen: Secondary | ICD-10-CM | POA: Diagnosis not present

## 2019-10-21 MED FILL — DOTTI 0.05 MG/24HR PTTW: 0.05 | 28 days supply | Qty: 8 | Fill #1

## 2019-10-21 MED FILL — METOPROLOL SUCCINATE ER 25: 25 | 90 days supply | Qty: 90 | Fill #0

## 2019-10-21 MED FILL — SYMBICORT 160-4.5 MCG INH: 160-4.5 | 30 days supply | Qty: 10 | Fill #0

## 2019-10-21 NOTE — Telephone Encounter (Signed)
Checked with CY and in CY's paperwork, did not see sleep study on this pt.  CY verified that he has not seen this sleep study either.  Called pt to make aware.  States she will call the [place] office to have sleep study faxed.  Verified our fax #.  Nothing further needed at this time- will close encounter.

## 2019-10-23 ENCOUNTER — Telehealth: Payer: Self-pay | Admitting: Internal Medicine

## 2019-10-23 DIAGNOSIS — G4733 Obstructive sleep apnea (adult) (pediatric): Secondary | ICD-10-CM

## 2019-10-23 NOTE — Telephone Encounter (Signed)
CY any record of the sleep study being received?

## 2019-10-30 DIAGNOSIS — J301 Allergic rhinitis due to pollen: Secondary | ICD-10-CM | POA: Diagnosis not present

## 2019-10-30 DIAGNOSIS — J3089 Other allergic rhinitis: Secondary | ICD-10-CM | POA: Diagnosis not present

## 2019-10-30 NOTE — Telephone Encounter (Signed)
I still have not received the diagnostic sleep study record on Mrs Riso.  Please order a home sleep test for dx OSA, and ask her to call us for results and recommendations about 2 weeks after her study is done. See my note for call to her husband Dorothyann Peng as well.

## 2019-10-30 NOTE — Telephone Encounter (Signed)
CY - please advise. Thanks! 

## 2019-10-30 NOTE — Telephone Encounter (Signed)
Spoke with patient. She stated that she and her husband Grace Gregory both have been on the phone multiple times with [facility] for the sleep studies. [facility] mailed the studies to her instead of faxing them to our office. She is ok with proceeding with a HST. Did advise her that the HSTs are out 4-6wks. She verbalized understanding.   Order has been placed. Nothing further needed at time of call.

## 2019-11-03 MED FILL — GABAPENTIN 300 MG CAPSULE: 300 | 30 days supply | Qty: 60 | Fill #3

## 2019-11-05 DIAGNOSIS — J301 Allergic rhinitis due to pollen: Secondary | ICD-10-CM | POA: Diagnosis not present

## 2019-11-05 DIAGNOSIS — J3089 Other allergic rhinitis: Secondary | ICD-10-CM | POA: Diagnosis not present

## 2019-11-12 DIAGNOSIS — J3081 Allergic rhinitis due to animal (cat) (dog) hair and dander: Secondary | ICD-10-CM | POA: Diagnosis not present

## 2019-11-12 DIAGNOSIS — J301 Allergic rhinitis due to pollen: Secondary | ICD-10-CM | POA: Diagnosis not present

## 2019-11-17 MED FILL — DOTTI 0.05 MG/24HR PTTW: 0.05 | 28 days supply | Qty: 8 | Fill #2

## 2019-11-18 DIAGNOSIS — J3089 Other allergic rhinitis: Secondary | ICD-10-CM | POA: Diagnosis not present

## 2019-11-18 DIAGNOSIS — J301 Allergic rhinitis due to pollen: Secondary | ICD-10-CM | POA: Diagnosis not present

## 2019-11-19 ENCOUNTER — Ambulatory Visit (INDEPENDENT_AMBULATORY_CARE_PROVIDER_SITE_OTHER): Payer: 59 | Admitting: Obstetrics & Gynecology

## 2019-11-19 ENCOUNTER — Other Ambulatory Visit: Payer: Self-pay

## 2019-11-19 ENCOUNTER — Other Ambulatory Visit (HOSPITAL_COMMUNITY)
Admission: RE | Admit: 2019-11-19 | Discharge: 2019-11-19 | Disposition: A | Discharge status: Home / Self Care | Payer: 59 | Source: Ambulatory Visit | Attending: Obstetrics & Gynecology | Admitting: Obstetrics & Gynecology

## 2019-11-19 ENCOUNTER — Encounter: Payer: Self-pay | Admitting: Obstetrics & Gynecology

## 2019-11-19 VITALS — BP 108/61 | HR 69 | Ht 62.0 in | Wt 235.0 lb

## 2019-11-19 DIAGNOSIS — N9089 Other specified noninflammatory disorders of vulva and perineum: Secondary | ICD-10-CM | POA: Insufficient documentation

## 2019-11-19 DIAGNOSIS — N903 Dysplasia of vulva, unspecified: Secondary | ICD-10-CM | POA: Diagnosis not present

## 2019-11-19 DIAGNOSIS — N900 Mild vulvar dysplasia: Secondary | ICD-10-CM | POA: Diagnosis not present

## 2019-11-19 NOTE — Progress Notes (Signed)
Pt with a h/o VIN III now with a new lesion in a different location at the introitus presents for a vulvar bx.  08/06/2018 Vulva, excision, left posterior - HIGH GRADE VULVAR INTRAEPITHELIAL NEOPLASIA, VIN-III (SEVERE DYSPLASIA). - MARGINS NOT INVOLVED. - NO INVASIVE CARCINOMA  VULVAR BIOPSY NOTE The indications for vulvar biopsy (rule out neoplasia diagnosis) were reviewed.   Risks of the biopsy including pain, bleeding, infection, inadequate specimen, scarring and need for additional procedures  were discussed. The patient stated understanding and agreed to undergo procedure today. Consent was signed,  time out performed.  The patient's vulva was prepped with Betadine. Hurricane spray was used at the biopsy site. 1% lidocaine was injected at the hyperpigmented area at the introitus in 2 sites.  A 4-mm punch biopsy was used, biopsy tissue was picked up with sterile forceps and sterile scissors were used to excise the lesion.  Small bleeding was noted and hemostasis was achieved using pressure alone.   The patient tolerated the procedure well. Post-procedure instructions  (pelvic rest for two weeks) was given to the patient. The patient is to call with heavy bleeding, fever greater than 100.4, foul smelling vaginal discharge or other concerns. The patient will be return to clinic in two weeks for discussion of results and eval.  Amayrani Bennick L. Harraway-Smith, M.D., [name]

## 2019-11-21 LAB — SURGICAL PATHOLOGY

## 2019-11-26 DIAGNOSIS — J301 Allergic rhinitis due to pollen: Secondary | ICD-10-CM | POA: Diagnosis not present

## 2019-11-26 DIAGNOSIS — J3089 Other allergic rhinitis: Secondary | ICD-10-CM | POA: Diagnosis not present

## 2019-12-01 ENCOUNTER — Other Ambulatory Visit: Payer: Self-pay | Admitting: Family Medicine

## 2019-12-01 MED FILL — GABAPENTIN 300 MG CAPSULE: 300 | 30 days supply | Qty: 60 | Fill #0

## 2019-12-02 DIAGNOSIS — J3089 Other allergic rhinitis: Secondary | ICD-10-CM | POA: Diagnosis not present

## 2019-12-02 DIAGNOSIS — J301 Allergic rhinitis due to pollen: Secondary | ICD-10-CM | POA: Diagnosis not present

## 2019-12-03 DIAGNOSIS — J301 Allergic rhinitis due to pollen: Secondary | ICD-10-CM | POA: Diagnosis not present

## 2019-12-04 ENCOUNTER — Ambulatory Visit: Payer: 59

## 2019-12-04 ENCOUNTER — Other Ambulatory Visit: Payer: Self-pay

## 2019-12-04 DIAGNOSIS — G4733 Obstructive sleep apnea (adult) (pediatric): Secondary | ICD-10-CM

## 2019-12-04 DIAGNOSIS — J3089 Other allergic rhinitis: Secondary | ICD-10-CM | POA: Diagnosis not present

## 2019-12-05 DIAGNOSIS — G4733 Obstructive sleep apnea (adult) (pediatric): Secondary | ICD-10-CM | POA: Diagnosis not present

## 2019-12-09 ENCOUNTER — Telehealth: Payer: Self-pay | Admitting: Internal Medicine

## 2019-12-09 ENCOUNTER — Other Ambulatory Visit: Payer: Self-pay | Admitting: *Deleted

## 2019-12-09 DIAGNOSIS — G4733 Obstructive sleep apnea (adult) (pediatric): Secondary | ICD-10-CM

## 2019-12-09 NOTE — Telephone Encounter (Signed)
error 

## 2019-12-09 NOTE — Addendum Note (Signed)
Addended by: Lorretta Harp on: 12/09/2019 03:20 PM   Modules accepted: Orders

## 2019-12-10 ENCOUNTER — Ambulatory Visit (INDEPENDENT_AMBULATORY_CARE_PROVIDER_SITE_OTHER): Payer: 59 | Admitting: Obstetrics & Gynecology

## 2019-12-10 ENCOUNTER — Encounter: Payer: Self-pay | Admitting: Obstetrics & Gynecology

## 2019-12-10 ENCOUNTER — Other Ambulatory Visit: Payer: Self-pay

## 2019-12-10 VITALS — BP 131/71 | HR 69 | Ht 62.0 in | Wt 232.0 lb

## 2019-12-10 DIAGNOSIS — N900 Mild vulvar dysplasia: Secondary | ICD-10-CM

## 2019-12-10 DIAGNOSIS — J3089 Other allergic rhinitis: Secondary | ICD-10-CM | POA: Diagnosis not present

## 2019-12-10 DIAGNOSIS — J301 Allergic rhinitis due to pollen: Secondary | ICD-10-CM | POA: Diagnosis not present

## 2019-12-10 MED FILL — DOTTI 0.05 MG/24HR PTTW: 0.05 | 28 days supply | Qty: 8 | Fill #3

## 2019-12-10 MED FILL — MOMETASONE FUROATE 50 MCG S: 50 | 30 days supply | Qty: 17 | Fill #1

## 2019-12-10 NOTE — Progress Notes (Signed)
History:  59 y.o. G1P0010 here today for f/u after vulvar biopsy. She reports that the vulva feel better but, "every so often she feels a twinge of something." She denies new problems.   The following portions of the patient's history were reviewed and updated as appropriate: allergies, current medications, past family history, past medical history, past social history, past surgical history and problem list.  Review of Systems:  Pertinent items are noted in HPI.    Objective:  Physical Exam Blood pressure 131/71, pulse 69, height 5\' 2"  (1.575 m), weight 232 lb (105.2 kg).  CONSTITUTIONAL: Well-developed, well-nourished female in no acute distress.  HENT:  Normocephalic, atraumatic EYES: Conjunctivae and EOM are normal. No scleral icterus.  NECK: Normal range of motion SKIN: Skin is warm and dry. No rash noted. Not diaphoretic.No pallor. [place]: Alert and oriented to person, place, and time. Normal coordination.   Pelvic: Normal appearing external genitalia; The areas at the introitus that were bx'd are WNL.   Labs and Imaging 11/19/2019 FINAL MICROSCOPIC DIAGNOSIS:   A. VULVA, BIOPSY:  - Low-grade squamous intraepithelial lesion (VIN1, low grade dysplasia)   Assessment & Plan:  VIN I. Pt with h/o VIN prev. She has had a partial vulvar resection. I have discussed with her regular surveillance. Reviewed increased risk of vulvar cancer as these are precancerous lesions.   All questions answered.   F/u in 6 months or sooner prn   Total face-to-face time with patient was 15 min.  Greater than 50% was spent in counseling and coordination of care with the patient.   Aniza Shor L. Harraway-Smith, M.D., [name]

## 2019-12-15 DIAGNOSIS — J301 Allergic rhinitis due to pollen: Secondary | ICD-10-CM | POA: Diagnosis not present

## 2019-12-15 DIAGNOSIS — J3089 Other allergic rhinitis: Secondary | ICD-10-CM | POA: Diagnosis not present

## 2019-12-22 MED FILL — SYMBICORT 160-4.5 MCG INH: 160-4.5 | 30 days supply | Qty: 10 | Fill #1

## 2019-12-23 DIAGNOSIS — J3089 Other allergic rhinitis: Secondary | ICD-10-CM | POA: Diagnosis not present

## 2019-12-23 DIAGNOSIS — J301 Allergic rhinitis due to pollen: Secondary | ICD-10-CM | POA: Diagnosis not present

## 2019-12-29 MED FILL — GABAPENTIN 300 MG CAPSULE: 300 | 30 days supply | Qty: 60 | Fill #1

## 2019-12-30 DIAGNOSIS — J3089 Other allergic rhinitis: Secondary | ICD-10-CM | POA: Diagnosis not present

## 2019-12-30 DIAGNOSIS — J301 Allergic rhinitis due to pollen: Secondary | ICD-10-CM | POA: Diagnosis not present

## 2020-01-01 DIAGNOSIS — J3089 Other allergic rhinitis: Secondary | ICD-10-CM | POA: Diagnosis not present

## 2020-01-01 DIAGNOSIS — J301 Allergic rhinitis due to pollen: Secondary | ICD-10-CM | POA: Diagnosis not present

## 2020-01-06 ENCOUNTER — Other Ambulatory Visit: Payer: Self-pay | Admitting: Family Medicine

## 2020-01-06 DIAGNOSIS — J301 Allergic rhinitis due to pollen: Secondary | ICD-10-CM | POA: Diagnosis not present

## 2020-01-06 DIAGNOSIS — J3089 Other allergic rhinitis: Secondary | ICD-10-CM | POA: Diagnosis not present

## 2020-01-06 MED FILL — HYDROCHLOROTHIAZIDE 12.5 MG: 12.5 | 90 days supply | Qty: 90 | Fill #0

## 2020-01-07 ENCOUNTER — Telehealth: Payer: Self-pay

## 2020-01-07 ENCOUNTER — Other Ambulatory Visit: Payer: Self-pay

## 2020-01-07 DIAGNOSIS — Z1231 Encounter for screening mammogram for malignant neoplasm of breast: Secondary | ICD-10-CM

## 2020-01-07 NOTE — Telephone Encounter (Signed)
Pt called stating she noticed blood when she wiped this morning. Pt is scheduled for an OV on 01/19/20. Sidni Fusco l Brandan Glauber, CMA

## 2020-01-09 DIAGNOSIS — J3089 Other allergic rhinitis: Secondary | ICD-10-CM | POA: Diagnosis not present

## 2020-01-09 DIAGNOSIS — J3081 Allergic rhinitis due to animal (cat) (dog) hair and dander: Secondary | ICD-10-CM | POA: Diagnosis not present

## 2020-01-09 DIAGNOSIS — J301 Allergic rhinitis due to pollen: Secondary | ICD-10-CM | POA: Diagnosis not present

## 2020-01-12 ENCOUNTER — Other Ambulatory Visit: Payer: Self-pay | Admitting: Family Medicine

## 2020-01-12 DIAGNOSIS — E1165 Type 2 diabetes mellitus with hyperglycemia: Secondary | ICD-10-CM

## 2020-01-12 MED FILL — METFORMIN HCL 500 MG TABS: 500 | 90 days supply | Qty: 180 | Fill #0

## 2020-01-12 MED FILL — DOTTI 0.05 MG/24HR PTTW: 0.05 | 28 days supply | Qty: 8 | Fill #4

## 2020-01-12 MED FILL — OMEPRAZOLE 40 MG CPDR: 40 | 90 days supply | Qty: 90 | Fill #0

## 2020-01-12 MED FILL — LOSARTAN POTASSIUM 50 MG TA: 50 | 90 days supply | Qty: 90 | Fill #0

## 2020-01-14 ENCOUNTER — Encounter: Payer: Self-pay | Admitting: Family Medicine

## 2020-01-14 DIAGNOSIS — R1013 Epigastric pain: Secondary | ICD-10-CM

## 2020-01-14 DIAGNOSIS — J3089 Other allergic rhinitis: Secondary | ICD-10-CM | POA: Diagnosis not present

## 2020-01-14 DIAGNOSIS — K760 Fatty (change of) liver, not elsewhere classified: Secondary | ICD-10-CM

## 2020-01-14 DIAGNOSIS — J301 Allergic rhinitis due to pollen: Secondary | ICD-10-CM | POA: Diagnosis not present

## 2020-01-15 ENCOUNTER — Other Ambulatory Visit: Payer: Self-pay

## 2020-01-15 ENCOUNTER — Telehealth: Payer: Self-pay | Admitting: Internal Medicine

## 2020-01-15 MED ORDER — FREESTYLE LITE DEVI: 0 refills | Status: AC

## 2020-01-15 NOTE — Telephone Encounter (Signed)
Yes

## 2020-01-15 NOTE — Telephone Encounter (Signed)
Called and spoke to [place] with [facility]. She states they no longer have Airsense 10 (CPAP), they now have AirSense 11. The pt's order indicated AirSense 10. She requested a verbal order for pt to get the newer model. I gave VO. Wells Guiles verbalized understanding and denied any further questions or concerns at this time.

## 2020-01-19 ENCOUNTER — Ambulatory Visit: Payer: 59 | Admitting: Internal Medicine

## 2020-01-19 ENCOUNTER — Ambulatory Visit: Payer: 59 | Admitting: Family Medicine

## 2020-01-19 ENCOUNTER — Other Ambulatory Visit: Payer: Self-pay | Admitting: Family Medicine

## 2020-01-19 DIAGNOSIS — J3089 Other allergic rhinitis: Secondary | ICD-10-CM | POA: Diagnosis not present

## 2020-01-19 DIAGNOSIS — J301 Allergic rhinitis due to pollen: Secondary | ICD-10-CM | POA: Diagnosis not present

## 2020-01-19 DIAGNOSIS — J3081 Allergic rhinitis due to animal (cat) (dog) hair and dander: Secondary | ICD-10-CM | POA: Diagnosis not present

## 2020-01-19 DIAGNOSIS — J4540 Moderate persistent asthma, uncomplicated: Secondary | ICD-10-CM | POA: Diagnosis not present

## 2020-01-19 MED FILL — METOPROLOL SUCCINATE ER 25: 25 | 90 days supply | Qty: 90 | Fill #0

## 2020-01-19 MED FILL — MONTELUKAST SOD 10 MG TAB: 10 | 90 days supply | Qty: 90 | Fill #2

## 2020-01-23 ENCOUNTER — Telehealth: Payer: Self-pay | Admitting: Family Medicine

## 2020-01-23 NOTE — Telephone Encounter (Signed)
Patient sent a MyChart message requesting to schedule an appointment for overall GI and malaise upset, flu shot, blood work. Called and left patient a message to give the office a call back to schedule.

## 2020-01-27 ENCOUNTER — Ambulatory Visit: Payer: 59 | Admitting: Family Medicine

## 2020-01-27 ENCOUNTER — Other Ambulatory Visit: Payer: Self-pay

## 2020-01-27 ENCOUNTER — Encounter: Payer: Self-pay | Admitting: Family Medicine

## 2020-01-27 VITALS — BP 130/84 | HR 84 | Temp 96.6°F | Ht 62.0 in | Wt 232.6 lb

## 2020-01-27 DIAGNOSIS — E1165 Type 2 diabetes mellitus with hyperglycemia: Secondary | ICD-10-CM | POA: Diagnosis not present

## 2020-01-27 DIAGNOSIS — L409 Psoriasis, unspecified: Secondary | ICD-10-CM

## 2020-01-27 DIAGNOSIS — I10 Essential (primary) hypertension: Secondary | ICD-10-CM | POA: Diagnosis not present

## 2020-01-27 DIAGNOSIS — R1013 Epigastric pain: Secondary | ICD-10-CM | POA: Diagnosis not present

## 2020-01-27 DIAGNOSIS — G4733 Obstructive sleep apnea (adult) (pediatric): Secondary | ICD-10-CM

## 2020-01-27 DIAGNOSIS — E7800 Pure hypercholesterolemia, unspecified: Secondary | ICD-10-CM

## 2020-01-27 DIAGNOSIS — K219 Gastro-esophageal reflux disease without esophagitis: Secondary | ICD-10-CM | POA: Diagnosis not present

## 2020-01-27 DIAGNOSIS — Z0000 Encounter for general adult medical examination without abnormal findings: Secondary | ICD-10-CM

## 2020-01-27 MED ORDER — CLOBETASOL PROPIONATE 0.05 % EX CREA: 1.0000 "application " | TOPICAL_CREAM | Freq: Two times a day (BID) | CUTANEOUS | 0 refills | Status: AC

## 2020-01-27 MED FILL — CLOBETASOL PROPIONATE 0.05: 0.05 | 30 days supply | Qty: 60 | Fill #0

## 2020-01-27 NOTE — Progress Notes (Addendum)
Established Patient Office Visit  Subjective:  Patient ID: Grace Gregory, female    DOB: [birth year]  Age: 59 y.o. MRN: [id number]  CC:  Chief Complaint  Patient presents with  . Follow-up    Diabetes/labs, not fasting,  indigestion/stomach issues x 2 months, constipation     HPI Grace Gregory presents for follow-up of hypertension, diabetes, sleep apnea and a recent onset of midepigastric pain.  Ongoing gnawing pain in her midepigastrium area.  Sometimes eating in general tends to set it off.  Spicy foods make it worse.  Denies exacerbation after a fatty meal.  She has been taking omeprazole 40 mg daily.  She has tried Tums and Mylanta without much relief.  There is been little weight Gregory.  Blood pressure has been controlled with losartan and metoprolol.  Continues low-dose Glucophage for diabetes.  No recent eye check.  Request to repeat pill of clobetasol for psoriasis.  She typically has lesions on her elbows knees ankles and hands.  She uses it nowhere else.  Past Medical History:  Diagnosis Date  . Arthritis   . Arthritis   . GERD (gastroesophageal reflux disease)   . Hypertension   . Seasonal allergies   . Sleep apnea     Past Surgical History:  Procedure Laterality Date  . ABDOMINAL HYSTERECTOMY    . BACK SURGERY    . VULVECTOMY N/A 08/06/2018   Procedure: WIDE LOCAL EXCISION VULVA;  Surgeon: Everitt Amber, MD;  Location: [facility];  Service: Gynecology;  Laterality: N/A;    Family History  Problem Relation Age of Onset  . Hypertension Mother   . Heart disease Father   . Hypertension Sister   . Hypertension Brother   . Hypertension Sister   . Hypertension Sister   . Cancer Brother   . Hypertension Brother     Social History   Socioeconomic History  . Marital status: Married    Spouse name: Not on file  . Number of children: Not on file  . Years of education: Not on file  . Highest education level: Not on file  Occupational  History  . Not on file  Tobacco Use  . Smoking status: Never Smoker  . Smokeless tobacco: Never Used  Vaping Use  . Vaping Use: Never used  Substance and Sexual Activity  . Alcohol use: Never  . Drug use: Never  . Sexual activity: Yes    Birth control/protection: None  Other Topics Concern  . Not on file  Social History Narrative  . Not on file   Social Determinants of Health   Financial Resource Strain:   . Difficulty of Paying Living Expenses: Not on file  Food Insecurity:   . Worried About Charity fundraiser in the Last Year: Not on file  . Ran Out of Food in the Last Year: Not on file  Transportation Needs:   . Lack of Transportation (Medical): Not on file  . Lack of Transportation (Non-Medical): Not on file  Physical Activity:   . Days of Exercise per Week: Not on file  . Minutes of Exercise per Session: Not on file  Stress:   . Feeling of Stress : Not on file  Social Connections:   . Frequency of Communication with Friends and Family: Not on file  . Frequency of Social Gatherings with Friends and Family: Not on file  . Attends Religious Services: Not on file  . Active Member of Clubs or Organizations: Not on file  .  Attends Archivist Meetings: Not on file  . Marital Status: Not on file  Intimate Partner Violence:   . Fear of Current or Ex-Partner: Not on file  . Emotionally Abused: Not on file  . Physically Abused: Not on file  . Sexually Abused: Not on file    Outpatient Medications Prior to Visit  Medication Sig Dispense Refill  . AUVI-Q 0.3 MG/0.3ML SOAJ injection     . azelastine (ASTELIN) 0.1 % nasal spray Place into both nostrils 2 (two) times daily. Use in each nostril as directed    . Blood Glucose Monitoring Suppl (FREESTYLE LITE) DEVI Use to test blood sugars 1-2 times daily. 1 each 0  . budesonide-formoterol (SYMBICORT) 160-4.5 MCG/ACT inhaler Inhale 2 puffs into the lungs 2 (two) times daily.    . cetirizine (ZYRTEC) 10 MG tablet Take  10 mg by mouth daily.    Marland Kitchen estradiol (DOTTI) 0.05 MG/24HR patch Place 1 patch (0.05 mg total) onto the skin 2 (two) times a week. 8 patch 12  . gabapentin (NEURONTIN) 300 MG capsule TAKE 1 CAPSULE BY MOUTH TWICE DAILY 60 capsule 3  . glucose blood (FREESTYLE LITE) test strip Use to test blood sugars 1-2 times daily. 100 each 12  . hydrochlorothiazide (HYDRODIURIL) 12.5 MG tablet TAKE 1 TABLET BY MOUTH ONCE DAILY 90 tablet 0  . ibuprofen (ADVIL) 800 MG tablet Take 1 tablet (800 mg total) by mouth every 8 (eight) hours as needed. 30 tablet 0  . Lancets (FREESTYLE) lancets Use to test blood sugars 1-2 times daily. 100 each 12  . losartan (COZAAR) 50 MG tablet TAKE 1 TABLET BY MOUTH ONCE DAILY 90 tablet 0  . metoprolol succinate (TOPROL-XL) 25 MG 24 hr tablet TAKE 1 TABLET BY MOUTH ONCE DAILY 90 tablet 0  . mometasone (NASONEX) 50 MCG/ACT nasal spray PLACE 1 SPRAY IN EACH NOSTRIL. MAY INCREASE TO 2 SPRAYS IN EACH NOSTRIL AS TOLERATED 17 g 2  . montelukast (SINGULAIR) 10 MG tablet Take 1 tablet (10 mg total) by mouth daily. 90 tablet 2  . omeprazole (PRILOSEC) 40 MG capsule TAKE 1 CAPSULE BY MOUTH ONCE DAILY 90 capsule 2  . PROAIR HFA 108 (90 Base) MCG/ACT inhaler Use as directed    . clobetasol cream (TEMOVATE) 2.53 % APPLY 1 APPLICATION TOPICALLY 2 (TWO) TIMES DAILY. 30 g 0  . metFORMIN (GLUCOPHAGE) 500 MG tablet TAKE 1 TABLET BY MOUTH TWICE DAILY WITH A MEAL 180 tablet 0   No facility-administered medications prior to visit.    Allergies  Allergen Reactions  . Lisinopril Cough  . Other Rash  . Penicillins Rash    ROS Review of Systems  Constitutional: Negative.   HENT: Negative.   Eyes: Negative for photophobia and visual disturbance.  Respiratory: Negative.   Cardiovascular: Negative.   Gastrointestinal: Positive for abdominal pain. Negative for blood in stool, constipation, nausea and vomiting.  Endocrine: Negative for polyphagia and polyuria.  Genitourinary: Negative.     Musculoskeletal: Negative for gait problem and joint swelling.  Allergic/Immunologic: Negative for immunocompromised state.  Neurological: Negative for speech difficulty and light-headedness.  Hematological: Does not bruise/bleed easily.  Psychiatric/Behavioral: Negative.       Objective:    Physical Exam Vitals and nursing note reviewed.  Constitutional:      General: She is not in acute distress.    Appearance: Normal appearance. She is not ill-appearing or diaphoretic.  HENT:     Head: Normocephalic and atraumatic.     Right Ear: Tympanic  membrane, ear canal and external ear normal. There is no impacted cerumen.     Left Ear: Tympanic membrane, ear canal and external ear normal. There is no impacted cerumen.     Nose: Nose normal.     Mouth/Throat:     Mouth: Mucous membranes are moist.     Pharynx: Oropharynx is clear. No oropharyngeal exudate or posterior oropharyngeal erythema.  Eyes:     General: No scleral icterus.       Right eye: No discharge.        Left eye: No discharge.     Conjunctiva/sclera: Conjunctivae normal.  Cardiovascular:     Rate and Rhythm: Normal rate and regular rhythm.     Pulses:          Dorsalis pedis pulses are 1+ on the right side and 1+ on the left side.       Posterior tibial pulses are 1+ on the right side and 1+ on the left side.  Pulmonary:     Effort: Pulmonary effort is normal.     Breath sounds: Normal breath sounds.  Abdominal:     General: Bowel sounds are normal.  Musculoskeletal:     Cervical back: No rigidity or tenderness.     Right lower leg: No edema.     Left lower leg: No edema.  Lymphadenopathy:     Cervical: No cervical adenopathy.  Skin:    General: Skin is warm and dry.  Neurological:     Mental Status: She is alert and oriented to person, place, and time.  Psychiatric:        Mood and Affect: Mood normal.        Behavior: Behavior normal.    Diabetic Foot Exam - Simple   Simple Foot Form Visual  Inspection No deformities, no ulcerations, no other skin breakdown bilaterally: Yes Sensation Testing Intact to touch and monofilament testing bilaterally: Yes Pulse Check Posterior Tibialis and Dorsalis pulse intact bilaterally: Yes Comments     BP 130/84   Pulse 84   Temp (!) 96.6 F (35.9 C) (Temporal)   Ht 5\' 2"  (1.575 m)   Wt 232 lb 9.6 oz (105.5 kg)   LMP  (LMP Unknown)   SpO2 96%   BMI 42.54 kg/m  Wt Readings from Last 3 Encounters:  01/27/20 232 lb 9.6 oz (105.5 kg)  12/10/19 232 lb (105.2 kg)  11/19/19 235 lb (106.6 kg)     Health Maintenance Due  Topic Date Due  . Hepatitis C Screening  Never done  . PNEUMOCOCCAL POLYSACCHARIDE VACCINE AGE 26-64 HIGH RISK  Never done  . INFLUENZA VACCINE  12/21/2019  . TETANUS/TDAP  12/21/2019  . OPHTHALMOLOGY EXAM  01/03/2020  . FOOT EXAM  01/28/2020    There are no preventive care reminders to display for this patient.  Lab Results  Component Value Date   TSH 3.09 12/31/2018   Lab Results  Component Value Date   WBC 6.4 01/28/2020   HGB 13.1 01/28/2020   HCT 40.4 01/28/2020   MCV 87.9 01/28/2020   PLT 314.0 01/28/2020   Lab Results  Component Value Date   NA 137 01/28/2020   K 4.0 01/28/2020   CO2 28 01/28/2020   GLUCOSE 187 (H) 01/28/2020   BUN 14 01/28/2020   CREATININE 0.81 01/28/2020   BILITOT 0.3 01/28/2020   ALKPHOS 75 01/28/2020   AST 15 01/28/2020   ALT 22 01/28/2020   PROT 7.0 01/28/2020   ALBUMIN 4.1 01/28/2020  CALCIUM 9.8 01/28/2020   ANIONGAP 10 03/07/2019   GFR 87.24 01/28/2020   Lab Results  Component Value Date   CHOL 208 (H) 01/28/2020   Lab Results  Component Value Date   HDL 47.90 01/28/2020   Lab Results  Component Value Date   LDLCALC 127 (H) 01/28/2020   Lab Results  Component Value Date   TRIG 167.0 (H) 01/28/2020   Lab Results  Component Value Date   CHOLHDL 4 01/28/2020   Lab Results  Component Value Date   HGBA1C 8.7 (H) 01/28/2020      Assessment &  Plan:   Problem List Items Addressed This Visit      Cardiovascular and Mediastinum   Essential hypertension   Relevant Medications   atorvastatin (LIPITOR) 10 MG tablet   Other Relevant Orders   CBC (Completed)   Comprehensive metabolic panel (Completed)   Hemoglobin A1c (Completed)   Microalbumin / creatinine urine ratio (Completed)   Urinalysis, Routine w reflex microscopic (Completed)     Respiratory   Obstructive sleep apnea syndrome     Digestive   Gastroesophageal reflux disease     Endocrine   Type 2 diabetes mellitus with hyperglycemia, without long-term current use of insulin (HCC) - Primary   Relevant Medications   metFORMIN (GLUCOPHAGE-XR) 500 MG 24 hr tablet   atorvastatin (LIPITOR) 10 MG tablet   Other Relevant Orders   Comprehensive metabolic panel (Completed)   Microalbumin / creatinine urine ratio (Completed)   Urinalysis, Routine w reflex microscopic (Completed)   Ambulatory referral to Ophthalmology     Musculoskeletal and Integument   Psoriasis   Relevant Medications   clobetasol cream (TEMOVATE) 0.05 %     Other   Healthcare maintenance   Relevant Orders   Lipid panel (Completed)   Epigastric pain   Relevant Orders   US Abdomen Complete   Amylase (Completed)   Lipase (Completed)    Other Visit Diagnoses    Elevated LDL cholesterol level       Relevant Medications   atorvastatin (LIPITOR) 10 MG tablet      Meds ordered this encounter  Medications  . clobetasol cream (TEMOVATE) 0.05 %    Sig: Apply 1 application topically 2 (two) times daily. Not for face or private areas.    Dispense:  60 g    Refill:  0  . metFORMIN (GLUCOPHAGE-XR) 500 MG 24 hr tablet    Sig: Take 2 tablets twice daily.    Dispense:  360 tablet    Refill:  0  . atorvastatin (LIPITOR) 10 MG tablet    Sig: Take 1 tablet (10 mg total) by mouth daily.    Dispense:  90 tablet    Refill:  3    Follow-up: Return in about 3 months (around 04/27/2020), or return fasting  for blood work tomorrow..  We discussed the need for closer follow-up until her diabetes is under better control.  Discussed using a statin for cholesterol.  Have increased the Glucophage to 1000 mg twice daily and added 10 mg of atorvastatin daily.  Libby Maw, MD

## 2020-01-28 ENCOUNTER — Other Ambulatory Visit: Payer: 59

## 2020-01-28 ENCOUNTER — Other Ambulatory Visit (INDEPENDENT_AMBULATORY_CARE_PROVIDER_SITE_OTHER): Payer: 59

## 2020-01-28 DIAGNOSIS — E1165 Type 2 diabetes mellitus with hyperglycemia: Secondary | ICD-10-CM

## 2020-01-28 DIAGNOSIS — J301 Allergic rhinitis due to pollen: Secondary | ICD-10-CM | POA: Diagnosis not present

## 2020-01-28 DIAGNOSIS — Z0000 Encounter for general adult medical examination without abnormal findings: Secondary | ICD-10-CM

## 2020-01-28 DIAGNOSIS — R1013 Epigastric pain: Secondary | ICD-10-CM | POA: Diagnosis not present

## 2020-01-28 DIAGNOSIS — I10 Essential (primary) hypertension: Secondary | ICD-10-CM

## 2020-01-28 DIAGNOSIS — J3089 Other allergic rhinitis: Secondary | ICD-10-CM | POA: Diagnosis not present

## 2020-01-28 LAB — URINALYSIS, ROUTINE W REFLEX MICROSCOPIC
Bilirubin Urine: NEGATIVE
Hgb urine dipstick: NEGATIVE
Nitrite: NEGATIVE
RBC / HPF: NONE SEEN (ref 0–?)
Specific Gravity, Urine: 1.025 (ref 1.000–1.030)
Total Protein, Urine: NEGATIVE
Urine Glucose: NEGATIVE
Urobilinogen, UA: 0.2 (ref 0.0–1.0)
pH: 5.5 (ref 5.0–8.0)

## 2020-01-28 LAB — CBC
HCT: 40.4 % (ref 36.0–46.0)
Hemoglobin: 13.1 g/dL (ref 12.0–15.0)
MCHC: 32.5 g/dL (ref 30.0–36.0)
MCV: 87.9 fl (ref 78.0–100.0)
Platelets: 314 10*3/uL (ref 150.0–400.0)
RBC: 4.59 Mil/uL (ref 3.87–5.11)
RDW: 14.8 % (ref 11.5–15.5)
WBC: 6.4 10*3/uL (ref 4.0–10.5)

## 2020-01-28 LAB — COMPREHENSIVE METABOLIC PANEL
ALT: 22 U/L (ref 0–35)
AST: 15 U/L (ref 0–37)
Albumin: 4.1 g/dL (ref 3.5–5.2)
Alkaline Phosphatase: 75 U/L (ref 39–117)
BUN: 14 mg/dL (ref 6–23)
CO2: 28 mEq/L (ref 19–32)
Calcium: 9.8 mg/dL (ref 8.4–10.5)
Chloride: 99 mEq/L (ref 96–112)
Creatinine, Ser: 0.81 mg/dL (ref 0.40–1.20)
GFR: 87.24 mL/min (ref >60.00)
Glucose, Bld: 187 mg/dL — ABNORMAL HIGH (ref 70–99)
Potassium: 4 mEq/L (ref 3.5–5.1)
Sodium: 137 mEq/L (ref 135–145)
Total Bilirubin: 0.3 mg/dL (ref 0.2–1.2)
Total Protein: 7 g/dL (ref 6.0–8.3)

## 2020-01-28 LAB — LIPASE: Lipase: 22 U/L (ref 11.0–59.0)

## 2020-01-28 LAB — MICROALBUMIN / CREATININE URINE RATIO
Creatinine,U: 235.8 mg/dL
Microalb Creat Ratio: 0.8 mg/g (ref 0.0–30.0)
Microalb, Ur: 1.9 mg/dL (ref 0.0–1.9)

## 2020-01-28 LAB — LIPID PANEL
Cholesterol: 208 mg/dL — ABNORMAL HIGH (ref 0–200)
HDL: 47.9 mg/dL (ref >39.00)
LDL Cholesterol: 127 mg/dL — ABNORMAL HIGH (ref 0–99)
NonHDL: 160.37
Total CHOL/HDL Ratio: 4
Triglycerides: 167 mg/dL — ABNORMAL HIGH (ref 0.0–149.0)
VLDL: 33.4 mg/dL (ref 0.0–40.0)

## 2020-01-28 LAB — AMYLASE: Amylase: 32 U/L (ref 27–131)

## 2020-01-28 LAB — HEMOGLOBIN A1C: Hgb A1c MFr Bld: 8.7 % — ABNORMAL HIGH (ref 4.6–6.5)

## 2020-01-29 ENCOUNTER — Other Ambulatory Visit: Payer: Self-pay | Admitting: Family Medicine

## 2020-01-29 MED ORDER — ATORVASTATIN CALCIUM 10 MG PO TABS: 10.0000 mg | ORAL_TABLET | Freq: Every day | ORAL | 3 refills | Status: DC

## 2020-01-29 MED ORDER — METFORMIN HCL ER 500 MG PO TB24: ORAL_TABLET | ORAL | 0 refills | Status: DC

## 2020-01-29 MED FILL — GABAPENTIN 300 MG CAPSULE: 300 | 30 days supply | Qty: 60 | Fill #2

## 2020-01-29 MED FILL — ATORVASTATIN CALCIUM 10 MG: 10 | 90 days supply | Qty: 90 | Fill #0

## 2020-01-29 MED FILL — metFORMIN HCL ER 500 MG TB2: 500 | 90 days supply | Qty: 360 | Fill #0

## 2020-01-29 NOTE — Addendum Note (Signed)
Addended by: Jon Billings on: 01/29/2020 07:38 AM   Modules accepted: Orders

## 2020-02-02 ENCOUNTER — Encounter: Payer: Self-pay | Admitting: Family Medicine

## 2020-02-02 ENCOUNTER — Ambulatory Visit
Admission: RE | Admit: 2020-02-02 | Discharge: 2020-02-02 | Disposition: A | Discharge status: Home / Self Care | Payer: 59 | Source: Ambulatory Visit | Attending: Family Medicine | Admitting: Family Medicine

## 2020-02-02 DIAGNOSIS — R1013 Epigastric pain: Secondary | ICD-10-CM

## 2020-02-02 DIAGNOSIS — J3089 Other allergic rhinitis: Secondary | ICD-10-CM | POA: Diagnosis not present

## 2020-02-02 DIAGNOSIS — K760 Fatty (change of) liver, not elsewhere classified: Secondary | ICD-10-CM | POA: Diagnosis not present

## 2020-02-02 DIAGNOSIS — J301 Allergic rhinitis due to pollen: Secondary | ICD-10-CM | POA: Diagnosis not present

## 2020-02-03 ENCOUNTER — Ambulatory Visit: Payer: 59 | Admitting: Family Medicine

## 2020-02-03 DIAGNOSIS — G4733 Obstructive sleep apnea (adult) (pediatric): Secondary | ICD-10-CM | POA: Diagnosis not present

## 2020-02-05 NOTE — Telephone Encounter (Signed)
See also note for [place] results. Will refer to GI for follow up of abdominal pain. Patient has fatty liver disease. This does not cause pain. Will refer to GI.

## 2020-02-09 MED FILL — DOTTI 0.05 MG/24HR PTTW: 0.05 | 28 days supply | Qty: 8 | Fill #5

## 2020-02-09 MED FILL — SYMBICORT 160-4.5 MCG INH: 160-4.5 | 30 days supply | Qty: 10 | Fill #0

## 2020-02-10 ENCOUNTER — Encounter (HOSPITAL_BASED_OUTPATIENT_CLINIC_OR_DEPARTMENT_OTHER): Payer: Self-pay

## 2020-02-10 ENCOUNTER — Ambulatory Visit (HOSPITAL_BASED_OUTPATIENT_CLINIC_OR_DEPARTMENT_OTHER)
Admission: RE | Admit: 2020-02-10 | Discharge: 2020-02-10 | Disposition: A | Discharge status: Home / Self Care | Payer: 59 | Source: Ambulatory Visit | Attending: Obstetrics & Gynecology | Admitting: Obstetrics & Gynecology

## 2020-02-10 ENCOUNTER — Other Ambulatory Visit: Payer: Self-pay

## 2020-02-10 DIAGNOSIS — J3089 Other allergic rhinitis: Secondary | ICD-10-CM | POA: Diagnosis not present

## 2020-02-10 DIAGNOSIS — J3081 Allergic rhinitis due to animal (cat) (dog) hair and dander: Secondary | ICD-10-CM | POA: Diagnosis not present

## 2020-02-10 DIAGNOSIS — Z1231 Encounter for screening mammogram for malignant neoplasm of breast: Secondary | ICD-10-CM | POA: Diagnosis not present

## 2020-02-10 DIAGNOSIS — J301 Allergic rhinitis due to pollen: Secondary | ICD-10-CM | POA: Diagnosis not present

## 2020-02-10 MED FILL — FLUARIX QUADRIVALENT 0.5 ML: 0.5 | 1 days supply | Qty: 1 | Fill #0

## 2020-02-17 DIAGNOSIS — J3089 Other allergic rhinitis: Secondary | ICD-10-CM | POA: Diagnosis not present

## 2020-02-17 DIAGNOSIS — J301 Allergic rhinitis due to pollen: Secondary | ICD-10-CM | POA: Diagnosis not present

## 2020-02-18 ENCOUNTER — Ambulatory Visit (HOSPITAL_BASED_OUTPATIENT_CLINIC_OR_DEPARTMENT_OTHER): Payer: 59

## 2020-02-24 DIAGNOSIS — J3089 Other allergic rhinitis: Secondary | ICD-10-CM | POA: Diagnosis not present

## 2020-02-24 DIAGNOSIS — J301 Allergic rhinitis due to pollen: Secondary | ICD-10-CM | POA: Diagnosis not present

## 2020-03-02 DIAGNOSIS — J3089 Other allergic rhinitis: Secondary | ICD-10-CM | POA: Diagnosis not present

## 2020-03-02 DIAGNOSIS — J301 Allergic rhinitis due to pollen: Secondary | ICD-10-CM | POA: Diagnosis not present

## 2020-03-04 MED FILL — GABAPENTIN 300 MG CAPSULE: 300 | 30 days supply | Qty: 60 | Fill #3

## 2020-03-08 MED FILL — DOTTI 0.05 MG/24HR PTTW: 0.05 | 28 days supply | Qty: 8 | Fill #6

## 2020-03-09 ENCOUNTER — Other Ambulatory Visit (HOSPITAL_BASED_OUTPATIENT_CLINIC_OR_DEPARTMENT_OTHER): Payer: Self-pay | Admitting: Internal Medicine

## 2020-03-09 ENCOUNTER — Ambulatory Visit: Payer: 59 | Attending: Internal Medicine

## 2020-03-09 DIAGNOSIS — Z23 Encounter for immunization: Secondary | ICD-10-CM

## 2020-03-09 NOTE — Progress Notes (Signed)
   Covid-19 Vaccination Clinic  Name:  Byanca Kasper    MRN: [id number] DOB: April 08, 1961  03/09/2020  Ms. Astle was observed post Covid-19 immunization for 15 minuteswithout incident. She was provided with Vaccine Information Sheet and instruction to access the V-Safe system.   Ms. Schroeck was instructed to call 911 with any severe reactions post vaccine: Marland Kitchen Difficulty breathing  . Swelling of face and throat  . A fast heartbeat  . A bad rash all over body  . Dizziness and weakness

## 2020-03-12 DIAGNOSIS — J301 Allergic rhinitis due to pollen: Secondary | ICD-10-CM | POA: Diagnosis not present

## 2020-03-12 DIAGNOSIS — J3089 Other allergic rhinitis: Secondary | ICD-10-CM | POA: Diagnosis not present

## 2020-03-15 DIAGNOSIS — J3089 Other allergic rhinitis: Secondary | ICD-10-CM | POA: Diagnosis not present

## 2020-03-15 DIAGNOSIS — J301 Allergic rhinitis due to pollen: Secondary | ICD-10-CM | POA: Diagnosis not present

## 2020-03-15 DIAGNOSIS — J3081 Allergic rhinitis due to animal (cat) (dog) hair and dander: Secondary | ICD-10-CM | POA: Diagnosis not present

## 2020-03-15 MED FILL — AZELASTINE HCL 137 MCG/SPRA: 137 | 25 days supply | Qty: 30 | Fill #1

## 2020-03-15 MED FILL — MOMETASONE FUROATE 50 MCG S: 50 | 30 days supply | Qty: 17 | Fill #2

## 2020-03-16 DIAGNOSIS — H52222 Regular astigmatism, left eye: Secondary | ICD-10-CM | POA: Diagnosis not present

## 2020-03-16 DIAGNOSIS — H0102A Squamous blepharitis right eye, upper and lower eyelids: Secondary | ICD-10-CM | POA: Diagnosis not present

## 2020-03-16 DIAGNOSIS — H524 Presbyopia: Secondary | ICD-10-CM | POA: Diagnosis not present

## 2020-03-16 DIAGNOSIS — H5203 Hypermetropia, bilateral: Secondary | ICD-10-CM | POA: Diagnosis not present

## 2020-03-16 DIAGNOSIS — H0102B Squamous blepharitis left eye, upper and lower eyelids: Secondary | ICD-10-CM | POA: Diagnosis not present

## 2020-03-16 DIAGNOSIS — H35033 Hypertensive retinopathy, bilateral: Secondary | ICD-10-CM | POA: Diagnosis not present

## 2020-03-16 DIAGNOSIS — H16143 Punctate keratitis, bilateral: Secondary | ICD-10-CM | POA: Diagnosis not present

## 2020-03-16 DIAGNOSIS — E119 Type 2 diabetes mellitus without complications: Secondary | ICD-10-CM | POA: Diagnosis not present

## 2020-03-16 LAB — HM DIABETES EYE EXAM

## 2020-03-16 MED FILL — PFIZER-BIONTECH COVID-19 VA: 30 | 1 days supply | Qty: 0 | Fill #0

## 2020-03-18 ENCOUNTER — Ambulatory Visit: Payer: 59 | Admitting: Gastroenterology

## 2020-03-18 ENCOUNTER — Other Ambulatory Visit: Payer: Self-pay | Admitting: Gastroenterology

## 2020-03-18 ENCOUNTER — Encounter: Payer: Self-pay | Admitting: Gastroenterology

## 2020-03-18 VITALS — BP 120/72 | HR 67 | Ht 62.0 in | Wt 231.5 lb

## 2020-03-18 DIAGNOSIS — Z1211 Encounter for screening for malignant neoplasm of colon: Secondary | ICD-10-CM | POA: Diagnosis not present

## 2020-03-18 DIAGNOSIS — K219 Gastro-esophageal reflux disease without esophagitis: Secondary | ICD-10-CM

## 2020-03-18 DIAGNOSIS — R1013 Epigastric pain: Secondary | ICD-10-CM | POA: Diagnosis not present

## 2020-03-18 DIAGNOSIS — R196 Halitosis: Secondary | ICD-10-CM | POA: Diagnosis not present

## 2020-03-18 DIAGNOSIS — K760 Fatty (change of) liver, not elsewhere classified: Secondary | ICD-10-CM

## 2020-03-18 MED ORDER — CLENPIQ 10-3.5-12 MG-GM -GM/160ML PO SOLN: 1.0000 | ORAL | 0 refills | Status: DC

## 2020-03-18 MED FILL — CLENPIQ 10-3.5-12 MG-GM -GM: 10-3.5-12 M | 30 days supply | Qty: 320 | Fill #0

## 2020-03-18 NOTE — Progress Notes (Signed)
Chief Complaint: Epigastric pain   Referring Provider:     Libby Maw, MD   HPI:     Grace Gregory is a 59 y.o. female with a history of diabetes, HTN, obesity (BMI 42), OSA (on CPAP), referred to the [facility]ology Clinic for evaluation of midepigastric pain along with increased gas, change in bowel habits.  She describes pain in the MEG with associated HB and indigestion.  Symptoms tend to be postprandial, and worse with spicy foods. Rare episodes of regurgitation and sour brash. Has trialed course of Tums, Mylanta (no change), then prescribed omeprazole 40 mg/day by PCM.  Sxs were present for 2-3 months, but have subsided over last couple of weeks. Thinks sxs improved after starting PPI, decreasing tomato consumption (was eating a lot of tomato sandwiches over summer/early fall). No dysphagia. Still taking omeprazole 40 mg/day with good control of above sxs.   Separately, she reports bad breath, which is worsening lately. Tends to eat a lot of garlic and onions.   Has 2-4 stools/day for as long as she can remember. No nocturnal stools. No hematochezia or melena. No straining or change in stool consistency.   Recent work-up notable for the following: -Normal amylase, lipase, CBC -Elevated fasting blood glucose, otherwise normal CMP -A1c 8.7% -Abdominal ultrasound (02/02/2020): Hepatic steatosis normal liver.  Normal GB, bile ducts  Had a swallow study many years ago in [place] which was normal per patient. Has been previously diagnosed with lactose intolerance.   Endoscopic History: -Colonoscopy (2011, outside facility): Normal per patient.  No record in EMR  No known family history of CRC, GI malignancy, liver disease, pancreatic disease, or IBD.    Past Medical History:  Diagnosis Date  . Arthritis   . Asthma   . Diabetes ([place])   . Elevated cholesterol   . GERD (gastroesophageal reflux disease)   . Hypertension   . Obesity   .  Seasonal allergies   . Sleep apnea    On CPAP Machine      Past Surgical History:  Procedure Laterality Date  . ABDOMINAL HYSTERECTOMY    . BACK SURGERY    . COLONOSCOPY     age 89 in [place]. Normal   . VULVECTOMY N/A 08/06/2018   Procedure: WIDE LOCAL EXCISION VULVA;  Surgeon: Everitt Amber, MD;  Location: [facility];  Service: Gynecology;  Laterality: N/A;   Family History  Problem Relation Age of Onset  . Hypertension Mother   . Heart disease Father   . Hypertension Sister   . Hypertension Brother   . Hypertension Sister   . Hypertension Sister   . Brain cancer Brother   . Hypertension Brother   . Colon cancer Neg Hx   . Esophageal cancer Neg Hx    Social History   Tobacco Use  . Smoking status: Never Smoker  . Smokeless tobacco: Never Used  Vaping Use  . Vaping Use: Never used  Substance Use Topics  . Alcohol use: Yes    Comment: occassionally   . Drug use: Never   Current Outpatient Medications  Medication Sig Dispense Refill  . AMBULATORY NON FORMULARY MEDICATION Medication Name: Allergy shot x2 a week    . atorvastatin (LIPITOR) 10 MG tablet Take 1 tablet (10 mg total) by mouth daily. 90 tablet 3  . azelastine (ASTELIN) 0.1 % nasal spray Place into both nostrils 2 (two) times daily. Use in each  nostril as directed    . Blood Glucose Monitoring Suppl (FREESTYLE LITE) DEVI Use to test blood sugars 1-2 times daily. 1 each 0  . budesonide-formoterol (SYMBICORT) 160-4.5 MCG/ACT inhaler Inhale 2 puffs into the lungs 2 (two) times daily.    . cetirizine (ZYRTEC) 10 MG tablet Take 10 mg by mouth daily.    . clobetasol cream (TEMOVATE) 1.61 % Apply 1 application topically 2 (two) times daily. Not for face or private areas. (Patient taking differently: Apply 1 application topically as needed. Not for face or private areas.) 60 g 0  . estradiol (DOTTI) 0.05 MG/24HR patch Place 1 patch (0.05 mg total) onto the skin 2 (two) times a week. 8 patch 12  .  gabapentin (NEURONTIN) 300 MG capsule TAKE 1 CAPSULE BY MOUTH TWICE DAILY 60 capsule 3  . glucose blood (FREESTYLE LITE) test strip Use to test blood sugars 1-2 times daily. 100 each 12  . hydrochlorothiazide (HYDRODIURIL) 12.5 MG tablet TAKE 1 TABLET BY MOUTH ONCE DAILY 90 tablet 0  . Lancets (FREESTYLE) lancets Use to test blood sugars 1-2 times daily. 100 each 12  . losartan (COZAAR) 50 MG tablet TAKE 1 TABLET BY MOUTH ONCE DAILY 90 tablet 0  . metFORMIN (GLUCOPHAGE-XR) 500 MG 24 hr tablet Take 2 tablets twice daily. 360 tablet 0  . metoprolol succinate (TOPROL-XL) 25 MG 24 hr tablet TAKE 1 TABLET BY MOUTH ONCE DAILY 90 tablet 0  . mometasone (NASONEX) 50 MCG/ACT nasal spray PLACE 1 SPRAY IN EACH NOSTRIL. MAY INCREASE TO 2 SPRAYS IN EACH NOSTRIL AS TOLERATED 17 g 2  . montelukast (SINGULAIR) 10 MG tablet Take 1 tablet (10 mg total) by mouth daily. 90 tablet 2  . omeprazole (PRILOSEC) 40 MG capsule TAKE 1 CAPSULE BY MOUTH ONCE DAILY 90 capsule 2  . PROAIR HFA 108 (90 Base) MCG/ACT inhaler 1 puff as needed. Use as directed    . AUVI-Q 0.3 MG/0.3ML SOAJ injection  (Patient not taking: Reported on 03/18/2020)     No current facility-administered medications for this visit.   Allergies  Allergen Reactions  . Lisinopril Cough  . Other Rash  . Penicillins Rash     Review of Systems: All systems reviewed and negative except where noted in HPI.     Physical Exam:    Wt Readings from Last 3 Encounters:  03/18/20 231 lb 8 oz (105 kg)  01/27/20 232 lb 9.6 oz (105.5 kg)  12/10/19 232 lb (105.2 kg)    Ht 5\' 2"  (1.575 m)   Wt 231 lb 8 oz (105 kg)   LMP  (LMP Unknown)   BMI 42.34 kg/m  Constitutional:  Pleasant, in no acute distress. Psychiatric: Normal mood and affect. Behavior is normal. EENT: Pupils normal.  Conjunctivae are normal. No scleral icterus. Neck supple. No cervical LAD. Cardiovascular: Normal rate, regular rhythm. No edema Pulmonary/chest: Effort normal and breath  sounds normal. No wheezing, rales or rhonchi. Abdominal: Soft, nondistended, nontender. Bowel sounds active throughout. There are no masses palpable. No hepatomegaly. Neurological: Alert and oriented to person place and time. Skin: Skin is warm and dry. No rashes noted.   ASSESSMENT AND PLAN;   1) Epigastric pain 2) Heartburn -Suspect symptoms related to underlying reflux with perhaps some atypical features -EGD to evaluate for erosive esophagitis, LES laxity, hiatal hernia, along with PUD, gastritis, H. pylori, etc. -Continue omeprazole as per prescribed -Continue dietary modifications with avoidance of exacerbating foods  3) Halitosis -Discussed GI non-GI etiologies, to include reflux, gastroparesis,  etc. can also be related to her diet, which is particularly high eating garlic and onions -Evaluate with EGD as above -Has appointment with her Dentist next week.  Recommended she bring this up for discussion at that time as well  4) Colon cancer cleaning -Due for age-appropriate, average risk colon cancer screening -Schedule colonoscopy  5) Hepatic steatosis -Abdominal ultrasound with incidental diagnosis of hepatic steatosis.  Otherwise normal liver enzymes -Briefly discussed fatty liver with relation to body habitus, underlying comorbidities/metabolic syndrome  The indications, risks, and benefits of EGD and colonoscopy were explained to the patient in detail. Risks include but are not limited to bleeding, perforation, adverse reaction to medications, and cardiopulmonary compromise. Sequelae include but are not limited to the possibility of surgery, hositalization, and mortality. The patient verbalized understanding and wished to proceed. All questions answered, referred to scheduler and bowel prep ordered. Further recommendations pending results of the exam.     Lavena Bullion, DO, FACG  03/18/2020, 9:05 AM   Libby Maw,*

## 2020-03-18 NOTE — Patient Instructions (Addendum)
If you are age 60 or older, your body mass index should be between 23-30. Your Body mass index is 42.34 kg/m. If this is out of the aforementioned range listed, please consider follow up with your Primary Care Provider.  If you are age 65 or younger, your body mass index should be between 19-25. Your Body mass index is 42.34 kg/m. If this is out of the aformentioned range listed, please consider follow up with your Primary Care Provider.   We have sent the following medications to your pharmacy for you to pick up at your convenience:  Clenpiq   Due to recent changes in healthcare laws, you may see the results of your imaging and laboratory studies on MyChart before your provider has had a chance to review them.  We understand that in some cases there may be results that are confusing or concerning to you. Not all laboratory results come back in the same time frame and the provider may be waiting for multiple results in order to interpret others.  Please give [place] 48 hours in order for your provider to thoroughly review all the results before contacting the office for clarification of your results.   It was a pleasure to see you today!  Dr. Bryan Lemma

## 2020-03-22 ENCOUNTER — Telehealth: Payer: Self-pay | Admitting: General Surgery

## 2020-03-22 NOTE — Telephone Encounter (Signed)
Spoke with Patient to clarify what she would like me to relay to Dr Bryan Lemma. She is concerned with having a failed colonoscopy because of not being cleaned out.  She states her husband used Clenpiq and needs to repeat do to a poor prep and she does not want that for herself. Is there another prep you suggest or something you would like me to add to the clenpiq?

## 2020-03-23 NOTE — Telephone Encounter (Signed)
Error

## 2020-03-30 ENCOUNTER — Other Ambulatory Visit: Payer: Self-pay | Admitting: Family Medicine

## 2020-03-30 ENCOUNTER — Encounter: Payer: Self-pay | Admitting: Family Medicine

## 2020-03-30 MED FILL — GABAPENTIN 300 MG CAPSULE: 300 | 30 days supply | Qty: 60 | Fill #0

## 2020-03-30 MED FILL — HYDROCHLOROTHIAZIDE 12.5 MG: 12.5 | 90 days supply | Qty: 90 | Fill #0

## 2020-03-30 NOTE — Telephone Encounter (Signed)
Last OV 01/27/20 Last fill for Gabapentin 12/01/19  #60/3 Last fill for [facility] 01/06/20  #90/0

## 2020-04-06 ENCOUNTER — Telehealth: Payer: Self-pay

## 2020-04-06 ENCOUNTER — Other Ambulatory Visit: Payer: Self-pay

## 2020-04-06 DIAGNOSIS — E2839 Other primary ovarian failure: Secondary | ICD-10-CM

## 2020-04-06 MED ORDER — ESTRADIOL 0.025 MG/24HR TD PTTW: 1.0000 | MEDICATED_PATCH | TRANSDERMAL | 12 refills | Status: DC

## 2020-04-06 MED ORDER — ESTRADIOL 0.05 MG/24HR TD PTTW: 1.0000 | MEDICATED_PATCH | TRANSDERMAL | 12 refills | Status: DC

## 2020-04-06 MED FILL — VIVELLE-DOT 0.05 MG PATCH: 0.05 | 28 days supply | Qty: 8 | Fill #0

## 2020-04-06 NOTE — Telephone Encounter (Signed)
Pt states her Rx for generic estradiol patches is $100. Pt states she will pay less for the brand name.  A new Rx for brand name Vivelle Dot was sent to the pt's pharmacy.  Juniper Snyders l Shaleigh Laubscher, CMA

## 2020-04-07 ENCOUNTER — Encounter: Payer: Self-pay | Admitting: Gastroenterology

## 2020-04-09 DIAGNOSIS — G4733 Obstructive sleep apnea (adult) (pediatric): Secondary | ICD-10-CM | POA: Diagnosis not present

## 2020-04-13 ENCOUNTER — Other Ambulatory Visit (HOSPITAL_BASED_OUTPATIENT_CLINIC_OR_DEPARTMENT_OTHER): Payer: Self-pay

## 2020-04-13 MED FILL — METHYLPREDNISOLONE 4 MG TBP: 4 | 6 days supply | Qty: 21 | Fill #0

## 2020-04-19 ENCOUNTER — Other Ambulatory Visit: Payer: Self-pay | Admitting: Family Medicine

## 2020-04-19 MED FILL — OMEPRAZOLE 40 MG CPDR: 40 | 30 days supply | Qty: 30 | Fill #1

## 2020-04-19 MED FILL — ATORVASTATIN CALCIUM 10 MG: 10 | 30 days supply | Qty: 30 | Fill #1

## 2020-04-20 ENCOUNTER — Other Ambulatory Visit: Payer: Self-pay | Admitting: Family Medicine

## 2020-04-20 MED FILL — LOSARTAN POTASSIUM 50 MG TA: 50 | 30 days supply | Qty: 30 | Fill #0

## 2020-04-20 MED FILL — METOPROLOL SUCCINATE ER 25: 25 | 30 days supply | Qty: 30 | Fill #0

## 2020-04-20 NOTE — Telephone Encounter (Signed)
Last Ov 01/27/20 Last fill Losartan 01/12/20  #90/0 Last fill for Metoprolol 01/19/20  #90/0

## 2020-04-21 ENCOUNTER — Ambulatory Visit (AMBULATORY_SURGERY_CENTER): Payer: 59 | Admitting: Gastroenterology

## 2020-04-21 ENCOUNTER — Other Ambulatory Visit: Payer: Self-pay

## 2020-04-21 ENCOUNTER — Encounter: Payer: Self-pay | Admitting: Gastroenterology

## 2020-04-21 VITALS — BP 133/55 | HR 69 | Temp 97.3°F | Resp 22 | Ht 62.0 in | Wt 231.0 lb

## 2020-04-21 DIAGNOSIS — R1013 Epigastric pain: Secondary | ICD-10-CM

## 2020-04-21 DIAGNOSIS — K2970 Gastritis, unspecified, without bleeding: Secondary | ICD-10-CM

## 2020-04-21 DIAGNOSIS — Z1211 Encounter for screening for malignant neoplasm of colon: Secondary | ICD-10-CM | POA: Diagnosis not present

## 2020-04-21 DIAGNOSIS — K5730 Diverticulosis of large intestine without perforation or abscess without bleeding: Secondary | ICD-10-CM

## 2020-04-21 DIAGNOSIS — K2950 Unspecified chronic gastritis without bleeding: Secondary | ICD-10-CM

## 2020-04-21 DIAGNOSIS — K219 Gastro-esophageal reflux disease without esophagitis: Secondary | ICD-10-CM

## 2020-04-21 DIAGNOSIS — K640 First degree hemorrhoids: Secondary | ICD-10-CM

## 2020-04-21 DIAGNOSIS — D120 Benign neoplasm of cecum: Secondary | ICD-10-CM

## 2020-04-21 DIAGNOSIS — D125 Benign neoplasm of sigmoid colon: Secondary | ICD-10-CM

## 2020-04-21 DIAGNOSIS — K760 Fatty (change of) liver, not elsewhere classified: Secondary | ICD-10-CM

## 2020-04-21 DIAGNOSIS — R12 Heartburn: Secondary | ICD-10-CM

## 2020-04-21 MED ORDER — SODIUM CHLORIDE 0.9 % IV SOLN: 500.0000 mL | Freq: Once | INTRAVENOUS | Status: DC

## 2020-04-21 NOTE — Progress Notes (Signed)
pt tolerated well. VSS. awake and to recovery. Report given to RN. Bite block inserted and removed without trauma. 

## 2020-04-21 NOTE — Op Note (Signed)
[place] Patient Name: Grace Gregory Procedure Date: 04/21/2020 4:10 PM MRN: [id number] Endoscopist: Gerrit Heck , MD Age: 59 Referring MD:  Date of Birth: [birth year] Gender: Female Account #: [id number] Procedure:                Upper GI endoscopy Indications:              Epigastric abdominal pain, Heartburn, Suspected                            esophageal reflux, Regurgitation Medicines:                Monitored Anesthesia Care Procedure:                Pre-Anesthesia Assessment:                           - Prior to the procedure, a History and Physical                            was performed, and patient medications and                            allergies were reviewed. The patient's tolerance of                            previous anesthesia was also reviewed. The risks                            and benefits of the procedure and the sedation                            options and risks were discussed with the patient.                            All questions were answered, and informed consent                            was obtained. Prior Anticoagulants: The patient has                            taken no previous anticoagulant or antiplatelet                            agents. ASA Grade Assessment: III - A patient with                            severe systemic disease. After reviewing the risks                            and benefits, the patient was deemed in                            satisfactory condition to undergo the procedure.  After obtaining informed consent, the endoscope was                            passed under direct vision. Throughout the                            procedure, the patient's blood pressure, pulse, and                            oxygen saturations were monitored continuously. The                            Endoscope was introduced through the mouth, and                            advanced to the second  part of duodenum. The upper                            GI endoscopy was accomplished without difficulty.                            The patient tolerated the procedure well. Scope In: Scope Out: Findings:                 The examined esophagus was normal.                           The Z-line was regular and was found 38 cm from the                            incisors.                           The entire examined stomach was normal. Biopsies                            were taken with a cold forceps for Helicobacter                            pylori testing. Estimated blood loss was minimal.                           The examined duodenum was normal. Complications:            No immediate complications. Estimated Blood Loss:     Estimated blood loss was minimal. Impression:               - Normal esophagus.                           - Z-line regular, 38 cm from the incisors.                           - Normal stomach. Biopsied.                           -  Normal examined duodenum. Recommendation:           - Patient has a contact number available for                            emergencies. The signs and symptoms of potential                            delayed complications were discussed with the                            patient. Return to normal activities tomorrow.                            Written discharge instructions were provided to the                            patient.                           - Resume previous diet.                           - Continue present medications.                           - Await pathology results. Gerrit Heck, MD 04/21/2020 4:44:03 PM

## 2020-04-21 NOTE — Progress Notes (Signed)
VS by [place]  I have reviewed the patient's medical history in detail and updated the computerized patient record.  

## 2020-04-21 NOTE — Progress Notes (Signed)
Pt's states no medical or surgical changes since previsit or office visit.  CW - vitals 

## 2020-04-21 NOTE — Progress Notes (Signed)
Called to room to assist during endoscopic procedure.  Patient ID and intended procedure confirmed with present staff. Received instructions for my participation in the procedure from the performing physician.  

## 2020-04-21 NOTE — Op Note (Signed)
[place] Patient Name: Grace Gregory Procedure Date: 04/21/2020 4:09 PM MRN: [id number] Endoscopist: Gerrit Heck , MD Age: 59 Referring MD:  Date of Birth: [birth year] Gender: Female Account #: [id number] Procedure:                olonoscopy Indications:              Screening for colorectal malignant neoplasm (last                            colonoscopy was 10 years ago) Medicines:                Monitored Anesthesia Care Procedure:                Pre-Anesthesia Assessment:                           - Prior to the procedure, a History and Physical                            was performed, and patient medications and                            allergies were reviewed. The patient's tolerance of                            previous anesthesia was also reviewed. The risks                            and benefits of the procedure and the sedation                            options and risks were discussed with the patient.                            All questions were answered, and informed consent                            was obtained. Prior Anticoagulants: The patient has                            taken no previous anticoagulant or antiplatelet                            agents. ASA Grade Assessment: III - A patient with                            severe systemic disease. After reviewing the risks                            and benefits, the patient was deemed in                            satisfactory condition to undergo the procedure.  After obtaining informed consent, the colonoscope                            was passed under direct vision. Throughout the                            procedure, the patient's blood pressure, pulse, and                            oxygen saturations were monitored continuously. The                            Colonoscope was introduced through the anus and                            advanced to the the cecum,  identified by                            appendiceal orifice and ileocecal valve. The                            colonoscopy was performed without difficulty. The                            patient tolerated the procedure well. The quality                            of the bowel preparation was good. The ileocecal                            valve, appendiceal orifice, and rectum were                            photographed. Scope In: 4:23:04 PM Scope Out: 4:40:22 PM Scope Withdrawal Time: 0 hours 13 minutes 34 seconds  Total Procedure Duration: 0 hours 17 minutes 18 seconds  Findings:                 The perianal and digital rectal examinations were                            normal.                           Three sessile polyps were found in the sigmoid                            colon (2) and cecum. The polyps were 1 to 3 mm in                            size. These polyps were removed with a cold biopsy                            forceps. Resection and retrieval were complete.  Estimated blood loss was minimal.                           A few small-mouthed diverticula were found in the                            sigmoid colon.                           Non-bleeding internal hemorrhoids were found during                            retroflexion. The hemorrhoids were small. Complications:            No immediate complications. Estimated Blood Loss:     Estimated blood loss was minimal. Impression:               - Three 1 to 3 mm polyps in the sigmoid colon and                            in the cecum, removed with a cold biopsy forceps.                            Resected and retrieved.                           - Diverticulosis in the sigmoid colon.                           - Non-bleeding internal hemorrhoids. Recommendation:           - Patient has a contact number available for                            emergencies. The signs and symptoms of potential                             delayed complications were discussed with the                            patient. Return to normal activities tomorrow.                            Written discharge instructions were provided to the                            patient.                           - Resume previous diet.                           - Continue present medications.                           - Await pathology results.                           -  Repeat colonoscopy for surveillance based on                            pathology results.                           - Return to GI clinic PRN. Gerrit Heck, MD 04/21/2020 4:47:35 PM

## 2020-04-21 NOTE — Patient Instructions (Signed)
Await biopsy results of gastric biopsies   Handouts on polyps, diverticulosis & hemorrhoids given to you today  Await pathology results on polyps removed     YOU HAD AN ENDOSCOPIC PROCEDURE TODAY AT [facility]:   Refer to the procedure report that was given to you for any specific questions about what was found during the examination.  If the procedure report does not answer your questions, please call your gastroenterologist to clarify.  If you requested that your care partner not be given the details of your procedure findings, then the procedure report has been included in a sealed envelope for you to review at your convenience later.  YOU SHOULD EXPECT: Some feelings of bloating in the abdomen. Passage of more gas than usual.  Walking can help get rid of the air that was put into your GI tract during the procedure and reduce the bloating. If you had a lower endoscopy (such as a colonoscopy or flexible sigmoidoscopy) you may notice spotting of blood in your stool or on the toilet paper. If you underwent a bowel prep for your procedure, you may not have a normal bowel movement for a few days.  Please Note:  You might notice some irritation and congestion in your nose or some drainage.  This is from the oxygen used during your procedure.  There is no need for concern and it should clear up in a day or so.  SYMPTOMS TO REPORT IMMEDIATELY:   Following lower endoscopy (colonoscopy or flexible sigmoidoscopy):  Excessive amounts of blood in the stool  Significant tenderness or worsening of abdominal pains  Swelling of the abdomen that is new, acute  Fever of 100F or higher   Following upper endoscopy (EGD)  Vomiting of blood or coffee ground material  New chest pain or pain under the shoulder blades  Painful or persistently difficult swallowing  New shortness of breath  Fever of 100F or higher  Black, tarry-looking stools  For urgent or emergent issues, a  gastroenterologist can be reached at any hour by calling [phone]. Do not use MyChart messaging for urgent concerns.    DIET:  We do recommend a small meal at first, but then you may proceed to your regular diet.  Drink plenty of fluids but you should avoid alcoholic beverages for 24 hours.  ACTIVITY:  You should plan to take it easy for the rest of today and you should NOT DRIVE or use heavy machinery until tomorrow (because of the sedation medicines used during the test).    FOLLOW UP: Our staff will call the number listed on your records 48-72 hours following your procedure to check on you and address any questions or concerns that you may have regarding the information given to you following your procedure. If we do not reach you, we will leave a message.  We will attempt to reach you two times.  During this call, we will ask if you have developed any symptoms of COVID 19. If you develop any symptoms (ie: fever, flu-like symptoms, shortness of breath, cough etc.) before then, please call [phone].  If you test positive for Covid 19 in the 2 weeks post procedure, please call and report this information to [place].    If any biopsies were taken you will be contacted by phone or by letter within the next 1-3 weeks.  Please call us at [phone] if you have not heard about the biopsies in 3 weeks.    SIGNATURES/CONFIDENTIALITY:  You and/or your care partner have signed paperwork which will be entered into your electronic medical record.  These signatures attest to the fact that that the information above on your After Visit Summary has been reviewed and is understood.  Full responsibility of the confidentiality of this discharge information lies with you and/or your care-partner.

## 2020-04-23 ENCOUNTER — Telehealth: Payer: Self-pay

## 2020-04-23 NOTE — Telephone Encounter (Signed)
Agree with recommendations for conservative management as outlined. Can check in on patient this afternoon to see how she is doing before the weekend. Thanks.

## 2020-04-23 NOTE — Telephone Encounter (Signed)
  Follow up Call-  Call back number 04/21/2020  Post procedure Call Back phone  # [phone]  Permission to leave phone message Yes  Some recent data might be hidden     Patient questions:  Do you have a fever, pain , or abdominal swelling? Yes.   Pain Score  3 *  Patient complains of "stomach just hasn't been happy".  Says when she eats her stomach gets upset.  Also says she's having pain both hips since procedure as well.  She took motrin this morning to hopefully help with the pain.  RN recommended to take it easy today and rest if possible and avoid foods that have upset stomach recently.  Have you tolerated food without any problems? No.  Have you been able to return to your normal activities? No.  Do you have any questions about your discharge instructions: Diet   No. Medications  No. Follow up visit  No.  Do you have questions or concerns about your Care? No.  Actions: * If pain score is 4 or above: Physician/ provider Notified : Gerrit Heck, DO 04/23/20 @ 0741  1. Have you developed a fever since your procedure? no  2.   Have you had an respiratory symptoms (SOB or cough) since your procedure? no  3.   Have you tested positive for COVID 19 since your procedure no  4.   Have you had any family members/close contacts diagnosed with the COVID 19 since your procedure?  no   If yes to any of these questions please route to Joylene John, RN and Joella Prince, Therapist, sports.

## 2020-04-23 NOTE — Telephone Encounter (Signed)
Called patient to relay Dr. Bryan Lemma suggests rest and avoiding foods that cause stomach upset.  Reiterated the importance of calling the number provided at discharge for any questions or concerns over the weekend.  Pt states she is feeling somewhat better since the call this morning.

## 2020-04-27 MED FILL — FLOVENT HFA 110 MCG INHALER: 110 | 30 days supply | Qty: 12 | Fill #0

## 2020-04-28 ENCOUNTER — Ambulatory Visit: Payer: 59 | Admitting: Family Medicine

## 2020-04-28 ENCOUNTER — Encounter: Payer: Self-pay | Admitting: Family Medicine

## 2020-04-29 ENCOUNTER — Encounter: Payer: Self-pay | Admitting: Gastroenterology

## 2020-05-03 MED FILL — VIVELLE-DOT 0.05 MG PATCH: 0.05 | 28 days supply | Qty: 8 | Fill #1

## 2020-05-04 ENCOUNTER — Other Ambulatory Visit: Payer: Self-pay | Admitting: Family Medicine

## 2020-05-04 MED FILL — MONTELUKAST SOD 10 MG TAB: 10 | 30 days supply | Qty: 30 | Fill #0

## 2020-05-04 MED FILL — GABAPENTIN 300 MG CAPSULE: 300 | 30 days supply | Qty: 60 | Fill #1

## 2020-05-19 MED FILL — OMEPRAZOLE 40 MG CPDR: 40 | 30 days supply | Qty: 30 | Fill #2

## 2020-05-19 MED FILL — LOSARTAN POTASSIUM 50 MG TA: 50 | 30 days supply | Qty: 30 | Fill #1

## 2020-05-19 MED FILL — METOPROLOL SUCCINATE ER 25: 25 | 30 days supply | Qty: 30 | Fill #1

## 2020-05-25 MED FILL — VIVELLE-DOT 0.05 MG PATCH: 0.05 | 28 days supply | Qty: 8 | Fill #2

## 2020-05-25 MED FILL — ATORVASTATIN CALCIUM 10 MG: 10 | 30 days supply | Qty: 30 | Fill #2

## 2020-05-28 ENCOUNTER — Telehealth: Payer: Self-pay | Admitting: Family Medicine

## 2020-05-28 ENCOUNTER — Other Ambulatory Visit: Payer: Self-pay

## 2020-05-28 DIAGNOSIS — E7800 Pure hypercholesterolemia, unspecified: Secondary | ICD-10-CM

## 2020-05-28 DIAGNOSIS — E1165 Type 2 diabetes mellitus with hyperglycemia: Secondary | ICD-10-CM

## 2020-05-28 DIAGNOSIS — E2839 Other primary ovarian failure: Secondary | ICD-10-CM

## 2020-05-28 MED ORDER — ESTRADIOL 0.05 MG/24HR TD PTTW: 1.0000 | MEDICATED_PATCH | TRANSDERMAL | 12 refills | Status: DC

## 2020-05-28 MED ORDER — GABAPENTIN 300 MG PO CAPS: 300.0000 mg | ORAL_CAPSULE | Freq: Two times a day (BID) | ORAL | 3 refills | Status: DC

## 2020-05-28 MED ORDER — ATORVASTATIN CALCIUM 10 MG PO TABS: 10.0000 mg | ORAL_TABLET | Freq: Every day | ORAL | 3 refills | Status: DC

## 2020-05-28 MED ORDER — MONTELUKAST SODIUM 10 MG PO TABS
10.0000 mg | ORAL_TABLET | Freq: Every day | ORAL | 2 refills | Status: DC
Start: 2020-05-28 — End: 2021-01-20

## 2020-05-28 MED ORDER — LOSARTAN POTASSIUM 50 MG PO TABS
50.0000 mg | ORAL_TABLET | Freq: Every day | ORAL | 0 refills | Status: DC
Start: 2020-05-28 — End: 2020-08-22

## 2020-05-28 MED ORDER — METFORMIN HCL ER 500 MG PO TB24: ORAL_TABLET | ORAL | 0 refills | Status: DC

## 2020-05-28 MED ORDER — OMEPRAZOLE 40 MG PO CPDR: 40.0000 mg | DELAYED_RELEASE_CAPSULE | Freq: Every day | ORAL | 2 refills | Status: DC

## 2020-05-28 MED ORDER — HYDROCHLOROTHIAZIDE 12.5 MG PO TABS: 12.5000 mg | ORAL_TABLET | Freq: Every day | ORAL | 0 refills | Status: DC

## 2020-05-28 MED ORDER — METOPROLOL SUCCINATE ER 25 MG PO TB24
25.0000 mg | ORAL_TABLET | Freq: Every day | ORAL | 0 refills | Status: DC
Start: 2020-05-28 — End: 2020-08-17

## 2020-05-28 MED FILL — GABAPENTIN 300 MG CAPSULE: 300 | 30 days supply | Qty: 60 | Fill #2

## 2020-05-28 NOTE — Telephone Encounter (Signed)
Requested Rx sent in  

## 2020-05-28 NOTE — Telephone Encounter (Addendum)
Caller Name: Louis Call back phone #: [phone]  MEDICATION(S): pt is not out currently but needs new RX sent to mail order pharmacy for 90 day supplys of all meds  montelukast 10mg  losartan (COZAAR) 50 MG tablet  metoprolol succinate (TOPROL-XL) 25 MG 24 hr tablet  estradiol (VIVELLE-DOT) 0.05 MG/24HR patch gabapentin (NEURONTIN) 300 MG capsule  hydrochlorothiazide (HYDRODIURIL) 12.5 MG tablet atorvastatin (LIPITOR) 10 MG tablet  metFORMIN (GLUCOPHAGE-XR) 500 MG 24 hr tablet omeprazole (PRILOSEC) 40 MG capsule   Has the patient contacted their pharmacy? yes IF YES, when and what did the pharmacy advise? Request was faxed in  Preferred Pharmacy: [facility]

## 2020-06-11 MED FILL — SHINGRIX 50 MCG SUS: 50 | 1 days supply | Qty: 1 | Fill #0

## 2020-06-18 ENCOUNTER — Ambulatory Visit: Payer: 59 | Admitting: Family Medicine

## 2020-06-23 MED FILL — FLOVENT HFA 110 MCG INHALER: 110 | 30 days supply | Qty: 12 | Fill #1

## 2020-06-25 ENCOUNTER — Ambulatory Visit: Payer: 59 | Admitting: Obstetrics & Gynecology

## 2020-06-30 ENCOUNTER — Ambulatory Visit (INDEPENDENT_AMBULATORY_CARE_PROVIDER_SITE_OTHER): Payer: 59 | Admitting: Obstetrics & Gynecology

## 2020-06-30 ENCOUNTER — Other Ambulatory Visit: Payer: Self-pay | Admitting: Family Medicine

## 2020-06-30 ENCOUNTER — Encounter: Payer: Self-pay | Admitting: Obstetrics & Gynecology

## 2020-06-30 ENCOUNTER — Other Ambulatory Visit: Payer: Self-pay

## 2020-06-30 ENCOUNTER — Other Ambulatory Visit (HOSPITAL_COMMUNITY)
Admission: RE | Admit: 2020-06-30 | Discharge: 2020-06-30 | Disposition: A | Discharge status: Home / Self Care | Payer: 59 | Source: Ambulatory Visit | Attending: Obstetrics & Gynecology | Admitting: Obstetrics & Gynecology

## 2020-06-30 VITALS — BP 135/88 | HR 91 | Ht 62.0 in | Wt 228.0 lb

## 2020-06-30 DIAGNOSIS — J4530 Mild persistent asthma, uncomplicated: Secondary | ICD-10-CM

## 2020-06-30 DIAGNOSIS — J301 Allergic rhinitis due to pollen: Secondary | ICD-10-CM

## 2020-06-30 DIAGNOSIS — N898 Other specified noninflammatory disorders of vagina: Secondary | ICD-10-CM

## 2020-06-30 NOTE — Progress Notes (Signed)
History:  60 y.o. G1P0010 here today for eval of vaginal irritation. Pt reports that she has started exercising and she has started having some itching and discomfort and 'irritation'. She denies bleeding or discharge. Pt husband has changed jobs and does not work at [employer] but, works for a [employer].   The following portions of the patient's history were reviewed and updated as appropriate: allergies, current medications, past family history, past medical history, past social history, past surgical history and problem list.  Review of Systems:  Pertinent items are noted in HPI.    Objective:  Physical Exam Blood pressure 135/88, pulse 91, height 5\' 2"  (1.575 m), weight 228 lb (103.4 kg).  CONSTITUTIONAL: Well-developed, well-nourished female in no acute distress.  HENT:  Normocephalic, atraumatic EYES: Conjunctivae and EOM are normal. No scleral icterus.  NECK: Normal range of motion SKIN: Skin is warm and dry. No rash noted. Not diaphoretic.No pallor. [place]: Alert and oriented to person, place, and time. Normal coordination.  Pelvic: Normal appearing external genitalia- there are hyperpigmented areas that are consistent with prev exams.  There appears to be some edema of the left labia majus. There are no excoriations or lesions noted; normal appearing vaginal mucosa.  Normal discharge.    Assessment & Plan:  Vaginal irritation. Pt with a h/o VIN    Affirm test sent to eval for Yeast F/u in 3 months If neg restart Clobetasol   Dynasia Kercheval L. Harraway-Smith, M.D., [name]

## 2020-07-01 LAB — CERVICOVAGINAL ANCILLARY ONLY
Bacterial Vaginitis (gardnerella): NEGATIVE
Candida Glabrata: NEGATIVE
Candida Vaginitis: NEGATIVE
Comment: NEGATIVE
Comment: NEGATIVE
Comment: NEGATIVE

## 2020-07-06 ENCOUNTER — Other Ambulatory Visit: Payer: Self-pay | Admitting: Family Medicine

## 2020-07-06 DIAGNOSIS — E1165 Type 2 diabetes mellitus with hyperglycemia: Secondary | ICD-10-CM

## 2020-07-06 NOTE — Telephone Encounter (Signed)
Caller Name: Vaughan Basta w/[facility] Call back phone #: [phone]  MEDICATION(S): metformin and hctz pt is out of both medications

## 2020-07-15 ENCOUNTER — Ambulatory Visit: Payer: 59 | Admitting: Family Medicine

## 2020-08-10 ENCOUNTER — Other Ambulatory Visit (HOSPITAL_BASED_OUTPATIENT_CLINIC_OR_DEPARTMENT_OTHER): Payer: Self-pay | Admitting: Internal Medicine

## 2020-08-10 MED FILL — SHINGRIX 50 MCG SUS: 50 | 1 days supply | Qty: 1 | Fill #0

## 2020-08-17 ENCOUNTER — Other Ambulatory Visit: Payer: Self-pay | Admitting: Family Medicine

## 2020-08-22 ENCOUNTER — Other Ambulatory Visit: Payer: Self-pay | Admitting: Family Medicine

## 2020-08-25 ENCOUNTER — Other Ambulatory Visit (HOSPITAL_BASED_OUTPATIENT_CLINIC_OR_DEPARTMENT_OTHER): Payer: Self-pay

## 2020-08-25 MED ORDER — FLUTICASONE PROPIONATE HFA 110 MCG/ACT IN AERO
INHALATION_SPRAY | RESPIRATORY_TRACT | 3 refills | Status: DC
  Filled 2020-08-25: qty 12, 30d supply, fill #0
  Filled 2020-10-20: qty 12, 30d supply, fill #1
  Filled 2020-12-03: qty 12, 30d supply, fill #2

## 2020-08-27 ENCOUNTER — Other Ambulatory Visit (HOSPITAL_BASED_OUTPATIENT_CLINIC_OR_DEPARTMENT_OTHER): Payer: Self-pay

## 2020-08-27 ENCOUNTER — Encounter: Payer: Self-pay | Admitting: Family Medicine

## 2020-08-27 ENCOUNTER — Other Ambulatory Visit: Payer: Self-pay

## 2020-08-27 ENCOUNTER — Telehealth: Payer: Self-pay | Admitting: Family Medicine

## 2020-08-27 MED ORDER — GABAPENTIN 300 MG PO CAPS
300.0000 mg | ORAL_CAPSULE | Freq: Two times a day (BID) | ORAL | 3 refills | Status: DC
  Filled 2020-08-27: qty 60, 30d supply, fill #0

## 2020-08-27 MED ORDER — GABAPENTIN 300 MG PO CAPS: 300.0000 mg | ORAL_CAPSULE | Freq: Two times a day (BID) | ORAL | 3 refills | Status: DC

## 2020-08-27 NOTE — Telephone Encounter (Signed)
The patient is completely out of the medication according to [facility] but they said the refill would not get to her in time so she prefers it to go to her local pharmacy  1.Medication Requested: gabapentin (NEURONTIN) 300 MG capsule  2. Pharmacy (Name, Street, [place]):  [place], [address], [address]  3. On Med List: yes  4. Last Visit with PCP: 9.7.21  5. Next visit date with PCP: 4.12.22   Agent: Please be advised that RX refills may take up to 3 business days. We ask that you follow-up with your pharmacy.

## 2020-08-27 NOTE — Telephone Encounter (Signed)
Refill request for Gabapentin 300mg  last OV September 2021. Patient has upcoming visit next week for follow up, completley out of medications today. Please advise.

## 2020-08-27 NOTE — Telephone Encounter (Signed)
Okay to fill? 

## 2020-08-30 ENCOUNTER — Other Ambulatory Visit: Payer: Self-pay

## 2020-08-31 ENCOUNTER — Encounter: Payer: Self-pay | Admitting: Family Medicine

## 2020-08-31 ENCOUNTER — Ambulatory Visit (INDEPENDENT_AMBULATORY_CARE_PROVIDER_SITE_OTHER): Payer: 59 | Admitting: Family Medicine

## 2020-08-31 VITALS — BP 138/82 | HR 78 | Temp 97.6°F | Ht 62.0 in | Wt 220.4 lb

## 2020-08-31 DIAGNOSIS — E7800 Pure hypercholesterolemia, unspecified: Secondary | ICD-10-CM

## 2020-08-31 DIAGNOSIS — I10 Essential (primary) hypertension: Secondary | ICD-10-CM

## 2020-08-31 DIAGNOSIS — R1013 Epigastric pain: Secondary | ICD-10-CM

## 2020-08-31 DIAGNOSIS — E1165 Type 2 diabetes mellitus with hyperglycemia: Secondary | ICD-10-CM | POA: Diagnosis not present

## 2020-08-31 DIAGNOSIS — K2970 Gastritis, unspecified, without bleeding: Secondary | ICD-10-CM

## 2020-08-31 DIAGNOSIS — K760 Fatty (change of) liver, not elsewhere classified: Secondary | ICD-10-CM

## 2020-08-31 LAB — URINALYSIS, ROUTINE W REFLEX MICROSCOPIC
Bilirubin Urine: NEGATIVE
Hgb urine dipstick: NEGATIVE
Ketones, ur: NEGATIVE
Nitrite: NEGATIVE
RBC / HPF: NONE SEEN (ref 0–?)
Specific Gravity, Urine: 1.015 (ref 1.000–1.030)
Total Protein, Urine: NEGATIVE
Urine Glucose: NEGATIVE
Urobilinogen, UA: 0.2 (ref 0.0–1.0)
pH: 5.5 (ref 5.0–8.0)

## 2020-08-31 LAB — COMPREHENSIVE METABOLIC PANEL
ALT: 23 U/L (ref 0–35)
AST: 16 U/L (ref 0–37)
Albumin: 4.2 g/dL (ref 3.5–5.2)
Alkaline Phosphatase: 74 U/L (ref 39–117)
BUN: 10 mg/dL (ref 6–23)
CO2: 28 mEq/L (ref 19–32)
Calcium: 9.9 mg/dL (ref 8.4–10.5)
Chloride: 101 mEq/L (ref 96–112)
Creatinine, Ser: 0.86 mg/dL (ref 0.40–1.20)
GFR: 73.28 mL/min (ref >60.00)
Glucose, Bld: 114 mg/dL — ABNORMAL HIGH (ref 70–99)
Potassium: 3.8 mEq/L (ref 3.5–5.1)
Sodium: 139 mEq/L (ref 135–145)
Total Bilirubin: 0.5 mg/dL (ref 0.2–1.2)
Total Protein: 7.6 g/dL (ref 6.0–8.3)

## 2020-08-31 LAB — CBC
HCT: 40.6 % (ref 36.0–46.0)
Hemoglobin: 13.4 g/dL (ref 12.0–15.0)
MCHC: 33 g/dL (ref 30.0–36.0)
MCV: 87.1 fl (ref 78.0–100.0)
Platelets: 341 10*3/uL (ref 150.0–400.0)
RBC: 4.67 Mil/uL (ref 3.87–5.11)
RDW: 14.1 % (ref 11.5–15.5)
WBC: 4.6 10*3/uL (ref 4.0–10.5)

## 2020-08-31 LAB — LIPASE: Lipase: 15 U/L (ref 11.0–59.0)

## 2020-08-31 LAB — LIPID PANEL
Cholesterol: 137 mg/dL (ref 0–200)
HDL: 43.2 mg/dL (ref >39.00)
LDL Cholesterol: 76 mg/dL (ref 0–99)
NonHDL: 94.17
Total CHOL/HDL Ratio: 3
Triglycerides: 92 mg/dL (ref 0.0–149.0)
VLDL: 18.4 mg/dL (ref 0.0–40.0)

## 2020-08-31 LAB — MICROALBUMIN / CREATININE URINE RATIO
Creatinine,U: 127.5 mg/dL
Microalb Creat Ratio: 0.8 mg/g (ref 0.0–30.0)
Microalb, Ur: 1.1 mg/dL (ref 0.0–1.9)

## 2020-08-31 LAB — AMYLASE: Amylase: 28 U/L (ref 27–131)

## 2020-08-31 LAB — HEMOGLOBIN A1C: Hgb A1c MFr Bld: 7.5 % — ABNORMAL HIGH (ref 4.6–6.5)

## 2020-08-31 MED ORDER — PANTOPRAZOLE SODIUM 40 MG PO TBEC: 40.0000 mg | DELAYED_RELEASE_TABLET | Freq: Every day | ORAL | 3 refills | Status: DC

## 2020-08-31 NOTE — Patient Instructions (Signed)
Pantoprazole tablets What is this medicine? PANTOPRAZOLE (pan TOE pra zole) prevents the production of acid in the stomach. It is used to treat gastroesophageal reflux disease (GERD), inflammation of the esophagus, and Zollinger-Ellison syndrome. This medicine may be used for other purposes; ask your health care provider or pharmacist if you have questions. COMMON BRAND NAME(S): Protonix What should I tell my health care provider before I take this medicine? They need to know if you have any of these conditions:  liver disease  low levels of magnesium in the blood  lupus  an unusual or allergic reaction to omeprazole, lansoprazole, pantoprazole, rabeprazole, other medicines, foods, dyes, or preservatives  pregnant or trying to get pregnant  breast-feeding How should I use this medicine? Take this medicine by mouth. Swallow the tablets whole with a drink of water. Follow the directions on the prescription label. Do not crush, break, or chew. Take your medicine at regular intervals. Do not take your medicine more often than directed. Talk to your pediatrician regarding the use of this medicine in children. While this drug may be prescribed for children as young as 5 years for selected conditions, precautions do apply. Overdosage: If you think you have taken too much of this medicine contact a poison control center or emergency room at once. NOTE: This medicine is only for you. Do not share this medicine with others. What if I miss a dose? If you miss a dose, take it as soon as you can. If it is almost time for your next dose, take only that dose. Do not take double or extra doses. What may interact with this medicine? Do not take this medicine with any of the following medications:  atazanavir  nelfinavir This medicine may also interact with the following medications:  ampicillin  delavirdine  erlotinib  iron salts  medicines for fungal infections like ketoconazole,  itraconazole and voriconazole  methotrexate  mycophenolate mofetil  warfarin This list may not describe all possible interactions. Give your health care provider a list of all the medicines, herbs, non-prescription drugs, or dietary supplements you use. Also tell them if you smoke, drink alcohol, or use illegal drugs. Some items may interact with your medicine. What should I watch for while using this medicine? It can take several days before your stomach pain gets better. Check with your doctor or health care provider if your condition does not start to get better, or if it gets worse. This medicine may cause serious skin reactions. They can happen weeks to months after starting the medicine. Contact your health care provider right away if you notice fevers or flu-like symptoms with a rash. The rash may be red or purple and then turn into blisters or peeling of the skin. Or, you might notice a red rash with swelling of the face, lips or lymph nodes in your neck or under your arms. You may need blood work done while you are taking this medicine. This medicine may cause a decrease in vitamin B12. You should make sure that you get enough vitamin B12 while you are taking this medicine. Discuss the foods you eat and the vitamins you take with your health care provider. What side effects may I notice from receiving this medicine? Side effects that you should report to your doctor or health care professional as soon as possible:   allergic reactions like skin rash, itching or hives, swelling of the face, lips, or tongue   bone, muscle or joint pain   breathing problems  chest pain or chest tightness   dark yellow or brown urine   dizziness   fast, irregular heartbeat   feeling faint or lightheaded   fever or sore throat   muscle spasm   palpitations   rash on cheeks or arms that gets worse in the sun   redness, blistering, peeling or loosening of the skin, including  inside the mouth   seizures  stomach polyps   tremors   unusual bleeding or bruising   unusually weak or tired   yellowing of the eyes or skin Side effects that usually do not require medical attention (report to your doctor or health care professional if they continue or are bothersome):   constipation   diarrhea   dry mouth   headache   nausea This list may not describe all possible side effects. Call your doctor for medical advice about side effects. You may report side effects to FDA at 1-800-FDA-1088. Where should I keep my medicine? Keep out of the reach of children. Store at room temperature between 15 and 30 degrees C (59 and 86 degrees F). Protect from light and moisture. Throw away any unused medicine after the expiration date. NOTE: This sheet is a summary. It may not cover all possible information. If you have questions about this medicine, talk to your doctor, pharmacist, or health care provider.  2021 Elsevier/Gold Standard (2018-08-16 13:18:32) Semaglutide injection solution What is this medicine? SEMAGLUTIDE (Sem a GLOO tide) is used to improve blood sugar control in adults with type 2 diabetes. This medicine may be used with other diabetes medicines. This drug may also reduce the risk of heart attack or stroke if you have type 2 diabetes and risk factors for heart disease. This medicine may be used for other purposes; ask your health care provider or pharmacist if you have questions. COMMON BRAND NAME(S): OZEMPIC What should I tell my health care provider before I take this medicine? They need to know if you have any of these conditions:  endocrine tumors (MEN 2) or if someone in your family had these tumors  eye disease, vision problems  history of pancreatitis  kidney disease  stomach problems  thyroid cancer or if someone in your family had thyroid cancer  an unusual or allergic reaction to semaglutide, other medicines, foods, dyes, or  preservatives  pregnant or trying to get pregnant  breast-feeding How should I use this medicine? This medicine is for injection under the skin of your upper leg (thigh), stomach area, or upper arm. It is given once every week (every 7 days). You will be taught how to prepare and give this medicine. Use exactly as directed. Take your medicine at regular intervals. Do not take it more often than directed. If you use this medicine with insulin, you should inject this medicine and the insulin separately. Do not mix them together. Do not give the injections right next to each other. Change (rotate) injection sites with each injection. It is important that you put your used needles and syringes in a special sharps container. Do not put them in a trash can. If you do not have a sharps container, call your pharmacist or healthcare provider to get one. A special MedGuide will be given to you by the pharmacist with each prescription and refill. Be sure to read this information carefully each time. This drug comes with INSTRUCTIONS FOR USE. Ask your pharmacist for directions on how to use this drug. Read the information carefully. Talk to your  pharmacist or health care provider if you have questions. Talk to your pediatrician regarding the use of this medicine in children. Special care may be needed. Overdosage: If you think you have taken too much of this medicine contact a poison control center or emergency room at once. NOTE: This medicine is only for you. Do not share this medicine with others. What if I miss a dose? If you miss a dose, take it as soon as you can within 5 days after the missed dose. Then take your next dose at your regular weekly time. If it has been longer than 5 days after the missed dose, do not take the missed dose. Take the next dose at your regular time. Do not take double or extra doses. If you have questions about a missed dose, contact your health care provider for advice. What  may interact with this medicine?  other medicines for diabetes Many medications may cause changes in blood sugar, these include:  alcohol containing beverages  antiviral medicines for HIV or AIDS  aspirin and aspirin-like drugs  certain medicines for blood pressure, heart disease, irregular heart beat  chromium  diuretics  female hormones, such as estrogens or progestins, birth control pills  fenofibrate  gemfibrozil  isoniazid  lanreotide  female hormones or anabolic steroids  MAOIs like Carbex, Eldepryl, Marplan, Nardil, and Parnate  medicines for weight loss  medicines for allergies, asthma, cold, or cough  medicines for depression, anxiety, or psychotic disturbances  niacin  nicotine  NSAIDs, medicines for pain and inflammation, like ibuprofen or naproxen  octreotide  pasireotide  pentamidine  phenytoin  probenecid  quinolone antibiotics such as ciprofloxacin, levofloxacin, ofloxacin  some herbal dietary supplements  steroid medicines such as prednisone or cortisone  sulfamethoxazole; trimethoprim  thyroid hormones Some medications can hide the warning symptoms of low blood sugar (hypoglycemia). You may need to monitor your blood sugar more closely if you are taking one of these medications. These include:  beta-blockers, often used for high blood pressure or heart problems (examples include atenolol, metoprolol, propranolol)  clonidine  guanethidine  reserpine This list may not describe all possible interactions. Give your health care provider a list of all the medicines, herbs, non-prescription drugs, or dietary supplements you use. Also tell them if you smoke, drink alcohol, or use illegal drugs. Some items may interact with your medicine. What should I watch for while using this medicine? Visit your doctor or health care professional for regular checks on your progress. Drink plenty of fluids while taking this medicine. Check with your  doctor or health care professional if you get an attack of severe diarrhea, nausea, and vomiting. The loss of too much body fluid can make it dangerous for you to take this medicine. A test called the HbA1C (A1C) will be monitored. This is a simple blood test. It measures your blood sugar control over the last 2 to 3 months. You will receive this test every 3 to 6 months. Learn how to check your blood sugar. Learn the symptoms of low and high blood sugar and how to manage them. Always carry a quick-source of sugar with you in case you have symptoms of low blood sugar. Examples include hard sugar candy or glucose tablets. Make sure others know that you can choke if you eat or drink when you develop serious symptoms of low blood sugar, such as seizures or unconsciousness. They must get medical help at once. Tell your doctor or health care professional if you have  high blood sugar. You might need to change the dose of your medicine. If you are sick or exercising more than usual, you might need to change the dose of your medicine. Do not skip meals. Ask your doctor or health care professional if you should avoid alcohol. Many nonprescription cough and cold products contain sugar or alcohol. These can affect blood sugar. Pens should never be shared. Even if the needle is changed, sharing may result in passing of viruses like hepatitis or HIV. Wear a medical ID bracelet or chain, and carry a card that describes your disease and details of your medicine and dosage times. Do not become pregnant while taking this medicine. Women should inform their doctor if they wish to become pregnant or think they might be pregnant. There is a potential for serious side effects to an unborn child. Talk to your health care professional or pharmacist for more information. What side effects may I notice from receiving this medicine? Side effects that you should report to your doctor or health care professional as soon as  possible:  allergic reactions like skin rash, itching or hives, swelling of the face, lips, or tongue  breathing problems  changes in vision  diarrhea that continues or is severe  lump or swelling on the neck  severe nausea  signs and symptoms of infection like fever or chills; cough; sore throat; pain or trouble passing urine  signs and symptoms of low blood sugar such as feeling anxious, confusion, dizziness, increased hunger, unusually weak or tired, sweating, shakiness, cold, irritable, headache, blurred vision, fast heartbeat, loss of consciousness  signs and symptoms of kidney injury like trouble passing urine or change in the amount of urine  trouble swallowing  unusual stomach upset or pain  vomiting Side effects that usually do not require medical attention (report to your doctor or health care professional if they continue or are bothersome):  constipation  diarrhea  nausea  pain, redness, or irritation at site where injected  stomach upset This list may not describe all possible side effects. Call your doctor for medical advice about side effects. You may report side effects to FDA at 1-800-FDA-1088. Where should I keep my medicine? Keep out of the reach of children. Store unopened pens in a refrigerator between 2 and 8 degrees C (36 and 46 degrees F). Do not freeze. Protect from light and heat. After you first use the pen, it can be stored for 56 days at room temperature between 15 and 30 degrees C (59 and 86 degrees F) or in a refrigerator. Throw away your used pen after 56 days or after the expiration date, whichever comes first. Do not store your pen with the needle attached. If the needle is left on, medicine may leak from the pen. NOTE: This sheet is a summary. It may not cover all possible information. If you have questions about this medicine, talk to your doctor, pharmacist, or health care provider.  2021 Elsevier/Gold Standard (2019-01-21 09:41:51)

## 2020-08-31 NOTE — Progress Notes (Signed)
Established Patient Office Visit  Subjective:  Patient ID: Grace Gregory, female    DOB: [birth year]  Age: 60 y.o. MRN: [id number]  CC:  Chief Complaint  Patient presents with  . Follow-up    Follow up no concerns. Patient fasting for labs.     HPI Grace Gregory presents for follow-up of hypertension, diabetes, elevated cholesterol.  Blood pressures been controlled with HCTZ, losartan, metoprolol.  She has tolerated the increased dose of Metformin to 1000 mg twice daily with minimal side effect.  She is tolerating atorvastatin at low-dose 10 mg.  Has been exercising and been able to lose some 12 pounds since her last visit.  EGD did show gastritis.  She continues with midepigastric pain on 40 mg of Prilosec.  Past Medical History:  Diagnosis Date  . Arthritis   . Asthma   . Diabetes ([place])   . Elevated cholesterol   . GERD (gastroesophageal reflux disease)   . Hypertension   . Obesity   . Seasonal allergies   . Sleep apnea    On CPAP Machine     Past Surgical History:  Procedure Laterality Date  . ABDOMINAL HYSTERECTOMY    . BACK SURGERY    . COLONOSCOPY     age 82 in [place]. Normal   . VULVECTOMY N/A 08/06/2018   Procedure: WIDE LOCAL EXCISION VULVA;  Surgeon: Everitt Amber, MD;  Location: [facility];  Service: Gynecology;  Laterality: N/A;    Family History  Problem Relation Age of Onset  . Hypertension Mother   . Heart disease Father   . Hypertension Sister   . Hypertension Brother   . Hypertension Sister   . Hypertension Sister   . Brain cancer Brother   . Hypertension Brother   . Colon cancer Neg Hx   . Esophageal cancer Neg Hx   . Stomach cancer Neg Hx   . Rectal cancer Neg Hx     Social History   Socioeconomic History  . Marital status: Married    Spouse name: Not on file  . Number of children: Not on file  . Years of education: Not on file  . Highest education level: Not on file  Occupational History  . Not on  file  Tobacco Use  . Smoking status: Never Smoker  . Smokeless tobacco: Never Used  Vaping Use  . Vaping Use: Never used  Substance and Sexual Activity  . Alcohol use: Yes    Comment: occassionally   . Drug use: Never  . Sexual activity: Yes    Birth control/protection: None  Other Topics Concern  . Not on file  Social History Narrative  . Not on file   Social Determinants of Health   Financial Resource Strain: Not on file  Food Insecurity: Not on file  Transportation Needs: Not on file  Physical Activity: Not on file  Stress: Not on file  Social Connections: Not on file  Intimate Partner Violence: Not on file    Outpatient Medications Prior to Visit  Medication Sig Dispense Refill  . AMBULATORY NON FORMULARY MEDICATION Medication Name: Allergy shot x2 a week    . atorvastatin (LIPITOR) 10 MG tablet Take 1 tablet (10 mg total) by mouth daily. 90 tablet 3  . AUVI-Q 0.3 MG/0.3ML SOAJ injection     . Blood Glucose Monitoring Suppl (FREESTYLE LITE) DEVI Use to test blood sugars 1-2 times daily. 1 each 0  . cetirizine (ZYRTEC) 10 MG tablet Take 10 mg  by mouth daily.    . clobetasol cream (TEMOVATE) 1.51 % Apply 1 application topically 2 (two) times daily. Not for face or private areas. 60 g 0  . estradiol (VIVELLE-DOT) 0.05 MG/24HR patch Place 1 patch (0.05 mg total) onto the skin 2 (two) times a week. 8 patch 12  . fluticasone (FLOVENT HFA) 110 MCG/ACT inhaler INHALE 2 PUFFS BY MOUTH TWICE A DAY 12 g 3  . gabapentin (NEURONTIN) 300 MG capsule Take 1 capsule (300 mg total) by mouth 2 (two) times daily. 60 capsule 3  . glucose blood (FREESTYLE LITE) test strip Use to test blood sugars 1-2 times daily. 100 each 12  . hydrochlorothiazide (HYDRODIURIL) 12.5 MG tablet TAKE 1 TABLET BY MOUTH  DAILY 90 tablet 1  . Lancets (FREESTYLE) lancets Use to test blood sugars 1-2 times daily. 100 each 12  . losartan (COZAAR) 50 MG tablet TAKE 1 TABLET BY MOUTH  DAILY 90 tablet 3  . metFORMIN  (GLUCOPHAGE-XR) 500 MG 24 hr tablet TAKE 2 TABLETS BY MOUTH  TWICE DAILY 360 tablet 1  . metoprolol succinate (TOPROL-XL) 25 MG 24 hr tablet     . mometasone (NASONEX) 50 MCG/ACT nasal spray PLACE 1 SPRAY IN EACH NOSTRIL. MAY INCREASE TO 2 SPRAYS IN EACH NOSTRIL AS TOLERATED 17 g 2  . montelukast (SINGULAIR) 10 MG tablet Take 1 tablet (10 mg total) by mouth daily. 90 tablet 2  . PROAIR HFA 108 (90 Base) MCG/ACT inhaler 1 puff as needed. Use as directed    . omeprazole (PRILOSEC) 40 MG capsule Take 1 capsule (40 mg total) by mouth daily. 90 capsule 2  . azelastine (ASTELIN) 0.1 % nasal spray Place into both nostrils 2 (two) times daily. Use in each nostril as directed (Patient not taking: Reported on 08/31/2020)    . Azelastine HCl 137 MCG/SPRAY SOLN PLACE 1 TO 2 SPRAYS IN EACH NOSTRIL TWICE A DAY (Patient not taking: Reported on 08/31/2020) 30 mL 2  . budesonide-formoterol (SYMBICORT) 160-4.5 MCG/ACT inhaler Inhale 2 puffs into the lungs 2 (two) times daily. (Patient not taking: Reported on 08/31/2020)    . COVID-19 mRNA vaccine, Pfizer, 30 MCG/0.3ML injection INJECT AS DIRECTED (Patient not taking: Reported on 08/31/2020) .3 mL 0  . losartan (COZAAR) 50 MG tablet  (Patient not taking: Reported on 08/31/2020)    . methylPREDNISolone (MEDROL DOSEPAK) 4 MG TBPK tablet FOLLOW DIRECTIONS ON DOSEPAK (Patient not taking: Reported on 08/31/2020) 21 each 0  . metoprolol succinate (TOPROL-XL) 25 MG 24 hr tablet TAKE 1 TABLET BY MOUTH  DAILY (Patient not taking: Reported on 08/31/2020) 90 tablet 3  . mometasone (NASONEX) 50 MCG/ACT nasal spray 2 sprays in each nostril (Patient not taking: Reported on 08/31/2020)    . Zoster Vaccine Adjuvanted [facility]) injection INJECT AS DIRECTED (Patient not taking: Reported on 08/31/2020) 1 each 0   No facility-administered medications prior to visit.    Allergies  Allergen Reactions  . Lisinopril Cough  . Other Rash  . Penicillins Rash    ROS Review of Systems     Objective:    Physical Exam  BP 138/82   Pulse 78   Temp 97.6 F (36.4 C) (Temporal)   Ht 5\' 2"  (1.575 m)   Wt 220 lb 6.4 oz (100 kg)   LMP  (LMP Unknown)   SpO2 96%   BMI 40.31 kg/m  Wt Readings from Last 3 Encounters:  08/31/20 220 lb 6.4 oz (100 kg)  06/30/20 228 lb (103.4 kg)  04/21/20  231 lb (104.8 kg)     Health Maintenance Due  Topic Date Due  . Hepatitis C Screening  Never done  . PNEUMOCOCCAL POLYSACCHARIDE VACCINE AGE 52-64 HIGH RISK  Never done  . TETANUS/TDAP  12/21/2019  . HEMOGLOBIN A1C  07/27/2020    There are no preventive care reminders to display for this patient.  Lab Results  Component Value Date   TSH 3.09 12/31/2018   Lab Results  Component Value Date   WBC 6.4 01/28/2020   HGB 13.1 01/28/2020   HCT 40.4 01/28/2020   MCV 87.9 01/28/2020   PLT 314.0 01/28/2020   Lab Results  Component Value Date   NA 137 01/28/2020   K 4.0 01/28/2020   CO2 28 01/28/2020   GLUCOSE 187 (H) 01/28/2020   BUN 14 01/28/2020   CREATININE 0.81 01/28/2020   BILITOT 0.3 01/28/2020   ALKPHOS 75 01/28/2020   AST 15 01/28/2020   ALT 22 01/28/2020   PROT 7.0 01/28/2020   ALBUMIN 4.1 01/28/2020   CALCIUM 9.8 01/28/2020   ANIONGAP 10 03/07/2019   GFR 87.24 01/28/2020   Lab Results  Component Value Date   CHOL 208 (H) 01/28/2020   Lab Results  Component Value Date   HDL 47.90 01/28/2020   Lab Results  Component Value Date   LDLCALC 127 (H) 01/28/2020   Lab Results  Component Value Date   TRIG 167.0 (H) 01/28/2020   Lab Results  Component Value Date   CHOLHDL 4 01/28/2020   Lab Results  Component Value Date   HGBA1C 8.7 (H) 01/28/2020      Assessment & Plan:   Problem List Items Addressed This Visit      Cardiovascular and Mediastinum   Essential hypertension   Relevant Medications   metoprolol succinate (TOPROL-XL) 25 MG 24 hr tablet   Other Relevant Orders   CBC   Comprehensive metabolic panel   Urinalysis, Routine w reflex  microscopic   Microalbumin / creatinine urine ratio     Endocrine   Type 2 diabetes mellitus with hyperglycemia, without long-term current use of insulin (HCC) - Primary   Relevant Orders   Comprehensive metabolic panel   Hemoglobin A1c   Urinalysis, Routine w reflex microscopic   Microalbumin / creatinine urine ratio     Other   Epigastric pain   Relevant Medications   pantoprazole (PROTONIX) 40 MG tablet   Other Relevant Orders   Amylase   Lipase    Other Visit Diagnoses    NAFL (nonalcoholic fatty liver)       Relevant Orders   Comprehensive metabolic panel   Lipid panel   Elevated LDL cholesterol level       Relevant Orders   Comprehensive metabolic panel   Lipid panel   Gastritis, presence of bleeding unspecified, unspecified chronicity, unspecified gastritis type       Relevant Medications   pantoprazole (PROTONIX) 40 MG tablet      Meds ordered this encounter  Medications  . pantoprazole (PROTONIX) 40 MG tablet    Sig: Take 1 tablet (40 mg total) by mouth daily.    Dispense:  30 tablet    Refill:  3    Follow-up: Return in about 3 months (around 11/30/2020), or stop prilosec.   Believe that diabetes will be well controlled between her weight loss and higher dose of Metformin.  We discussed using Ozempic to also help with weight loss.  She will continue going to the gym and exercising and pursuing  more weight loss.  Have discontinued Prilosec in favor of Protonix for midepigastric pain.  Gastritis was diagnosed with endoscopy.  Burtis Junes this being the cause of her midepigastric pain.  If Protonix does not help may consider more advanced imaging.  Was given information on Protonix as well as Ozempic. Libby Maw, MD

## 2020-09-16 ENCOUNTER — Other Ambulatory Visit (HOSPITAL_BASED_OUTPATIENT_CLINIC_OR_DEPARTMENT_OTHER): Payer: Self-pay

## 2020-09-16 MED ORDER — ALBUTEROL SULFATE HFA 108 (90 BASE) MCG/ACT IN AERS
INHALATION_SPRAY | RESPIRATORY_TRACT | 0 refills | Status: DC
  Filled 2020-09-16: qty 8.5, 25d supply, fill #0

## 2020-09-20 ENCOUNTER — Ambulatory Visit: Payer: 59 | Admitting: Obstetrics & Gynecology

## 2020-10-08 ENCOUNTER — Other Ambulatory Visit: Payer: Self-pay | Admitting: Family Medicine

## 2020-10-08 DIAGNOSIS — R1013 Epigastric pain: Secondary | ICD-10-CM

## 2020-10-08 DIAGNOSIS — K2970 Gastritis, unspecified, without bleeding: Secondary | ICD-10-CM

## 2020-10-13 IMAGING — CT CT ANGIO HEAD
3 of 9 series · 16 of 47 positions shown · IV contrast (omnipaque)
Comparison: None.

CLINICAL DATA: Severe acute headache. Worst headache of life.
Duration of symptoms 10 days.

EXAM:
CT ANGIOGRAPHY HEAD
TECHNIQUE: Multidetector CT imaging of the head was performed using the
standard protocol during bolus administration of intravenous
contrast. Multiplanar CT image reconstructions and MIPs were
obtained to evaluate the vascular anatomy.
CONTRAST:  80mL OMNIPAQUE IOHEXOL 350 MG/ML SOLN

[Series 14: ax thin · axial · 0.38mm/px · z∈[-207,-54]mm · 10 of 181 slices shown]
[im 14/181  brain]
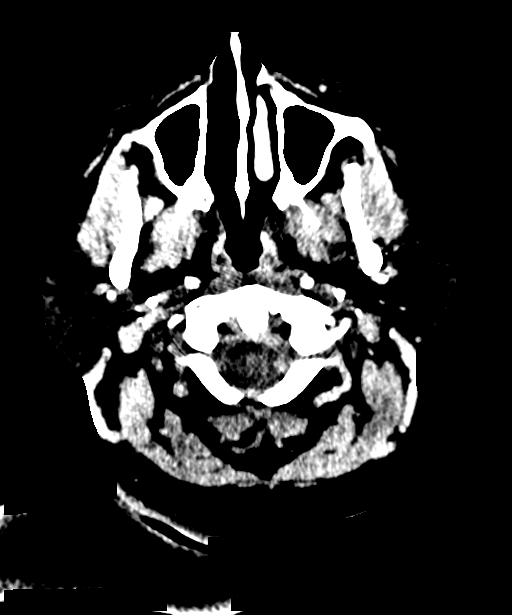
[im 28/181  bone]
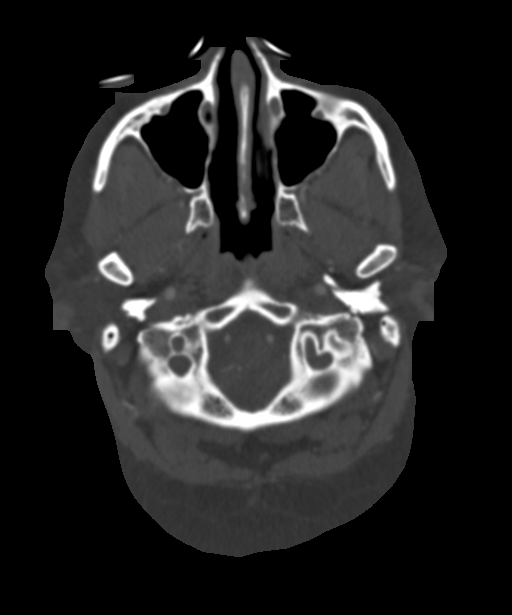
[im 56/181  brain]
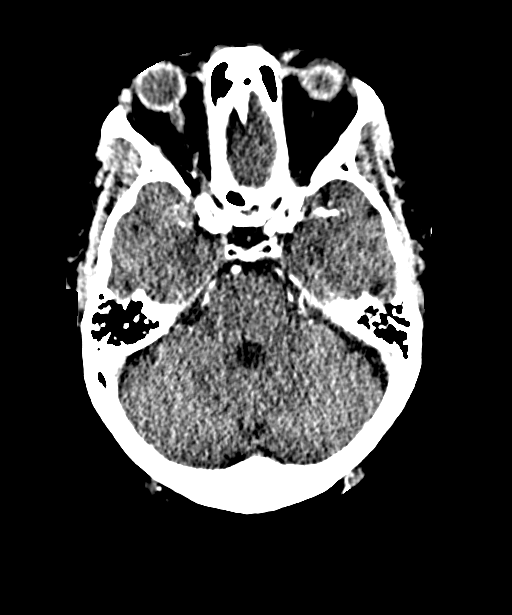
[im 70/181  bone]
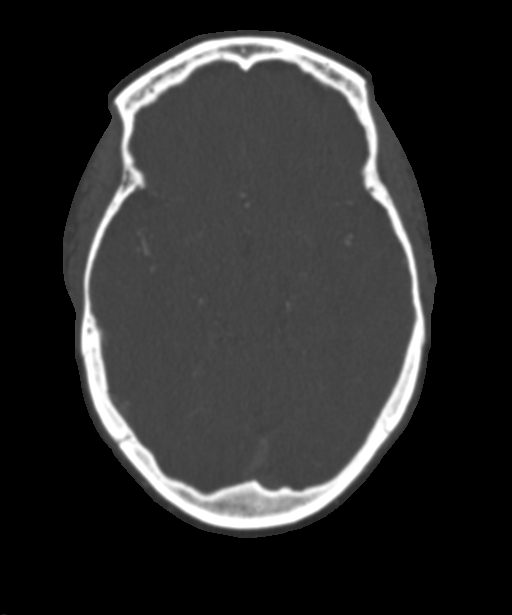
[im 84/181  brain]
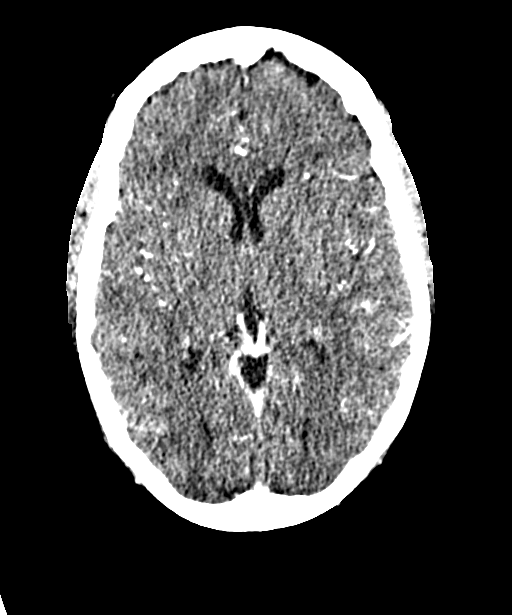
[im 97/181  bone]
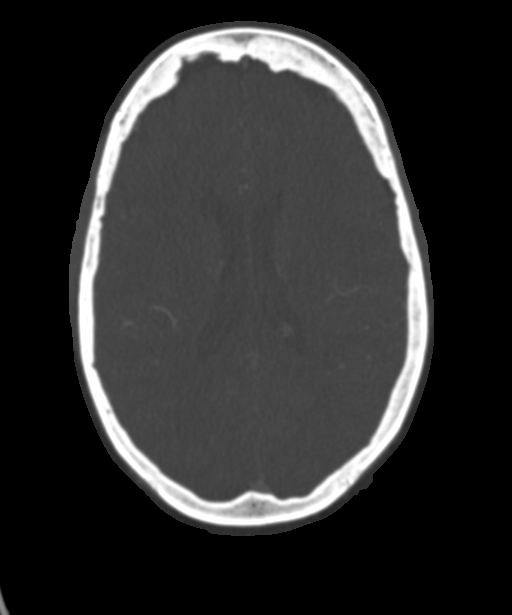
[im 111/181  brain]
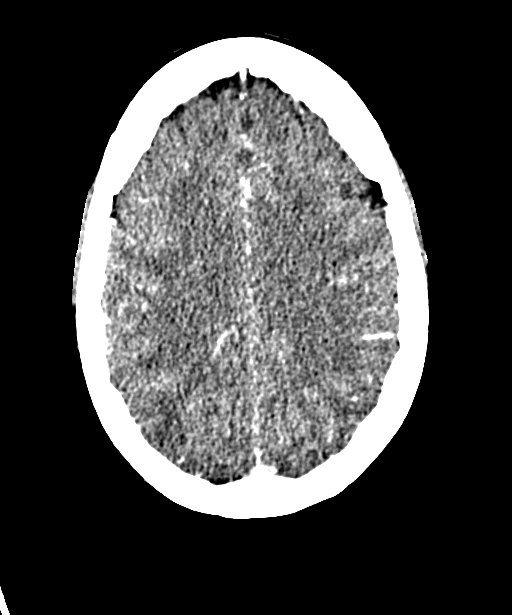
[im 139/181  bone]
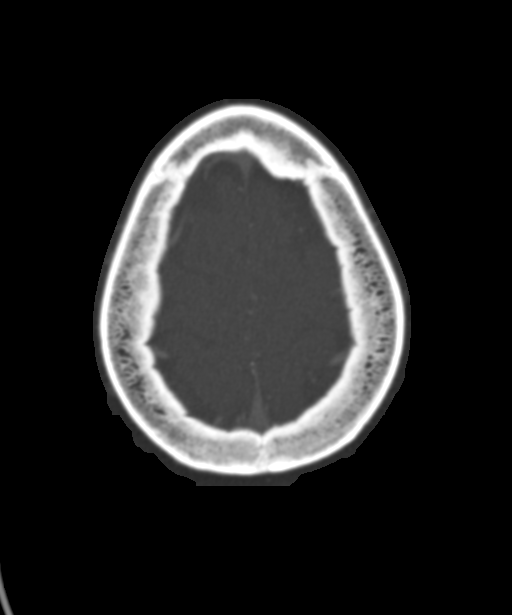
[im 153/181  brain]
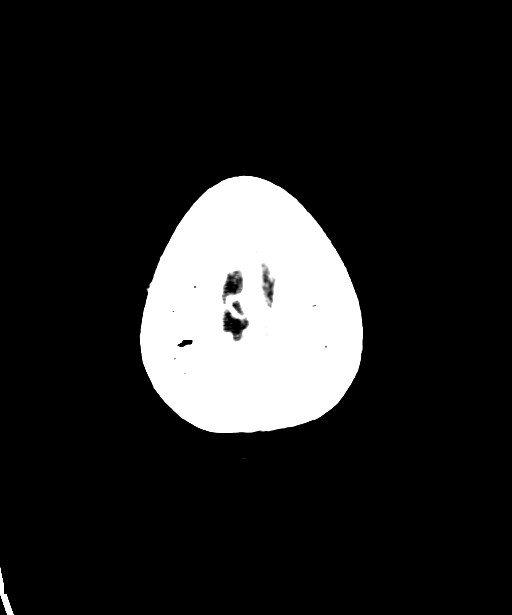
[im 167/181  bone]
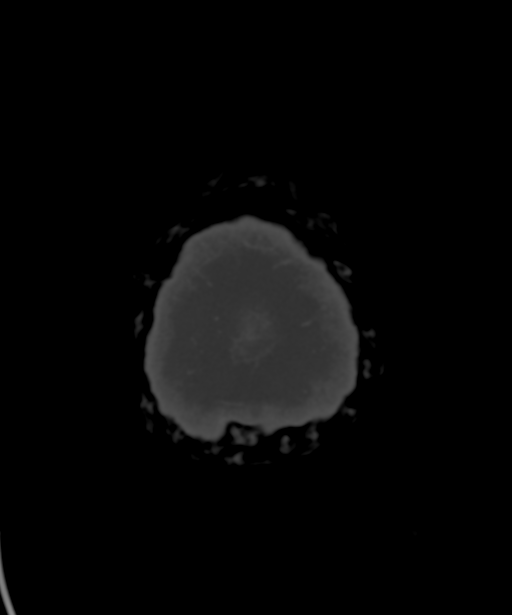

[Series 16: cor thin · coronal · 0.36mm/px · 3 of 212 slices shown]
[im 61/212  brain]
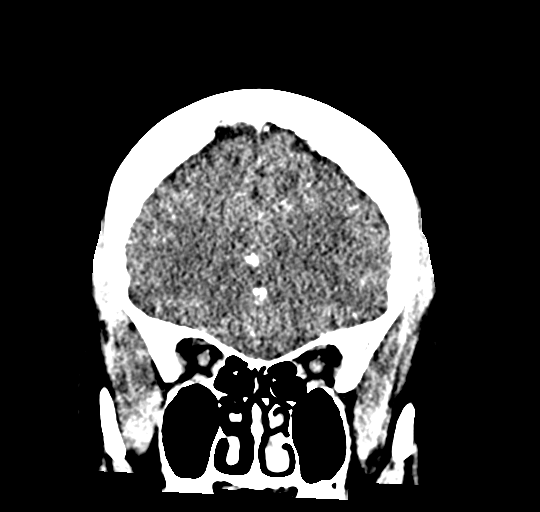
[im 91/212  brain]
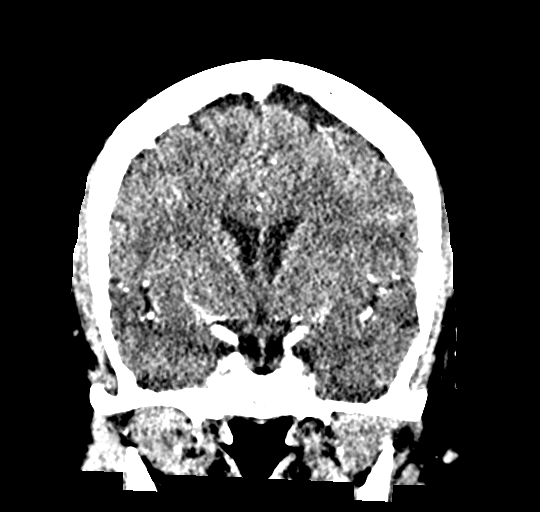
[im 121/212  brain]
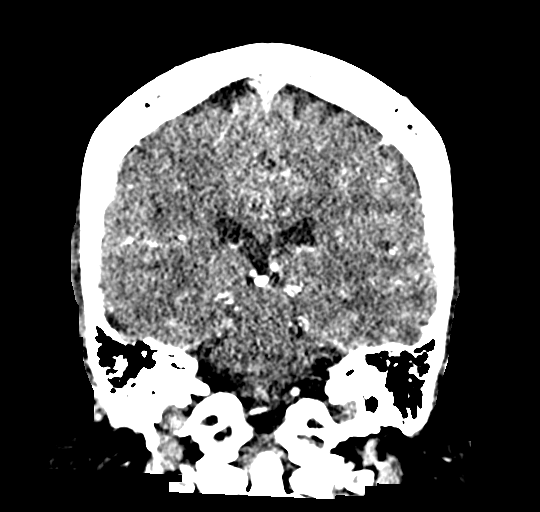

[Series 18: sag thin · sagittal · 0.36mm/px · 3 of 171 slices shown]
[im 35/171  brain]
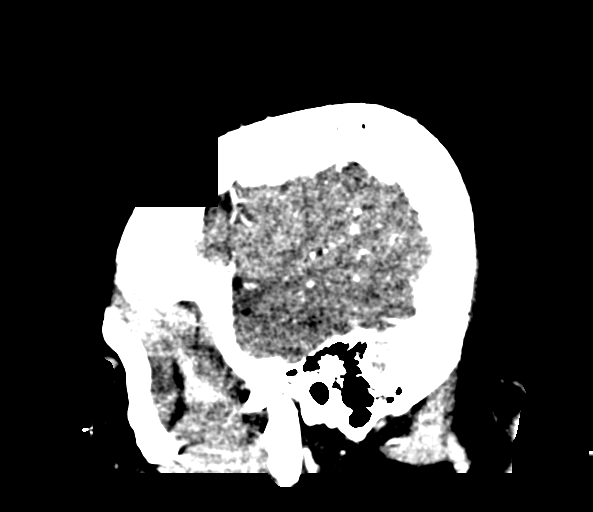
[im 69/171  brain]
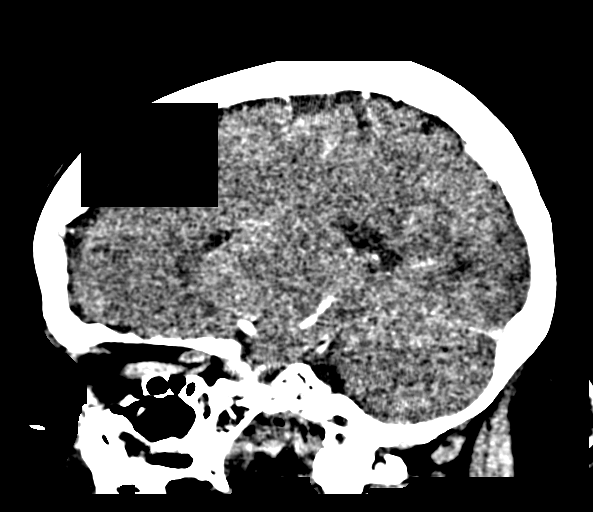
[im 103/171  brain]
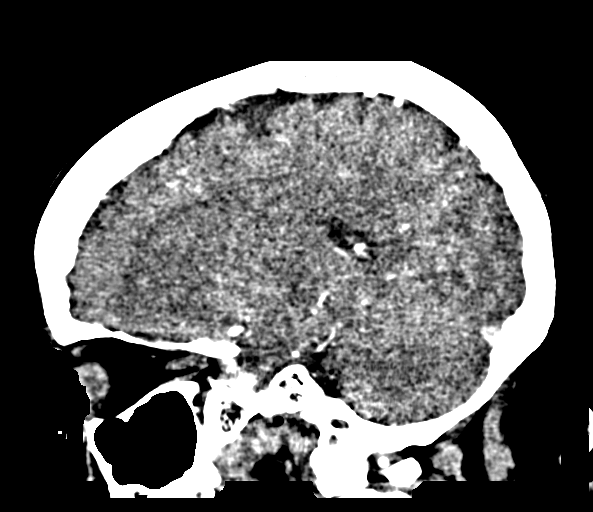

[16 of 47 positions shown; findings below may reference images not displayed]

FINDINGS: CT HEAD

Brain: No accelerated generalized atrophy. Widespread patchy
low-density within the cerebral hemispheric deep and subcortical
white matter most consistent with chronic small vessel disease, in a
person with a history of hypertension. No large vessel territory
infarction. No mass lesion, hemorrhage, hydrocephalus or extra-axial
collection

Vascular: No acute vascular finding.

Skull: Normal

Sinuses: Clear

Orbits: Normal

CTA HEAD

Anterior circulation: Both internal carotid arteries are patent
through the skull base and siphon regions. Ordinary atherosclerotic
calcification in the carotid siphons but without stenosis greater
than 30%. The anterior and middle cerebral vessels are patent
without proximal stenosis, aneurysm or vascular malformation. Distal
vessels do show some atherosclerotic irregularity.

Posterior circulation: Both vertebral arteries are patent to the
basilar. No basilar stenosis. Posterior circulation branch vessels
are patent. Distal vessel atherosclerotic irregularity particularly
in the PCA branches.

Venous sinuses: Patent and normal.

Anatomic variants: None significant.
IMPRESSION: 1. No acute finding by CT. Extensive chronic small vessel ischemic
changes throughout the cerebral hemispheric white matter, in a
person with a history of hypertension.
2. No intracranial large or medium vessel occlusion or correctable
proximal stenosis. Distal vessel atherosclerotic irregularity
particularly in the PCA branches.

## 2020-10-20 ENCOUNTER — Other Ambulatory Visit (HOSPITAL_BASED_OUTPATIENT_CLINIC_OR_DEPARTMENT_OTHER): Payer: Self-pay

## 2020-11-18 ENCOUNTER — Other Ambulatory Visit: Payer: Self-pay | Admitting: Family Medicine

## 2020-12-03 ENCOUNTER — Other Ambulatory Visit (HOSPITAL_BASED_OUTPATIENT_CLINIC_OR_DEPARTMENT_OTHER): Payer: Self-pay

## 2020-12-06 ENCOUNTER — Ambulatory Visit: Payer: 59 | Admitting: Family Medicine

## 2020-12-07 ENCOUNTER — Other Ambulatory Visit: Payer: Self-pay | Admitting: Family Medicine

## 2020-12-07 DIAGNOSIS — E1165 Type 2 diabetes mellitus with hyperglycemia: Secondary | ICD-10-CM

## 2020-12-08 ENCOUNTER — Ambulatory Visit: Payer: 59 | Admitting: Obstetrics & Gynecology

## 2020-12-08 ENCOUNTER — Other Ambulatory Visit (HOSPITAL_BASED_OUTPATIENT_CLINIC_OR_DEPARTMENT_OTHER): Payer: Self-pay

## 2020-12-08 DIAGNOSIS — M5137 Other intervertebral disc degeneration, lumbosacral region: Secondary | ICD-10-CM | POA: Insufficient documentation

## 2020-12-08 MED ORDER — HYDROCODONE-ACETAMINOPHEN 5-325 MG PO TABS
ORAL_TABLET | ORAL | 0 refills | Status: DC
  Filled 2020-12-08: qty 12, 3d supply, fill #0

## 2020-12-09 ENCOUNTER — Other Ambulatory Visit: Payer: Self-pay

## 2020-12-13 ENCOUNTER — Other Ambulatory Visit: Payer: Self-pay

## 2020-12-13 ENCOUNTER — Other Ambulatory Visit (HOSPITAL_BASED_OUTPATIENT_CLINIC_OR_DEPARTMENT_OTHER): Payer: Self-pay

## 2020-12-13 ENCOUNTER — Ambulatory Visit (INDEPENDENT_AMBULATORY_CARE_PROVIDER_SITE_OTHER): Payer: 59 | Admitting: Obstetrics & Gynecology

## 2020-12-13 VITALS — BP 136/76 | HR 75 | Wt 216.0 lb

## 2020-12-13 DIAGNOSIS — Z01419 Encounter for gynecological examination (general) (routine) without abnormal findings: Secondary | ICD-10-CM | POA: Diagnosis not present

## 2020-12-13 DIAGNOSIS — Z1231 Encounter for screening mammogram for malignant neoplasm of breast: Secondary | ICD-10-CM

## 2020-12-13 DIAGNOSIS — E2839 Other primary ovarian failure: Secondary | ICD-10-CM | POA: Diagnosis not present

## 2020-12-13 MED ORDER — ESTRADIOL 0.05 MG/24HR TD PTTW
1.0000 | MEDICATED_PATCH | TRANSDERMAL | 12 refills | Status: DC
  Filled 2020-12-13 – 2021-02-11 (×2): qty 8, 28d supply, fill #0

## 2020-12-13 NOTE — Progress Notes (Signed)
Subjective:     Grace Gregory is a 60 y.o. female here for a routine exam. Current complaints: Pt reports occ irritation after intercourse. Uses lubrication but, is not sure the brand or name. She reports sweating more in the heat and thinks that could be the reason. Had chest pain and was seen last week in the Ed. She has a appt with cardiology later today.      Gynecologic History No LMP recorded (lmp unknown). Patient has had a hysterectomy. Contraception: post menopausal status Last Pap: prior to hysterectomy   Last mammogram: 02/10/2020. Results were: normal  Obstetric History OB History  Gravida Para Term Preterm AB Living  1       1    SAB IAB Ectopic Multiple Live Births  1            # Outcome Date GA Lbr Len/2nd Weight Sex Delivery Anes PTL Lv  1 SAB 2001             The following portions of the patient's history were reviewed and updated as appropriate: allergies, current medications, past family history, past medical history, past social history, past surgical history, and problem list.  Review of Systems Pertinent items are noted in HPI.    Objective:  BP 136/76   Pulse 75   Wt 216 lb (98 kg)   LMP  (LMP Unknown)   BMI 39.51 kg/m  General Appearance:    Alert, cooperative, no distress, appears stated age  Head:    Normocephalic, without obvious abnormality, atraumatic  Eyes:    conjunctiva/corneas clear, EOM's intact, both eyes  Ears:    Normal external ear canals, both ears  Nose:   Nares normal, septum midline, mucosa normal, no drainage    or sinus tenderness  Throat:   Lips, mucosa, and tongue normal; teeth and gums normal  Neck:   Supple, symmetrical, trachea midline, no adenopathy;    thyroid:  no enlargement/tenderness/nodules  Back:     Symmetric, no curvature, ROM normal, no CVA tenderness  Lungs:     respirations unlabored  Chest Wall:    No tenderness or deformity   Heart:    Regular rate and rhythm  Breast Exam:    No tenderness, masses,  or nipple abnormality  Abdomen:     Soft, non-tender, bowel sounds active all four quadrants,    no masses, no organomegaly  Genitalia:    Normal female without lesion, discharge or tenderness   No changes  Extremities:   Extremities normal, atraumatic, no cyanosis or edema  Pulses:   2+ and symmetric all extremities  Skin:   Skin color, texture, turgor normal, no rashes or lesions     Assessment:    Healthy female exam.  Breast cancer screening [facility]- reviewed lubrication options   Plan:  Grace Gregory was seen today for annual exam.  Diagnoses and all orders for this visit:  Well female exam with routine gynecological exam  Estrogen deficiency -     estradiol (VIVELLE-DOT) 0.05 MG/24HR patch; Place 1 patch (0.05 mg total) onto the skin 2 (two) times a week.  Breast cancer screening by mammogram -     MS Digital Screening; Future   F/u in 1 year or sooner prn   Corretta Munce L. Harraway-Smith, M.D., [name]

## 2020-12-14 ENCOUNTER — Ambulatory Visit: Payer: 59 | Admitting: Family Medicine

## 2020-12-23 ENCOUNTER — Ambulatory Visit (INDEPENDENT_AMBULATORY_CARE_PROVIDER_SITE_OTHER): Payer: 59 | Admitting: Family Medicine

## 2020-12-23 ENCOUNTER — Other Ambulatory Visit (HOSPITAL_BASED_OUTPATIENT_CLINIC_OR_DEPARTMENT_OTHER): Payer: Self-pay

## 2020-12-23 ENCOUNTER — Ambulatory Visit (INDEPENDENT_AMBULATORY_CARE_PROVIDER_SITE_OTHER): Payer: 59

## 2020-12-23 ENCOUNTER — Encounter: Payer: Self-pay | Admitting: Family Medicine

## 2020-12-23 ENCOUNTER — Other Ambulatory Visit: Payer: Self-pay

## 2020-12-23 VITALS — BP 132/76 | HR 76 | Temp 97.0°F | Ht 62.0 in | Wt 216.8 lb

## 2020-12-23 DIAGNOSIS — E7800 Pure hypercholesterolemia, unspecified: Secondary | ICD-10-CM

## 2020-12-23 DIAGNOSIS — J4520 Mild intermittent asthma, uncomplicated: Secondary | ICD-10-CM

## 2020-12-23 DIAGNOSIS — E1165 Type 2 diabetes mellitus with hyperglycemia: Secondary | ICD-10-CM

## 2020-12-23 DIAGNOSIS — I10 Essential (primary) hypertension: Secondary | ICD-10-CM | POA: Diagnosis not present

## 2020-12-23 DIAGNOSIS — R079 Chest pain, unspecified: Secondary | ICD-10-CM | POA: Diagnosis not present

## 2020-12-23 DIAGNOSIS — K219 Gastro-esophageal reflux disease without esophagitis: Secondary | ICD-10-CM

## 2020-12-23 LAB — BASIC METABOLIC PANEL
BUN: 15 mg/dL (ref 6–23)
CO2: 29 mEq/L (ref 19–32)
Calcium: 9.9 mg/dL (ref 8.4–10.5)
Chloride: 100 mEq/L (ref 96–112)
Creatinine, Ser: 0.86 mg/dL (ref 0.40–1.20)
GFR: 73.12 mL/min (ref >60.00)
Glucose, Bld: 98 mg/dL (ref 70–99)
Potassium: 4 mEq/L (ref 3.5–5.1)
Sodium: 138 mEq/L (ref 135–145)

## 2020-12-23 LAB — URINALYSIS, ROUTINE W REFLEX MICROSCOPIC
Bilirubin Urine: NEGATIVE
Hgb urine dipstick: NEGATIVE
Ketones, ur: NEGATIVE
Leukocytes,Ua: NEGATIVE
Nitrite: NEGATIVE
RBC / HPF: NONE SEEN (ref 0–?)
Specific Gravity, Urine: 1.02 (ref 1.000–1.030)
Total Protein, Urine: NEGATIVE
Urine Glucose: NEGATIVE
Urobilinogen, UA: 0.2 (ref 0.0–1.0)
pH: 6 (ref 5.0–8.0)

## 2020-12-23 LAB — HEPATIC FUNCTION PANEL
ALT: 20 U/L (ref 0–35)
AST: 13 U/L (ref 0–37)
Albumin: 4.1 g/dL (ref 3.5–5.2)
Alkaline Phosphatase: 66 U/L (ref 39–117)
Bilirubin, Direct: 0.1 mg/dL (ref 0.0–0.3)
Total Bilirubin: 0.3 mg/dL (ref 0.2–1.2)
Total Protein: 7.3 g/dL (ref 6.0–8.3)

## 2020-12-23 LAB — CBC
HCT: 39.5 % (ref 36.0–46.0)
Hemoglobin: 12.8 g/dL (ref 12.0–15.0)
MCHC: 32.3 g/dL (ref 30.0–36.0)
MCV: 87 fl (ref 78.0–100.0)
Platelets: 329 10*3/uL (ref 150.0–400.0)
RBC: 4.54 Mil/uL (ref 3.87–5.11)
RDW: 14.3 % (ref 11.5–15.5)
WBC: 6.5 10*3/uL (ref 4.0–10.5)

## 2020-12-23 LAB — HEMOGLOBIN A1C: Hgb A1c MFr Bld: 7.3 % — ABNORMAL HIGH (ref 4.6–6.5)

## 2020-12-23 LAB — LDL CHOLESTEROL, DIRECT: Direct LDL: 88 mg/dL

## 2020-12-23 LAB — AMYLASE: Amylase: 46 U/L (ref 27–131)

## 2020-12-23 MED ORDER — PANTOPRAZOLE SODIUM 40 MG PO TBEC
40.0000 mg | DELAYED_RELEASE_TABLET | Freq: Two times a day (BID) | ORAL | 2 refills | Status: DC
  Filled 2020-12-23: qty 60, 30d supply, fill #0

## 2020-12-23 NOTE — Progress Notes (Addendum)
Established Patient Office Visit  Subjective:  Patient ID: Grace Gregory, female    DOB: [birth year]  Age: 60 y.o. MRN: [id number]  CC:  Chief Complaint  Patient presents with   Follow-up    3 month follow up on DM, patient states that she was seen in ER for chest pains a month ago still having some chest pains not sure why.     HPI Grace Gregory presents for follow-up of hypertension, diabetes, elevated cholesterol and GERD.  She has been experiencing upper chest pain.  There was one episode when it was associated with shortness of breath.  She was evaluated at urgent care and received a negative cardiac evaluation.  Status post stress test with echocardiogram.  She feels as though she may be having some breakthrough GERD on the Protonix 40 mg daily.  She has associated the chest pain with certain foods.  She continues to go to the gym and workout on the treadmill and elliptical.  There is no direct/consistent correlation with exertion.  Abdominal ultrasound in 2021 was essentially normal save fatty liver disease.  Patient denies cough fever dysphagia.  Asthma is well controlled with Flovent.  Rarely uses rescue inhaler.  She is rinsing after using the Flovent.  Diabetes hypertension and elevated ldl cholesterol are all controlled on current medications.  She is nonfasting today.  Past Medical History:  Diagnosis Date   Arthritis    Asthma    Diabetes ([place])    Elevated cholesterol    GERD (gastroesophageal reflux disease)    Hypertension    Obesity    Seasonal allergies    Sleep apnea    On CPAP Machine     Past Surgical History:  Procedure Laterality Date   ABDOMINAL HYSTERECTOMY     BACK SURGERY     COLONOSCOPY     age 59 in [place] Normal    VULVECTOMY N/A 08/06/2018   Procedure: WIDE LOCAL EXCISION VULVA;  Surgeon: Everitt Amber, MD;  Location: [facility];  Service: Gynecology;  Laterality: N/A;    Family History  Problem Relation Age of  Onset   Hypertension Mother    Heart disease Father    Hypertension Sister    Hypertension Brother    Hypertension Sister    Hypertension Sister    Brain cancer Brother    Hypertension Brother    Colon cancer Neg Hx    Esophageal cancer Neg Hx    Stomach cancer Neg Hx    Rectal cancer Neg Hx     Social History   Socioeconomic History   Marital status: Married    Spouse name: Not on file   Number of children: Not on file   Years of education: Not on file   Highest education level: Not on file  Occupational History   Not on file  Tobacco Use   Smoking status: Never   Smokeless tobacco: Never  Vaping Use   Vaping Use: Never used  Substance and Sexual Activity   Alcohol use: Yes    Comment: occassionally    Drug use: Never   Sexual activity: Yes    Birth control/protection: None  Other Topics Concern   Not on file  Social History Narrative   Not on file   Social Determinants of Health   Financial Resource Strain: Not on file  Food Insecurity: Not on file  Transportation Needs: Not on file  Physical Activity: Not on file  Stress: Not on file  Social Connections: Not on file  Intimate Partner Violence: Not on file    Outpatient Medications Prior to Visit  Medication Sig Dispense Refill   albuterol (VENTOLIN HFA) 108 (90 Base) MCG/ACT inhaler Inhale 2 puffs into the lungs every 4-6 hours as needed for cough/wheeze. 18 g 0   AMBULATORY NON FORMULARY MEDICATION Medication Name: Allergy shot x2 a week     atorvastatin (LIPITOR) 10 MG tablet Take 1 tablet (10 mg total) by mouth daily. 90 tablet 3   AUVI-Q 0.3 MG/0.3ML SOAJ injection      Blood Glucose Monitoring Suppl (FREESTYLE LITE) DEVI Use to test blood sugars 1-2 times daily. 1 each 0   cetirizine (ZYRTEC) 10 MG tablet Take 10 mg by mouth daily.     clobetasol cream (TEMOVATE) [id number] % Apply 1 application topically 2 (two) times daily. Not for face or private areas. 60 g 0   estradiol (VIVELLE-DOT) 0.05 MG/24HR  patch Place 1 patch (0.05 mg total) onto the skin 2 (two) times a week. 8 patch 12   fluticasone (FLOVENT HFA) 110 MCG/ACT inhaler INHALE 2 PUFFS BY MOUTH TWICE A DAY 12 g 3   gabapentin (NEURONTIN) 300 MG capsule TAKE 1 CAPSULE BY MOUTH  TWICE DAILY 180 capsule 3   glucose blood (FREESTYLE LITE) test strip Use to test blood sugars 1-2 times daily. 100 each 12   hydrochlorothiazide (HYDRODIURIL) 12.5 MG tablet TAKE 1 TABLET BY MOUTH  DAILY 90 tablet 3   Lancets (FREESTYLE) lancets Use to test blood sugars 1-2 times daily. 100 each 12   losartan (COZAAR) 50 MG tablet TAKE 1 TABLET BY MOUTH  DAILY 90 tablet 3   metFORMIN (GLUCOPHAGE-XR) 500 MG 24 hr tablet TAKE 2 TABLETS BY MOUTH  TWICE DAILY 360 tablet 3   metoprolol succinate (TOPROL-XL) 25 MG 24 hr tablet      montelukast (SINGULAIR) 10 MG tablet Take 1 tablet (10 mg total) by mouth daily. 90 tablet 2   pantoprazole (PROTONIX) 40 MG tablet TAKE 1 TABLET BY MOUTH  DAILY 90 tablet 3   PROAIR HFA 108 (90 Base) MCG/ACT inhaler 1 puff as needed. Use as directed     HYDROcodone-acetaminophen (NORCO/VICODIN) 5-325 MG tablet Take one tablet by mouth every 6 (six) hours as needed for Pain for up to 3 days. (Patient not taking: Reported on 12/23/2020) 12 tablet 0   mometasone (NASONEX) 50 MCG/ACT nasal spray PLACE 1 SPRAY IN EACH NOSTRIL. MAY INCREASE TO 2 SPRAYS IN EACH NOSTRIL AS TOLERATED (Patient not taking: Reported on 12/13/2020) 17 g 2   No facility-administered medications prior to visit.    Allergies  Allergen Reactions   Lisinopril Cough   Other Rash   Penicillins Rash    ROS Review of Systems  Constitutional:  Negative for diaphoresis, fatigue, fever and unexpected weight change.  HENT: Negative.    Eyes:  Negative for photophobia and visual disturbance.  Respiratory:  Positive for apnea and shortness of breath. Negative for cough, choking, chest tightness, wheezing and stridor.   Cardiovascular:  Positive for chest pain. Negative for  palpitations and leg swelling.  Gastrointestinal:  Negative for abdominal pain, nausea and vomiting.  Genitourinary: Negative.   Psychiatric/Behavioral:  Negative for dysphoric mood. The patient is not nervous/anxious.      Objective:    Physical Exam  BP 132/76 (BP Location: Left Arm, Patient Position: Sitting, Cuff Size: Normal)   Pulse 76   Temp (!) 97 F (36.1 C) (Temporal)   Ht 5'  2" (1.575 m)   Wt 216 lb 12.8 oz (98.3 kg)   LMP  (LMP Unknown)   SpO2 97%   BMI 39.65 kg/m  Wt Readings from Last 3 Encounters:  12/23/20 216 lb 12.8 oz (98.3 kg)  12/13/20 216 lb (98 kg)  08/31/20 220 lb 6.4 oz (100 kg)     Health Maintenance Due  Topic Date Due   PNEUMOCOCCAL POLYSACCHARIDE VACCINE AGE 15-64 HIGH RISK  Never done   Pneumococcal Vaccine 52-36 Years old (1 - PCV) Never done   Hepatitis C Screening  Never done   TETANUS/TDAP  12/21/2019   COVID-19 Vaccine (4 - Booster for Pfizer series) 06/09/2020   INFLUENZA VACCINE  12/20/2020    There are no preventive care reminders to display for this patient.  Lab Results  Component Value Date   TSH 3.09 12/31/2018   Lab Results  Component Value Date   WBC 4.6 08/31/2020   HGB 13.4 08/31/2020   HCT 40.6 08/31/2020   MCV 87.1 08/31/2020   PLT 341.0 08/31/2020   Lab Results  Component Value Date   NA 139 08/31/2020   K 3.8 08/31/2020   CO2 28 08/31/2020   GLUCOSE 114 (H) 08/31/2020   BUN 10 08/31/2020   CREATININE 0.86 08/31/2020   BILITOT 0.5 08/31/2020   ALKPHOS 74 08/31/2020   AST 16 08/31/2020   ALT 23 08/31/2020   PROT 7.6 08/31/2020   ALBUMIN 4.2 08/31/2020   CALCIUM 9.9 08/31/2020   ANIONGAP 10 03/07/2019   GFR 73.28 08/31/2020   Lab Results  Component Value Date   CHOL 137 08/31/2020   Lab Results  Component Value Date   HDL 43.20 08/31/2020   Lab Results  Component Value Date   LDLCALC 76 08/31/2020   Lab Results  Component Value Date   TRIG 92.0 08/31/2020   Lab Results  Component Value  Date   CHOLHDL 3 08/31/2020   Lab Results  Component Value Date   HGBA1C 7.5 (H) 08/31/2020      Assessment & Plan:   Problem List Items Addressed This Visit       Cardiovascular and Mediastinum   Essential hypertension   Relevant Orders   CBC   Basic metabolic panel     Digestive   Gastroesophageal reflux disease   Relevant Medications   pantoprazole (PROTONIX) 40 MG tablet     Endocrine   Type 2 diabetes mellitus with hyperglycemia, without long-term current use of insulin (HCC) - Primary   Relevant Orders   CBC   Basic metabolic panel   Hemoglobin A1c   LDL cholesterol, direct   Urinalysis, Routine w reflex microscopic   Other Visit Diagnoses     Elevated LDL cholesterol level       Relevant Orders   Hepatic function panel   LDL cholesterol, direct   Chest pain, unspecified type       Relevant Medications   pantoprazole (PROTONIX) 40 MG tablet   Other Relevant Orders   DG Chest 2 View   Amylase   Ambulatory referral to Pulmonology   Mild intermittent asthma without complication       Relevant Orders   Ambulatory referral to Pulmonology       Meds ordered this encounter  Medications   pantoprazole (PROTONIX) 40 MG tablet    Sig: Take 1 tablet (40 mg total) by mouth 2 (two) times daily before a meal.    Dispense:  60 tablet    Refill:  2  Follow-up: Return follow up in 1-3 months as symptoms dictate. Sooner if not improving..   Have increased Protonix to 40 mg twice daily.  Hopefully this will help the chest pain.  Return in 1 to 3 months if not report improving.  Spent 40 minutes between interview and exam record review and researching.  Of asked for pulmonary consultation and follow-up for her asthma. Libby Maw, MD  8/8 addendum: CXR showed atherosclerosis of aorta. Need to talk to patient about adding GLP-1 agonist next visit.

## 2020-12-24 ENCOUNTER — Other Ambulatory Visit (HOSPITAL_BASED_OUTPATIENT_CLINIC_OR_DEPARTMENT_OTHER): Payer: Self-pay

## 2020-12-29 NOTE — Progress Notes (Signed)
Spoke with patient regarding chest xray results per patient she has a referral for pulmonologist but she does not have a cardiologist that she sees. Patient would like to know if Provider would like to refer her to cardiologist? Please advise.

## 2021-01-11 ENCOUNTER — Other Ambulatory Visit (HOSPITAL_BASED_OUTPATIENT_CLINIC_OR_DEPARTMENT_OTHER): Payer: Self-pay

## 2021-01-11 MED ORDER — FLUTICASONE PROPIONATE HFA 110 MCG/ACT IN AERO
INHALATION_SPRAY | RESPIRATORY_TRACT | 3 refills | Status: DC
  Filled 2021-01-11: qty 12, 30d supply, fill #0
  Filled 2021-04-26: qty 12, 30d supply, fill #1
  Filled 2021-06-09: qty 12, 30d supply, fill #2
  Filled 2021-08-23: qty 12, 30d supply, fill #3

## 2021-01-11 MED ORDER — MONTELUKAST SODIUM 10 MG PO TABS
ORAL_TABLET | ORAL | 5 refills | Status: DC
  Filled 2021-01-11: qty 30, 30d supply, fill #0

## 2021-01-11 MED ORDER — ALBUTEROL SULFATE HFA 108 (90 BASE) MCG/ACT IN AERS
INHALATION_SPRAY | RESPIRATORY_TRACT | 0 refills | Status: DC
  Filled 2021-01-11: qty 8.5, 25d supply, fill #0

## 2021-01-11 MED ORDER — TRIAMCINOLONE ACETONIDE 0.1 % EX OINT
TOPICAL_OINTMENT | CUTANEOUS | 3 refills | Status: DC
  Filled 2021-01-11: qty 30, 30d supply, fill #0

## 2021-01-13 ENCOUNTER — Other Ambulatory Visit (HOSPITAL_BASED_OUTPATIENT_CLINIC_OR_DEPARTMENT_OTHER): Payer: Self-pay

## 2021-01-20 ENCOUNTER — Other Ambulatory Visit: Payer: Self-pay | Admitting: Family Medicine

## 2021-02-11 ENCOUNTER — Other Ambulatory Visit (HOSPITAL_BASED_OUTPATIENT_CLINIC_OR_DEPARTMENT_OTHER): Payer: Self-pay

## 2021-02-14 ENCOUNTER — Other Ambulatory Visit: Payer: Self-pay

## 2021-02-14 ENCOUNTER — Encounter (HOSPITAL_BASED_OUTPATIENT_CLINIC_OR_DEPARTMENT_OTHER): Payer: Self-pay

## 2021-02-14 ENCOUNTER — Ambulatory Visit (HOSPITAL_BASED_OUTPATIENT_CLINIC_OR_DEPARTMENT_OTHER)
Admission: RE | Admit: 2021-02-14 | Discharge: 2021-02-14 | Disposition: A | Discharge status: Home / Self Care | Payer: 59 | Source: Ambulatory Visit | Attending: Obstetrics & Gynecology | Admitting: Obstetrics & Gynecology

## 2021-02-14 DIAGNOSIS — Z1231 Encounter for screening mammogram for malignant neoplasm of breast: Secondary | ICD-10-CM | POA: Diagnosis present

## 2021-03-14 ENCOUNTER — Ambulatory Visit: Payer: 59 | Attending: Internal Medicine

## 2021-03-14 DIAGNOSIS — Z23 Encounter for immunization: Secondary | ICD-10-CM

## 2021-03-14 NOTE — Progress Notes (Signed)
   Covid-19 Vaccination Clinic  Name:  Travonna Swindle    MRN: [id number] DOB: [birth year]  03/14/2021  Ms. Janak was observed post Covid-19 immunization for 15 minutes without incident. She was provided with Vaccine Information Sheet and instruction to access the V-Safe system.   Ms. Poe was instructed to call 911 with any severe reactions post vaccine: Difficulty breathing  Swelling of face and throat  A fast heartbeat  A bad rash all over body  Dizziness and weakness   Immunizations Administered     Name Date Dose VIS Date Route   Pfizer Covid-19 Vaccine Bivalent Booster 03/14/2021 12:07 PM 0.3 mL 01/19/2021 Intramuscular   Manufacturer: [place]   Lot: [id number]   [place]: [phone]

## 2021-03-16 LAB — HM DIABETES EYE EXAM

## 2021-03-24 ENCOUNTER — Ambulatory Visit (INDEPENDENT_AMBULATORY_CARE_PROVIDER_SITE_OTHER): Payer: 59 | Admitting: Nurse Practitioner

## 2021-03-24 ENCOUNTER — Other Ambulatory Visit (HOSPITAL_BASED_OUTPATIENT_CLINIC_OR_DEPARTMENT_OTHER): Payer: Self-pay

## 2021-03-24 ENCOUNTER — Encounter: Payer: Self-pay | Admitting: Family Medicine

## 2021-03-24 ENCOUNTER — Encounter: Payer: Self-pay | Admitting: Nurse Practitioner

## 2021-03-24 ENCOUNTER — Other Ambulatory Visit: Payer: Self-pay

## 2021-03-24 VITALS — BP 116/74 | HR 74 | Temp 96.7°F | Resp 12 | Ht 62.0 in | Wt 216.1 lb

## 2021-03-24 DIAGNOSIS — R42 Dizziness and giddiness: Secondary | ICD-10-CM | POA: Diagnosis not present

## 2021-03-24 MED ORDER — FLUTICASONE PROPIONATE 50 MCG/ACT NA SUSP
2.0000 | Freq: Every day | NASAL | 0 refills | Status: DC
Start: 2021-03-24 — End: 2021-05-11
  Filled 2021-03-24: qty 16, 30d supply, fill #0

## 2021-03-24 MED ORDER — MECLIZINE HCL 25 MG PO TABS
25.0000 mg | ORAL_TABLET | Freq: Three times a day (TID) | ORAL | 0 refills | Status: DC | PRN
  Filled 2021-03-24: qty 30, 10d supply, fill #0

## 2021-03-24 NOTE — Patient Instructions (Signed)
Nice to see you today Will be in touch in regards to your labs Follow up with Dr. Ethelene Hal if you symptoms do not improve

## 2021-03-24 NOTE — Assessment & Plan Note (Signed)
Patient having several days with the dizziness does not seem to be central related to her position described as the room spinning.  Patient does have a history of vertigo has tried meclizine taken once a day unsure if that is beneficial or not.  Neuro exam completely benign having intermittent headache that does abate with over-the-counter medication we will try meclizine 25 mg 3 times daily as needed and Flonase daily if no improvement she will follow-up with Dr. Alfonso Ramus in office EKG looked okay in office.  Signs and symptoms reviewed as well as seek emergency room care over the weekend.  Pending lab results.

## 2021-03-24 NOTE — Progress Notes (Signed)
Acute Office Visit  Subjective:    Patient ID: Grace Gregory, female    DOB: 1960-07-19, 60 y.o.   MRN: [id number]  Chief Complaint  Patient presents with   Dizziness    Since 03/19/21-feels like things are moving around her, happens every day, almost all day long. Nausea present but no  vomiting. Ears feels full-especially the right one. Headache-has seasonal allergies. No chest pain or tightness.     Patient is in today for dizziness  Started on Saturday, patient states that it hit her when she got up.  Intermittent but becoming more frequent. States that she does have a headache and the right ear felt like she was on a plane.  Hx of vertigo. No stroke or cardiovascular event per patient report.  States that it can vary in length that it last. Can drive ok but states when she rides as a passenger that it is worse. Does have nausea present with it at times  Past Medical History:  Diagnosis Date   Arthritis    Asthma    Diabetes ([place])    Elevated cholesterol    GERD (gastroesophageal reflux disease)    Hypertension    Obesity    Seasonal allergies    Sleep apnea    On CPAP Machine     Past Surgical History:  Procedure Laterality Date   ABDOMINAL HYSTERECTOMY     BACK SURGERY     COLONOSCOPY     age 1 in [place]    VULVECTOMY N/A 08/06/2018   Procedure: WIDE LOCAL EXCISION VULVA;  Surgeon: Everitt Amber, MD;  Location: [facility];  Service: Gynecology;  Laterality: N/A;    Family History  Problem Relation Age of Onset   Hypertension Mother    Heart disease Father    Hypertension Sister    Hypertension Brother    Hypertension Sister    Hypertension Sister    Brain cancer Brother    Hypertension Brother    Colon cancer Neg Hx    Esophageal cancer Neg Hx    Stomach cancer Neg Hx    Rectal cancer Neg Hx     Social History   Socioeconomic History   Marital status: Married    Spouse name: Not on file   Number of  children: Not on file   Years of education: Not on file   Highest education level: Not on file  Occupational History   Not on file  Tobacco Use   Smoking status: Never   Smokeless tobacco: Never  Vaping Use   Vaping Use: Never used  Substance and Sexual Activity   Alcohol use: Yes    Comment: occassionally    Drug use: Never   Sexual activity: Yes    Birth control/protection: None  Other Topics Concern   Not on file  Social History Narrative   Not on file   Social Determinants of Health   Financial Resource Strain: Not on file  Food Insecurity: Not on file  Transportation Needs: Not on file  Physical Activity: Not on file  Stress: Not on file  Social Connections: Not on file  Intimate Partner Violence: Not on file    Outpatient Medications Prior to Visit  Medication Sig Dispense Refill   albuterol (VENTOLIN HFA) 108 (90 Base) MCG/ACT inhaler Inhale 2 puffs into the lungs every 4-6 hours as needed for cough/wheeze. 18 g 0   AMBULATORY NON FORMULARY MEDICATION Medication Name: Allergy shot x2 a week  atorvastatin (LIPITOR) 10 MG tablet Take 1 tablet (10 mg total) by mouth daily. 90 tablet 3   AUVI-Q 0.3 MG/0.3ML SOAJ injection      Blood Glucose Monitoring Suppl (FREESTYLE LITE) DEVI Use to test blood sugars 1-2 times daily. 1 each 0   cetirizine (ZYRTEC) 10 MG tablet Take 10 mg by mouth daily.     clobetasol cream (TEMOVATE) 7.82 % Apply 1 application topically 2 (two) times daily. Not for face or private areas. 60 g 0   estradiol (VIVELLE-DOT) 0.05 MG/24HR patch Place 1 patch (0.05 mg total) onto the skin 2 (two) times a week. 8 patch 12   fluticasone (FLOVENT HFA) 110 MCG/ACT inhaler Inhale 2 puffs into the lungs 2 times daily 36 g 3   gabapentin (NEURONTIN) 300 MG capsule TAKE 1 CAPSULE BY MOUTH  TWICE DAILY 180 capsule 3   glucose blood (FREESTYLE LITE) test strip Use to test blood sugars 1-2 times daily. 100 each 12   hydrochlorothiazide (HYDRODIURIL) 12.5 MG  tablet TAKE 1 TABLET BY MOUTH  DAILY 90 tablet 3   Lancets (FREESTYLE) lancets Use to test blood sugars 1-2 times daily. 100 each 12   losartan (COZAAR) 50 MG tablet TAKE 1 TABLET BY MOUTH  DAILY 90 tablet 3   metFORMIN (GLUCOPHAGE-XR) 500 MG 24 hr tablet TAKE 2 TABLETS BY MOUTH  TWICE DAILY 360 tablet 3   metoprolol succinate (TOPROL-XL) 25 MG 24 hr tablet      montelukast (SINGULAIR) 10 MG tablet TAKE 1 TABLET BY MOUTH  DAILY 90 tablet 3   pantoprazole (PROTONIX) 40 MG tablet Take 1 tablet (40 mg total) by mouth 2 (two) times daily before a meal. 60 tablet 2   triamcinolone ointment (KENALOG) 0.1 % Apply 1 application on to the skin 2 times daily 60 g 3   albuterol (PROAIR HFA) 108 (90 Base) MCG/ACT inhaler Inhale 2 puffs into the lungs every 4 - 6 hours as needed for coughing/wheezing 8.5 g 0   No facility-administered medications prior to visit.    Allergies  Allergen Reactions   Lisinopril Cough   Other Rash   Propoxyphene Rash   Penicillins Rash    Review of Systems  Constitutional:  Negative for chills and fever.  HENT:  Negative for sinus pressure and sinus pain.   Respiratory:  Negative for cough and shortness of breath.   Cardiovascular:  Negative for chest pain.  Gastrointestinal:  Positive for nausea. Negative for abdominal pain, constipation, diarrhea and vomiting.  Musculoskeletal:  Negative for neck pain and neck stiffness.  Neurological:  Positive for dizziness and headaches. Negative for weakness and numbness.      Objective:    Physical Exam Constitutional:      Appearance: She is obese.  HENT:     Right Ear: Tympanic membrane, ear canal and external ear normal.     Left Ear: Tympanic membrane, ear canal and external ear normal.     Mouth/Throat:     Mouth: Mucous membranes are moist.     Pharynx: Oropharynx is clear.  Eyes:     Extraocular Movements: Extraocular movements intact.     Right eye: Normal extraocular motion and no nystagmus.     Left eye:  Normal extraocular motion and no nystagmus.     Pupils: Pupils are equal, round, and reactive to light.  Cardiovascular:     Rate and Rhythm: Normal rate and regular rhythm.     Pulses: Normal pulses.  Pulmonary:  Effort: Pulmonary effort is normal.     Breath sounds: Normal breath sounds.  Skin:    General: Skin is warm.  Neurological:     General: No focal deficit present.     Mental Status: She is alert.     Cranial Nerves: Cranial nerves 2-12 are intact. No cranial nerve deficit.     Sensory: No sensory deficit.     Motor: No weakness.     Coordination: Coordination normal. Finger-Nose-Finger Test normal.     Gait: Gait and tandem walk normal.     Deep Tendon Reflexes: Reflexes normal.     Reflex Scores:      Bicep reflexes are 2+ on the right side and 2+ on the left side.      Patellar reflexes are 1+ on the right side and 1+ on the left side. Psychiatric:        Mood and Affect: Mood normal.        Behavior: Behavior normal.        Thought Content: Thought content normal.        Judgment: Judgment normal.    BP 116/74   Pulse 74   Temp (!) 96.7 F (35.9 C)   Resp 12   Ht 5\' 2"  (1.575 m)   Wt 216 lb 2 oz (98 kg)   LMP  (LMP Unknown)   SpO2 97%   BMI 39.53 kg/m  Wt Readings from Last 3 Encounters:  03/24/21 216 lb 2 oz (98 kg)  12/23/20 216 lb 12.8 oz (98.3 kg)  12/13/20 216 lb (98 kg)    Health Maintenance Due  Topic Date Due   Pneumococcal Vaccine 21-58 Years old (1 - PCV) Never done   Hepatitis C Screening  Never done   TETANUS/TDAP  12/21/2019   OPHTHALMOLOGY EXAM  03/16/2021    There are no preventive care reminders to display for this patient.   Lab Results  Component Value Date   TSH 3.09 12/31/2018   Lab Results  Component Value Date   WBC 6.5 12/23/2020   HGB 12.8 12/23/2020   HCT 39.5 12/23/2020   MCV 87.0 12/23/2020   PLT 329.0 12/23/2020   Lab Results  Component Value Date   NA 138 12/23/2020   K 4.0 12/23/2020   CO2 29  12/23/2020   GLUCOSE 98 12/23/2020   BUN 15 12/23/2020   CREATININE 0.86 12/23/2020   BILITOT 0.3 12/23/2020   ALKPHOS 66 12/23/2020   AST 13 12/23/2020   ALT 20 12/23/2020   PROT 7.3 12/23/2020   ALBUMIN 4.1 12/23/2020   CALCIUM 9.9 12/23/2020   ANIONGAP 10 03/07/2019   GFR 73.12 12/23/2020   Lab Results  Component Value Date   CHOL 137 08/31/2020   Lab Results  Component Value Date   HDL 43.20 08/31/2020   Lab Results  Component Value Date   LDLCALC 76 08/31/2020   Lab Results  Component Value Date   TRIG 92.0 08/31/2020   Lab Results  Component Value Date   CHOLHDL 3 08/31/2020   Lab Results  Component Value Date   HGBA1C 7.3 (H) 12/23/2020       Assessment & Plan:   Problem List Items Addressed This Visit       Other   Dizziness - Primary    Patient having several days with the dizziness does not seem to be central related to her position described as the room spinning.  Patient does have a history of vertigo has tried  meclizine taken once a day unsure if that is beneficial or not.  Neuro exam completely benign having intermittent headache that does abate with over-the-counter medication we will try meclizine 25 mg 3 times daily as needed and Flonase daily if no improvement she will follow-up with Dr. Alfonso Ramus in office EKG looked okay in office.  Signs and symptoms reviewed as well as seek emergency room care over the weekend.  Pending lab results.      Relevant Medications   fluticasone (FLONASE) 50 MCG/ACT nasal spray   meclizine (ANTIVERT) 25 MG tablet   Other Relevant Orders   EKG 12-Lead (Completed)   CBC   Comprehensive metabolic panel   TSH   Other Visit Diagnoses     Vertigo       Relevant Medications   fluticasone (FLONASE) 50 MCG/ACT nasal spray   meclizine (ANTIVERT) 25 MG tablet        No orders of the defined types were placed in this encounter.  This visit occurred during the SARS-CoV-2 public health emergency.  Safety protocols  were in place, including screening questions prior to the visit, additional usage of staff PPE, and extensive cleaning of exam room while observing appropriate contact time as indicated for disinfecting solutions.   Romilda Garret, NP

## 2021-03-25 ENCOUNTER — Ambulatory Visit: Payer: 59 | Admitting: Family Medicine

## 2021-03-25 LAB — TSH: TSH: 2.44 u[IU]/mL (ref 0.35–5.50)

## 2021-03-25 LAB — COMPREHENSIVE METABOLIC PANEL
ALT: 16 U/L (ref 0–35)
AST: 13 U/L (ref 0–37)
Albumin: 4 g/dL (ref 3.5–5.2)
Alkaline Phosphatase: 61 U/L (ref 39–117)
BUN: 15 mg/dL (ref 6–23)
CO2: 30 mEq/L (ref 19–32)
Calcium: 9.6 mg/dL (ref 8.4–10.5)
Chloride: 102 mEq/L (ref 96–112)
Creatinine, Ser: 1.12 mg/dL (ref 0.40–1.20)
GFR: 53.16 mL/min — ABNORMAL LOW (ref >60.00)
Glucose, Bld: 101 mg/dL — ABNORMAL HIGH (ref 70–99)
Potassium: 4 mEq/L (ref 3.5–5.1)
Sodium: 140 mEq/L (ref 135–145)
Total Bilirubin: 0.3 mg/dL (ref 0.2–1.2)
Total Protein: 7 g/dL (ref 6.0–8.3)

## 2021-03-25 LAB — CBC
HCT: 37.9 % (ref 36.0–46.0)
Hemoglobin: 12.5 g/dL (ref 12.0–15.0)
MCHC: 33 g/dL (ref 30.0–36.0)
MCV: 86.8 fl (ref 78.0–100.0)
Platelets: 353 10*3/uL (ref 150.0–400.0)
RBC: 4.37 Mil/uL (ref 3.87–5.11)
RDW: 13.7 % (ref 11.5–15.5)
WBC: 6.4 10*3/uL (ref 4.0–10.5)

## 2021-03-28 ENCOUNTER — Other Ambulatory Visit: Payer: Self-pay | Admitting: Nurse Practitioner

## 2021-03-28 DIAGNOSIS — R944 Abnormal results of kidney function studies: Secondary | ICD-10-CM

## 2021-04-08 ENCOUNTER — Encounter: Payer: Self-pay | Admitting: Family Medicine

## 2021-04-08 ENCOUNTER — Other Ambulatory Visit (HOSPITAL_BASED_OUTPATIENT_CLINIC_OR_DEPARTMENT_OTHER): Payer: Self-pay

## 2021-04-08 MED ORDER — PFIZER COVID-19 VAC BIVALENT 30 MCG/0.3ML IM SUSP
INTRAMUSCULAR | 0 refills | Status: DC
  Filled 2021-04-08: qty 0.3, 1d supply, fill #0

## 2021-04-12 ENCOUNTER — Other Ambulatory Visit: Payer: Self-pay

## 2021-04-12 ENCOUNTER — Other Ambulatory Visit (INDEPENDENT_AMBULATORY_CARE_PROVIDER_SITE_OTHER): Payer: 59

## 2021-04-12 DIAGNOSIS — R944 Abnormal results of kidney function studies: Secondary | ICD-10-CM | POA: Diagnosis not present

## 2021-04-12 LAB — BASIC METABOLIC PANEL
BUN: 13 mg/dL (ref 6–23)
CO2: 26 mEq/L (ref 19–32)
Calcium: 9.1 mg/dL (ref 8.4–10.5)
Chloride: 104 mEq/L (ref 96–112)
Creatinine, Ser: 1.04 mg/dL (ref 0.40–1.20)
GFR: 58.09 mL/min — ABNORMAL LOW (ref >60.00)
Glucose, Bld: 113 mg/dL — ABNORMAL HIGH (ref 70–99)
Potassium: 4.2 mEq/L (ref 3.5–5.1)
Sodium: 137 mEq/L (ref 135–145)

## 2021-04-13 ENCOUNTER — Other Ambulatory Visit: Payer: 59

## 2021-04-14 ENCOUNTER — Other Ambulatory Visit: Payer: Self-pay | Admitting: Family Medicine

## 2021-04-14 DIAGNOSIS — E7800 Pure hypercholesterolemia, unspecified: Secondary | ICD-10-CM

## 2021-04-17 ENCOUNTER — Encounter: Payer: Self-pay | Admitting: Nurse Practitioner

## 2021-04-19 ENCOUNTER — Other Ambulatory Visit (HOSPITAL_BASED_OUTPATIENT_CLINIC_OR_DEPARTMENT_OTHER): Payer: Self-pay

## 2021-04-19 MED ORDER — CLINDAMYCIN HCL 300 MG PO CAPS
300.0000 mg | ORAL_CAPSULE | Freq: Three times a day (TID) | ORAL | 0 refills | Status: DC
  Filled 2021-04-19: qty 21, 7d supply, fill #0

## 2021-04-26 ENCOUNTER — Other Ambulatory Visit (HOSPITAL_BASED_OUTPATIENT_CLINIC_OR_DEPARTMENT_OTHER): Payer: Self-pay

## 2021-05-04 ENCOUNTER — Other Ambulatory Visit: Payer: Self-pay | Admitting: Family Medicine

## 2021-05-04 ENCOUNTER — Encounter: Payer: Self-pay | Admitting: Family Medicine

## 2021-05-04 DIAGNOSIS — K219 Gastro-esophageal reflux disease without esophagitis: Secondary | ICD-10-CM

## 2021-05-04 DIAGNOSIS — R079 Chest pain, unspecified: Secondary | ICD-10-CM

## 2021-05-04 DIAGNOSIS — E2839 Other primary ovarian failure: Secondary | ICD-10-CM

## 2021-05-05 MED ORDER — PANTOPRAZOLE SODIUM 40 MG PO TBEC: 40.0000 mg | DELAYED_RELEASE_TABLET | Freq: Two times a day (BID) | ORAL | 1 refills | Status: DC

## 2021-05-09 ENCOUNTER — Other Ambulatory Visit: Payer: Self-pay | Admitting: Family Medicine

## 2021-05-09 ENCOUNTER — Other Ambulatory Visit: Payer: Self-pay | Admitting: Nurse Practitioner

## 2021-05-09 ENCOUNTER — Other Ambulatory Visit (HOSPITAL_BASED_OUTPATIENT_CLINIC_OR_DEPARTMENT_OTHER): Payer: Self-pay

## 2021-05-09 DIAGNOSIS — R42 Dizziness and giddiness: Secondary | ICD-10-CM

## 2021-05-10 ENCOUNTER — Other Ambulatory Visit (HOSPITAL_BASED_OUTPATIENT_CLINIC_OR_DEPARTMENT_OTHER): Payer: Self-pay

## 2021-05-11 ENCOUNTER — Encounter: Payer: Self-pay | Admitting: Family Medicine

## 2021-05-11 ENCOUNTER — Other Ambulatory Visit (HOSPITAL_BASED_OUTPATIENT_CLINIC_OR_DEPARTMENT_OTHER): Payer: Self-pay

## 2021-05-11 ENCOUNTER — Other Ambulatory Visit: Payer: Self-pay | Admitting: Nurse Practitioner

## 2021-05-11 DIAGNOSIS — R42 Dizziness and giddiness: Secondary | ICD-10-CM

## 2021-05-11 MED ORDER — FLUTICASONE PROPIONATE 50 MCG/ACT NA SUSP
2.0000 | Freq: Every day | NASAL | 3 refills | Status: DC
Start: 2021-05-11 — End: 2021-11-24
  Filled 2021-05-11: qty 16, 30d supply, fill #0
  Filled 2021-06-09: qty 16, 30d supply, fill #1
  Filled 2021-08-17: qty 16, 30d supply, fill #2

## 2021-05-11 NOTE — Telephone Encounter (Signed)
Spoke to patient and advised on RX was sent in 12/2020 by Dr Leodis Liverpool. She will check with pharmacy and if needed will call her for a refill. Dm/cma

## 2021-05-12 ENCOUNTER — Other Ambulatory Visit (HOSPITAL_BASED_OUTPATIENT_CLINIC_OR_DEPARTMENT_OTHER): Payer: Self-pay

## 2021-05-18 ENCOUNTER — Encounter: Payer: Self-pay | Admitting: Family Medicine

## 2021-05-18 NOTE — Telephone Encounter (Signed)
Patient states that her appointment with Dr. Ethelene Hal was canceled. I'm not able to see the appointment that was schedule are you? Is it okay to schedule another appointment. I have informed patient that Dr. Ethelene Hal is booked for the rest of the week.

## 2021-06-02 ENCOUNTER — Other Ambulatory Visit: Payer: Self-pay

## 2021-06-02 ENCOUNTER — Ambulatory Visit (INDEPENDENT_AMBULATORY_CARE_PROVIDER_SITE_OTHER): Payer: 59 | Admitting: Family Medicine

## 2021-06-02 ENCOUNTER — Encounter: Payer: Self-pay | Admitting: Family Medicine

## 2021-06-02 VITALS — BP 124/78 | HR 75 | Temp 96.9°F | Ht 62.0 in | Wt 212.4 lb

## 2021-06-02 DIAGNOSIS — E1165 Type 2 diabetes mellitus with hyperglycemia: Secondary | ICD-10-CM

## 2021-06-02 DIAGNOSIS — R944 Abnormal results of kidney function studies: Secondary | ICD-10-CM | POA: Diagnosis not present

## 2021-06-02 DIAGNOSIS — I10 Essential (primary) hypertension: Secondary | ICD-10-CM | POA: Diagnosis not present

## 2021-06-02 DIAGNOSIS — E7800 Pure hypercholesterolemia, unspecified: Secondary | ICD-10-CM

## 2021-06-02 DIAGNOSIS — Z23 Encounter for immunization: Secondary | ICD-10-CM

## 2021-06-02 DIAGNOSIS — K760 Fatty (change of) liver, not elsewhere classified: Secondary | ICD-10-CM | POA: Insufficient documentation

## 2021-06-02 MED ORDER — ATORVASTATIN CALCIUM 20 MG PO TABS: 20.0000 mg | ORAL_TABLET | Freq: Every day | ORAL | 3 refills | Status: DC

## 2021-06-02 NOTE — Progress Notes (Signed)
Established Patient Office Visit  Subjective:  Patient ID: Grace Gregory, female    DOB: Jul 12, 1960  Age: 61 y.o. MRN: [id number]  CC:  Chief Complaint  Patient presents with   Follow-up    3-4 month follow up, no concerns.     HPI Grace Gregory presents for follow-up of hypertension, elevated cholesterol, diabetes and renal insufficiency.  Has been doing well on current medical regimen.  Hypertension is well controlled.  Mild decrease in GFR.  She is taking losartan.  LDL cholesterol has not been at goal on low-dose atorvastatin.  Past Medical History:  Diagnosis Date   Arthritis    Asthma    Diabetes ([place])    Elevated cholesterol    GERD (gastroesophageal reflux disease)    Hypertension    Obesity    Seasonal allergies    Sleep apnea    On CPAP Machine     Past Surgical History:  Procedure Laterality Date   ABDOMINAL HYSTERECTOMY     BACK SURGERY     COLONOSCOPY     age 32 in [place] Normal    VULVECTOMY N/A 08/06/2018   Procedure: WIDE LOCAL EXCISION VULVA;  Surgeon: Everitt Amber, MD;  Location: [facility];  Service: Gynecology;  Laterality: N/A;    Family History  Problem Relation Age of Onset   Hypertension Mother    Heart disease Father    Hypertension Sister    Hypertension Brother    Hypertension Sister    Hypertension Sister    Brain cancer Brother    Hypertension Brother    Colon cancer Neg Hx    Esophageal cancer Neg Hx    Stomach cancer Neg Hx    Rectal cancer Neg Hx     Social History   Socioeconomic History   Marital status: Married    Spouse name: Not on file   Number of children: Not on file   Years of education: Not on file   Highest education level: Not on file  Occupational History   Not on file  Tobacco Use   Smoking status: Never   Smokeless tobacco: Never  Vaping Use   Vaping Use: Never used  Substance and Sexual Activity   Alcohol use: Yes    Comment: occassionally    Drug use: Never    Sexual activity: Yes    Birth control/protection: None  Other Topics Concern   Not on file  Social History Narrative   Not on file   Social Determinants of Health   Financial Resource Strain: Not on file  Food Insecurity: Not on file  Transportation Needs: Not on file  Physical Activity: Not on file  Stress: Not on file  Social Connections: Not on file  Intimate Partner Violence: Not on file    Outpatient Medications Prior to Visit  Medication Sig Dispense Refill   albuterol (VENTOLIN HFA) 108 (90 Base) MCG/ACT inhaler Inhale 2 puffs into the lungs every 4-6 hours as needed for cough/wheeze. 18 g 0   AMBULATORY NON FORMULARY MEDICATION Medication Name: Allergy shot x2 a week     AUVI-Q 0.3 MG/0.3ML SOAJ injection      Blood Glucose Monitoring Suppl (FREESTYLE LITE) DEVI Use to test blood sugars 1-2 times daily. 1 each 0   cetirizine (ZYRTEC) 10 MG tablet Take 10 mg by mouth daily.     clobetasol cream (TEMOVATE) 8.52 % Apply 1 application topically 2 (two) times daily. Not for face or private areas. [place]  g 0   estradiol (VIVELLE-DOT) 0.05 MG/24HR patch Place 1 patch (0.05 mg total) onto the skin 2 (two) times a week. 8 patch 12   fluticasone (FLONASE) 50 MCG/ACT nasal spray Place 2 sprays into both nostrils daily. 16 g 3   fluticasone (FLOVENT HFA) 110 MCG/ACT inhaler Inhale 2 puffs into the lungs 2 times daily 36 g 3   gabapentin (NEURONTIN) 300 MG capsule TAKE 1 CAPSULE BY MOUTH  TWICE DAILY 180 capsule 3   glucose blood (FREESTYLE LITE) test strip Use to test blood sugars 1-2 times daily. 100 each 12   hydrochlorothiazide (HYDRODIURIL) 12.5 MG tablet TAKE 1 TABLET BY MOUTH  DAILY 90 tablet 3   Lancets (FREESTYLE) lancets Use to test blood sugars 1-2 times daily. 100 each 12   losartan (COZAAR) 50 MG tablet TAKE 1 TABLET BY MOUTH  DAILY 90 tablet 3   metFORMIN (GLUCOPHAGE-XR) 500 MG 24 hr tablet TAKE 2 TABLETS BY MOUTH  TWICE DAILY 360 tablet 3   metoprolol succinate (TOPROL-XL)  25 MG 24 hr tablet      montelukast (SINGULAIR) 10 MG tablet TAKE 1 TABLET BY MOUTH  DAILY 90 tablet 3   pantoprazole (PROTONIX) 40 MG tablet Take 1 tablet (40 mg total) by mouth 2 (two) times daily before a meal. 180 tablet 1   triamcinolone ointment (KENALOG) 0.1 % Apply 1 application on to the skin 2 times daily 60 g 3   atorvastatin (LIPITOR) 10 MG tablet TAKE 1 TABLET BY MOUTH  DAILY 90 tablet 0   clindamycin (CLEOCIN) 300 MG capsule Take 1 capsule (300 mg total) by mouth 3 (three) times daily until finished. 21 capsule 0   COVID-19 mRNA bivalent vaccine, Pfizer, (PFIZER COVID-19 VAC BIVALENT) injection Inject into the muscle. 0.3 mL 0   meclizine (ANTIVERT) 25 MG tablet Take 1 tablet (25 mg total) by mouth 3 (three) times daily as needed for dizziness. (Patient not taking: Reported on 06/02/2021) 30 tablet 0   No facility-administered medications prior to visit.    Allergies  Allergen Reactions   Lisinopril Cough   Other Rash   Propoxyphene Rash   Penicillins Rash    ROS Review of Systems  Constitutional:  Negative for diaphoresis, fatigue, fever and unexpected weight change.  HENT: Negative.    Eyes:  Negative for photophobia and visual disturbance.  Respiratory: Negative.    Cardiovascular: Negative.   Gastrointestinal: Negative.   Endocrine: Negative for polyphagia and polyuria.  Genitourinary: Negative.   Musculoskeletal:  Negative for gait problem and joint swelling.  Skin:  Negative for pallor and rash.  Neurological:  Negative for speech difficulty and weakness.  Psychiatric/Behavioral: Negative.       Objective:    Physical Exam Vitals and nursing note reviewed.  Constitutional:      General: She is not in acute distress.    Appearance: Normal appearance. She is not ill-appearing, toxic-appearing or diaphoretic.  HENT:     Head: Normocephalic and atraumatic.     Right Ear: External ear normal.     Left Ear: External ear normal.     Mouth/Throat:     Mouth:  Mucous membranes are moist.     Pharynx: Oropharynx is clear. No oropharyngeal exudate or posterior oropharyngeal erythema.  Eyes:     General: No scleral icterus.       Right eye: No discharge.        Left eye: No discharge.     Extraocular Movements: Extraocular movements intact.  Conjunctiva/sclera: Conjunctivae normal.     Pupils: Pupils are equal, round, and reactive to light.  Cardiovascular:     Rate and Rhythm: Normal rate and regular rhythm.     Pulses:          Dorsalis pedis pulses are 2+ on the right side and 2+ on the left side.       Posterior tibial pulses are 1+ on the right side and 1+ on the left side.  Pulmonary:     Effort: Pulmonary effort is normal.     Breath sounds: Normal breath sounds.  Abdominal:     General: Bowel sounds are normal.  Musculoskeletal:     Cervical back: No rigidity or tenderness.     Right lower leg: No edema.     Left lower leg: No edema.  Lymphadenopathy:     Cervical: No cervical adenopathy.  Skin:    General: Skin is warm and dry.  Neurological:     Mental Status: She is alert and oriented to person, place, and time.  Psychiatric:        Mood and Affect: Mood normal.        Behavior: Behavior normal.   Diabetic Foot Exam - Simple   Simple Foot Form Diabetic Foot exam was performed with the following findings: Yes 06/02/2021  2:10 PM  Visual Inspection No deformities, no ulcerations, no other skin breakdown bilaterally: Yes Sensation Testing Intact to touch and monofilament testing bilaterally: Yes Pulse Check Posterior Tibialis and Dorsalis pulse intact bilaterally: Yes Comments     BP 124/78 (BP Location: Left Arm, Patient Position: Sitting, Cuff Size: Large)    Pulse 75    Temp (!) 96.9 F (36.1 C) (Temporal)    Ht 5\' 2"  (1.575 m)    Wt 212 lb 6.4 oz (96.3 kg)    LMP  (LMP Unknown)    SpO2 97%    BMI 38.85 kg/m  Wt Readings from Last 3 Encounters:  06/02/21 212 lb 6.4 oz (96.3 kg)  03/24/21 216 lb 2 oz (98 kg)   12/23/20 216 lb 12.8 oz (98.3 kg)     Health Maintenance Due  Topic Date Due   Pneumococcal Vaccine 28-20 Years old (1 - PCV) Never done   Hepatitis C Screening  Never done   TETANUS/TDAP  12/21/2019    There are no preventive care reminders to display for this patient.  Lab Results  Component Value Date   TSH 2.44 03/24/2021   Lab Results  Component Value Date   WBC 6.4 03/24/2021   HGB 12.5 03/24/2021   HCT 37.9 03/24/2021   MCV 86.8 03/24/2021   PLT 353.0 03/24/2021   Lab Results  Component Value Date   NA 137 04/12/2021   K 4.2 04/12/2021   CO2 26 04/12/2021   GLUCOSE 113 (H) 04/12/2021   BUN 13 04/12/2021   CREATININE 1.04 04/12/2021   BILITOT 0.3 03/24/2021   ALKPHOS 61 03/24/2021   AST 13 03/24/2021   ALT 16 03/24/2021   PROT 7.0 03/24/2021   ALBUMIN 4.0 03/24/2021   CALCIUM 9.1 04/12/2021   ANIONGAP 10 03/07/2019   GFR 58.09 (L) 04/12/2021   Lab Results  Component Value Date   CHOL 137 08/31/2020   Lab Results  Component Value Date   HDL 43.20 08/31/2020   Lab Results  Component Value Date   LDLCALC 76 08/31/2020   Lab Results  Component Value Date   TRIG 92.0 08/31/2020   Lab Results  Component Value Date   CHOLHDL 3 08/31/2020   Lab Results  Component Value Date   HGBA1C 7.3 (H) 12/23/2020      Assessment & Plan:   Problem List Items Addressed This Visit       Cardiovascular and Mediastinum   Essential hypertension   Relevant Medications   atorvastatin (LIPITOR) 20 MG tablet   Other Relevant Orders   Comprehensive metabolic panel   CBC   Urinalysis, Routine w reflex microscopic   Microalbumin / creatinine urine ratio     Digestive   NAFL (nonalcoholic fatty liver)   Relevant Orders   Comprehensive metabolic panel   LDL cholesterol, direct   Lipid panel     Endocrine   Type 2 diabetes mellitus with hyperglycemia, without long-term current use of insulin (HCC)   Relevant Medications   atorvastatin (LIPITOR) 20 MG  tablet   Other Relevant Orders   Comprehensive metabolic panel   CBC   Hemoglobin A1c   Urinalysis, Routine w reflex microscopic   Microalbumin / creatinine urine ratio     Other   Elevated LDL cholesterol level   Relevant Medications   atorvastatin (LIPITOR) 20 MG tablet   Other Relevant Orders   Comprehensive metabolic panel   LDL cholesterol, direct   Lipid panel   Decreased GFR - Primary   Relevant Orders   Comprehensive metabolic panel   Other Visit Diagnoses     Need for pneumococcal vaccination       Relevant Orders   Pneumococcal conjugate vaccine 20-valent (Prevnar 20)       Meds ordered this encounter  Medications   atorvastatin (LIPITOR) 20 MG tablet    Sig: Take 1 tablet (20 mg total) by mouth daily.    Dispense:  90 tablet    Refill:  3    Follow-up: Return in about 3 months (around 08/31/2021).  Discussed adding a GLP-1 agonist to help with weight loss, improved diabetes control as well as improved cardiac outcomes.  Also discussed using an SGLT2 inhibitor for renal protection.  Information was given on both of these medications.  Labs are pending.  Continue all of the medications will need to return fasting.  We will be increasing atorvastatin to 20 mg daily today.  Return fasting for blood work.  Libby Maw, MD

## 2021-06-03 ENCOUNTER — Other Ambulatory Visit: Payer: Self-pay

## 2021-06-03 DIAGNOSIS — E2839 Other primary ovarian failure: Secondary | ICD-10-CM

## 2021-06-03 MED ORDER — ESTRADIOL 0.05 MG/24HR TD PTTW: 1.0000 | MEDICATED_PATCH | TRANSDERMAL | 3 refills | Status: DC

## 2021-06-03 NOTE — Progress Notes (Signed)
Patient request 3 month supply to mail order pharmacy. Kathrene Alu RN

## 2021-06-07 ENCOUNTER — Other Ambulatory Visit: Payer: Self-pay

## 2021-06-07 ENCOUNTER — Other Ambulatory Visit (INDEPENDENT_AMBULATORY_CARE_PROVIDER_SITE_OTHER): Payer: 59

## 2021-06-07 DIAGNOSIS — E7800 Pure hypercholesterolemia, unspecified: Secondary | ICD-10-CM | POA: Diagnosis not present

## 2021-06-07 DIAGNOSIS — R944 Abnormal results of kidney function studies: Secondary | ICD-10-CM | POA: Diagnosis not present

## 2021-06-07 DIAGNOSIS — I10 Essential (primary) hypertension: Secondary | ICD-10-CM | POA: Diagnosis not present

## 2021-06-07 DIAGNOSIS — K760 Fatty (change of) liver, not elsewhere classified: Secondary | ICD-10-CM

## 2021-06-07 DIAGNOSIS — E1165 Type 2 diabetes mellitus with hyperglycemia: Secondary | ICD-10-CM

## 2021-06-07 LAB — LIPID PANEL
Cholesterol: 152 mg/dL (ref 0–200)
HDL: 43 mg/dL (ref >39.00)
LDL Cholesterol: 92 mg/dL (ref 0–99)
NonHDL: 108.6
Total CHOL/HDL Ratio: 4
Triglycerides: 85 mg/dL (ref 0.0–149.0)
VLDL: 17 mg/dL (ref 0.0–40.0)

## 2021-06-07 LAB — MICROALBUMIN / CREATININE URINE RATIO
Creatinine,U: 177.2 mg/dL
Microalb Creat Ratio: 0.7 mg/g (ref 0.0–30.0)
Microalb, Ur: 1.2 mg/dL (ref 0.0–1.9)

## 2021-06-07 LAB — CBC
HCT: 39.5 % (ref 36.0–46.0)
Hemoglobin: 12.7 g/dL (ref 12.0–15.0)
MCHC: 32.1 g/dL (ref 30.0–36.0)
MCV: 87 fl (ref 78.0–100.0)
Platelets: 283 10*3/uL (ref 150.0–400.0)
RBC: 4.54 Mil/uL (ref 3.87–5.11)
RDW: 14.2 % (ref 11.5–15.5)
WBC: 3.9 10*3/uL — ABNORMAL LOW (ref 4.0–10.5)

## 2021-06-07 LAB — COMPREHENSIVE METABOLIC PANEL
ALT: 17 U/L (ref 0–35)
AST: 13 U/L (ref 0–37)
Albumin: 3.9 g/dL (ref 3.5–5.2)
Alkaline Phosphatase: 61 U/L (ref 39–117)
BUN: 14 mg/dL (ref 6–23)
CO2: 27 mEq/L (ref 19–32)
Calcium: 9 mg/dL (ref 8.4–10.5)
Chloride: 104 mEq/L (ref 96–112)
Creatinine, Ser: 0.86 mg/dL (ref 0.40–1.20)
GFR: 72.88 mL/min (ref >60.00)
Glucose, Bld: 120 mg/dL — ABNORMAL HIGH (ref 70–99)
Potassium: 3.8 mEq/L (ref 3.5–5.1)
Sodium: 141 mEq/L (ref 135–145)
Total Bilirubin: 0.3 mg/dL (ref 0.2–1.2)
Total Protein: 6.9 g/dL (ref 6.0–8.3)

## 2021-06-07 LAB — LDL CHOLESTEROL, DIRECT: Direct LDL: 96 mg/dL

## 2021-06-07 LAB — HEMOGLOBIN A1C: Hgb A1c MFr Bld: 7 % — ABNORMAL HIGH (ref 4.6–6.5)

## 2021-06-08 ENCOUNTER — Encounter: Payer: Self-pay | Admitting: Family Medicine

## 2021-06-08 DIAGNOSIS — E1165 Type 2 diabetes mellitus with hyperglycemia: Secondary | ICD-10-CM

## 2021-06-08 MED ORDER — OZEMPIC (0.25 OR 0.5 MG/DOSE) 2 MG/1.5ML ~~LOC~~ SOPN: 0.5000 mg | PEN_INJECTOR | SUBCUTANEOUS | 1 refills | Status: DC

## 2021-06-09 ENCOUNTER — Other Ambulatory Visit (HOSPITAL_BASED_OUTPATIENT_CLINIC_OR_DEPARTMENT_OTHER): Payer: Self-pay

## 2021-06-10 ENCOUNTER — Telehealth (INDEPENDENT_AMBULATORY_CARE_PROVIDER_SITE_OTHER): Payer: 59 | Admitting: Family Medicine

## 2021-06-10 ENCOUNTER — Encounter: Payer: Self-pay | Admitting: Family Medicine

## 2021-06-10 VITALS — Ht 62.0 in | Wt 212.0 lb

## 2021-06-10 DIAGNOSIS — K760 Fatty (change of) liver, not elsewhere classified: Secondary | ICD-10-CM | POA: Diagnosis not present

## 2021-06-10 DIAGNOSIS — E1165 Type 2 diabetes mellitus with hyperglycemia: Secondary | ICD-10-CM

## 2021-06-10 DIAGNOSIS — E7800 Pure hypercholesterolemia, unspecified: Secondary | ICD-10-CM

## 2021-06-10 NOTE — Progress Notes (Signed)
Established Patient Office Visit  Subjective:  Patient ID: Grace Gregory, female    DOB: 07/08/1960  Age: 61 y.o. MRN: [id number]  CC:  Chief Complaint  Patient presents with   Advice Only    Discuss Ozempic just recently prescribed. Should arrival in the mail tomorrow.     HPI Grace Gregory presents for discussion of treatment for diabetes and elevated cholesterol.  Grace Gregory has been compliant with with atorvastatin and taking it every day.  LDL is still mildly elevated.  Type 2 diabetes under reasonable control with current therapy.  GLP-1 agonist could be a welcome addition for improved control and weight loss.  Grace Gregory would like to try it.  Past Medical History:  Diagnosis Date   Arthritis    Asthma    Diabetes ([place])    Elevated cholesterol    GERD (gastroesophageal reflux disease)    Hypertension    Obesity    Seasonal allergies    Sleep apnea    On CPAP Machine     Past Surgical History:  Procedure Laterality Date   ABDOMINAL HYSTERECTOMY     BACK SURGERY     COLONOSCOPY     age 61 in [place] Normal    VULVECTOMY N/A 08/06/2018   Procedure: WIDE LOCAL EXCISION VULVA;  Surgeon: Everitt Amber, MD;  Location: [facility];  Service: Gynecology;  Laterality: N/A;    Family History  Problem Relation Age of Onset   Hypertension Mother    Heart disease Father    Hypertension Sister    Hypertension Brother    Hypertension Sister    Hypertension Sister    Brain cancer Brother    Hypertension Brother    Colon cancer Neg Hx    Esophageal cancer Neg Hx    Stomach cancer Neg Hx    Rectal cancer Neg Hx     Social History   Socioeconomic History   Marital status: Married    Spouse name: Not on file   Number of children: Not on file   Years of education: Not on file   Highest education level: Not on file  Occupational History   Not on file  Tobacco Use   Smoking status: Never   Smokeless tobacco: Never  Vaping Use   Vaping Use:  Never used  Substance and Sexual Activity   Alcohol use: Yes    Comment: occassionally    Drug use: Never   Sexual activity: Yes    Birth control/protection: None  Other Topics Concern   Not on file  Social History Narrative   Not on file   Social Determinants of Health   Financial Resource Strain: Not on file  Food Insecurity: Not on file  Transportation Needs: Not on file  Physical Activity: Not on file  Stress: Not on file  Social Connections: Not on file  Intimate Partner Violence: Not on file    Outpatient Medications Prior to Visit  Medication Sig Dispense Refill   albuterol (VENTOLIN HFA) 108 (90 Base) MCG/ACT inhaler Inhale 2 puffs into the lungs every 4-6 hours as needed for cough/wheeze. 18 g 0   AMBULATORY NON FORMULARY MEDICATION Medication Name: Allergy shot x2 a week     atorvastatin (LIPITOR) 20 MG tablet Take 1 tablet (20 mg total) by mouth daily. 90 tablet 3   AUVI-Q 0.3 MG/0.3ML SOAJ injection      Blood Glucose Monitoring Suppl (FREESTYLE LITE) DEVI Use to test blood sugars 1-2 times daily. 1  each 0   cetirizine (ZYRTEC) 10 MG tablet Take 10 mg by mouth daily.     clobetasol cream (TEMOVATE) 2.84 % Apply 1 application topically 2 (two) times daily. Not for face or private areas. 60 g 0   estradiol (VIVELLE-DOT) 0.05 MG/24HR patch Place 1 patch (0.05 mg total) onto the skin 2 (two) times a week. 24 patch 3   fluticasone (FLONASE) 50 MCG/ACT nasal spray Place 2 sprays into both nostrils daily. 16 g 3   fluticasone (FLOVENT HFA) 110 MCG/ACT inhaler Inhale 2 puffs into the lungs 2 times daily 36 g 3   gabapentin (NEURONTIN) 300 MG capsule TAKE 1 CAPSULE BY MOUTH  TWICE DAILY 180 capsule 3   glucose blood (FREESTYLE LITE) test strip Use to test blood sugars 1-2 times daily. 100 each 12   hydrochlorothiazide (HYDRODIURIL) 12.5 MG tablet TAKE 1 TABLET BY MOUTH  DAILY 90 tablet 3   Lancets (FREESTYLE) lancets Use to test blood sugars 1-2 times daily. 100 each 12    losartan (COZAAR) 50 MG tablet TAKE 1 TABLET BY MOUTH  DAILY 90 tablet 3   metFORMIN (GLUCOPHAGE-XR) 500 MG 24 hr tablet TAKE 2 TABLETS BY MOUTH  TWICE DAILY 360 tablet 3   metoprolol succinate (TOPROL-XL) 25 MG 24 hr tablet      montelukast (SINGULAIR) 10 MG tablet TAKE 1 TABLET BY MOUTH  DAILY 90 tablet 3   pantoprazole (PROTONIX) 40 MG tablet Take 1 tablet (40 mg total) by mouth 2 (two) times daily before a meal. 180 tablet 1   Semaglutide,0.25 or 0.5MG /DOS, (OZEMPIC, 0.25 OR 0.5 MG/DOSE,) 2 MG/1.5ML SOPN Inject 0.5 mg into the skin once a week. 4.5 mL 1   triamcinolone ointment (KENALOG) 0.1 % Apply 1 application on to the skin 2 times daily 60 g 3   No facility-administered medications prior to visit.    Allergies  Allergen Reactions   Lisinopril Cough   Other Rash   Propoxyphene Rash   Penicillins Rash    ROS Review of Systems  Constitutional:  Negative for diaphoresis, fatigue, fever and unexpected weight change.  Respiratory: Negative.    Cardiovascular: Negative.   Gastrointestinal: Negative.   Neurological: Negative.      Objective:    Physical Exam Vitals and nursing note reviewed.  Constitutional:      General: Grace Gregory is not in acute distress.    Appearance: Normal appearance. Grace Gregory is obese. Grace Gregory is not ill-appearing, toxic-appearing or diaphoretic.  HENT:     Head: Normocephalic and atraumatic.  Eyes:     General: No scleral icterus.       Right eye: No discharge.        Left eye: No discharge.  Pulmonary:     Effort: Pulmonary effort is normal.  Neurological:     Mental Status: Grace Gregory is alert and oriented to person, place, and time.  Psychiatric:        Mood and Affect: Mood normal.        Behavior: Behavior normal.    Ht 5\' 2"  (1.575 m)    Wt 212 lb (96.2 kg)    LMP  (LMP Unknown)    BMI 38.78 kg/m  Wt Readings from Last 3 Encounters:  06/10/21 212 lb (96.2 kg)  06/02/21 212 lb 6.4 oz (96.3 kg)  03/24/21 216 lb 2 oz (98 kg)     Health Maintenance Due   Topic Date Due   Hepatitis C Screening  Never done   TETANUS/TDAP  12/21/2019  There are no preventive care reminders to display for this patient.  Lab Results  Component Value Date   TSH 2.44 03/24/2021   Lab Results  Component Value Date   WBC 3.9 (L) 06/07/2021   HGB 12.7 06/07/2021   HCT 39.5 06/07/2021   MCV 87.0 06/07/2021   PLT 283.0 06/07/2021   Lab Results  Component Value Date   NA 141 06/07/2021   K 3.8 06/07/2021   CO2 27 06/07/2021   GLUCOSE 120 (H) 06/07/2021   BUN 14 06/07/2021   CREATININE 0.86 06/07/2021   BILITOT 0.3 06/07/2021   ALKPHOS 61 06/07/2021   AST 13 06/07/2021   ALT 17 06/07/2021   PROT 6.9 06/07/2021   ALBUMIN 3.9 06/07/2021   CALCIUM 9.0 06/07/2021   ANIONGAP 10 03/07/2019   GFR 72.88 06/07/2021   Lab Results  Component Value Date   CHOL 152 06/07/2021   Lab Results  Component Value Date   HDL 43.00 06/07/2021   Lab Results  Component Value Date   LDLCALC 92 06/07/2021   Lab Results  Component Value Date   TRIG 85.0 06/07/2021   Lab Results  Component Value Date   CHOLHDL 4 06/07/2021   Lab Results  Component Value Date   HGBA1C 7.0 (H) 06/07/2021      Assessment & Plan:   Problem List Items Addressed This Visit       Digestive   NAFL (nonalcoholic fatty liver)     Endocrine   Type 2 diabetes mellitus with hyperglycemia, without long-term current use of insulin (HCC) - Primary     Other   Elevated LDL cholesterol level   Morbidly obese (HCC)    No orders of the defined types were placed in this encounter.   Follow-up: Return 24-month follow-up is scheduled..   Will start weekly semaglutide.  Warned about side effects with carbohydrate load.  Discussed thyroid C-type cancer in laboratory mice.  Increased atorvastatin to reach call of LDL of 70 or better. Libby Maw, MD  Virtual Visit via Video Note  I connected with Grace Gregory on 06/10/21 at  4:00 PM EST by a video enabled  telemedicine application and verified that I am speaking with the correct person using two identifiers.  Location: Patient: riding in car with husband.  Provider: work   I discussed the limitations of evaluation and management by telemedicine and the availability of in person appointments. The patient expressed understanding and agreed to proceed.  History of Present Illness:    Observations/Objective:   Assessment and Plan:   Follow Up Instructions:    I discussed the assessment and treatment plan with the patient. The patient was provided an opportunity to ask questions and all were answered. The patient agreed with the plan and demonstrated an understanding of the instructions.   The patient was advised to call back or seek an in-person evaluation if the symptoms worsen or if the condition fails to improve as anticipated.  I provided 20 minutes of non-face-to-face time during this encounter.   Libby Maw, MD

## 2021-06-11 ENCOUNTER — Encounter: Payer: Self-pay | Admitting: Family Medicine

## 2021-06-11 DIAGNOSIS — E1165 Type 2 diabetes mellitus with hyperglycemia: Secondary | ICD-10-CM

## 2021-06-13 ENCOUNTER — Encounter: Payer: Self-pay | Admitting: Family Medicine

## 2021-06-13 ENCOUNTER — Other Ambulatory Visit (HOSPITAL_BASED_OUTPATIENT_CLINIC_OR_DEPARTMENT_OTHER): Payer: Self-pay

## 2021-06-13 MED ORDER — OZEMPIC (0.25 OR 0.5 MG/DOSE) 2 MG/1.5ML ~~LOC~~ SOPN: 0.5000 mg | PEN_INJECTOR | SUBCUTANEOUS | 5 refills | Status: DC

## 2021-06-14 ENCOUNTER — Ambulatory Visit: Payer: 59 | Admitting: Family Medicine

## 2021-06-15 ENCOUNTER — Other Ambulatory Visit (HOSPITAL_BASED_OUTPATIENT_CLINIC_OR_DEPARTMENT_OTHER): Payer: Self-pay

## 2021-06-15 ENCOUNTER — Other Ambulatory Visit: Payer: Self-pay

## 2021-06-15 ENCOUNTER — Encounter: Payer: Self-pay | Admitting: Obstetrics & Gynecology

## 2021-06-15 ENCOUNTER — Other Ambulatory Visit: Payer: Self-pay | Admitting: Obstetrics & Gynecology

## 2021-06-15 DIAGNOSIS — E2839 Other primary ovarian failure: Secondary | ICD-10-CM

## 2021-06-15 DIAGNOSIS — E1165 Type 2 diabetes mellitus with hyperglycemia: Secondary | ICD-10-CM

## 2021-06-15 MED ORDER — OZEMPIC (0.25 OR 0.5 MG/DOSE) 2 MG/1.5ML ~~LOC~~ SOPN
PEN_INJECTOR | SUBCUTANEOUS | 5 refills | Status: DC
  Filled 2021-06-15: qty 1.5, 31d supply, fill #0

## 2021-06-15 MED ORDER — ESTRADIOL 0.05 MG/24HR TD PTTW
1.0000 | MEDICATED_PATCH | TRANSDERMAL | 3 refills | Status: DC
  Filled 2021-06-15: qty 8, 28d supply, fill #0
  Filled 2021-07-25: qty 24, 84d supply, fill #0

## 2021-06-15 MED ORDER — OZEMPIC (0.25 OR 0.5 MG/DOSE) 2 MG/1.5ML ~~LOC~~ SOPN
PEN_INJECTOR | SUBCUTANEOUS | 5 refills | Status: DC
  Filled 2021-06-15: qty 1.5, fill #0

## 2021-06-15 MED ORDER — OZEMPIC (0.25 OR 0.5 MG/DOSE) 2 MG/1.5ML ~~LOC~~ SOPN: 0.5000 mg | PEN_INJECTOR | SUBCUTANEOUS | 5 refills | Status: DC

## 2021-06-15 MED ORDER — OZEMPIC (0.25 OR 0.5 MG/DOSE) 2 MG/1.5ML ~~LOC~~ SOPN
PEN_INJECTOR | SUBCUTANEOUS | 5 refills | Status: DC
  Filled 2021-06-15: qty 1.5, 28d supply, fill #0

## 2021-06-16 ENCOUNTER — Other Ambulatory Visit (HOSPITAL_BASED_OUTPATIENT_CLINIC_OR_DEPARTMENT_OTHER): Payer: Self-pay

## 2021-06-16 MED ORDER — SEMAGLUTIDE(0.25 OR 0.5MG/DOS) 2 MG/1.5ML ~~LOC~~ SOPN
PEN_INJECTOR | SUBCUTANEOUS | 0 refills | Status: DC
  Filled 2021-06-16: qty 3.75, 84d supply, fill #0

## 2021-06-16 NOTE — Addendum Note (Signed)
Addended by: Jon Billings on: 06/16/2021 05:31 PM   Modules accepted: Orders

## 2021-06-21 ENCOUNTER — Other Ambulatory Visit: Payer: Self-pay

## 2021-07-12 ENCOUNTER — Other Ambulatory Visit: Payer: Self-pay | Admitting: Family Medicine

## 2021-07-25 ENCOUNTER — Encounter: Payer: Self-pay | Admitting: Family Medicine

## 2021-07-25 ENCOUNTER — Other Ambulatory Visit (HOSPITAL_BASED_OUTPATIENT_CLINIC_OR_DEPARTMENT_OTHER): Payer: Self-pay

## 2021-07-25 DIAGNOSIS — E1165 Type 2 diabetes mellitus with hyperglycemia: Secondary | ICD-10-CM

## 2021-07-25 MED ORDER — TRULICITY 0.75 MG/0.5ML ~~LOC~~ SOAJ
0.7500 mg | SUBCUTANEOUS | 2 refills | Status: DC
  Filled 2021-07-25: qty 2, 28d supply, fill #0

## 2021-07-26 ENCOUNTER — Other Ambulatory Visit (HOSPITAL_BASED_OUTPATIENT_CLINIC_OR_DEPARTMENT_OTHER): Payer: Self-pay

## 2021-07-26 ENCOUNTER — Encounter: Payer: Self-pay | Admitting: Family Medicine

## 2021-07-27 ENCOUNTER — Other Ambulatory Visit (HOSPITAL_BASED_OUTPATIENT_CLINIC_OR_DEPARTMENT_OTHER): Payer: Self-pay

## 2021-07-28 ENCOUNTER — Other Ambulatory Visit (HOSPITAL_BASED_OUTPATIENT_CLINIC_OR_DEPARTMENT_OTHER): Payer: Self-pay

## 2021-07-29 ENCOUNTER — Other Ambulatory Visit (HOSPITAL_BASED_OUTPATIENT_CLINIC_OR_DEPARTMENT_OTHER): Payer: Self-pay

## 2021-08-01 ENCOUNTER — Other Ambulatory Visit (HOSPITAL_BASED_OUTPATIENT_CLINIC_OR_DEPARTMENT_OTHER): Payer: Self-pay

## 2021-08-02 ENCOUNTER — Other Ambulatory Visit (HOSPITAL_BASED_OUTPATIENT_CLINIC_OR_DEPARTMENT_OTHER): Payer: Self-pay

## 2021-08-03 ENCOUNTER — Other Ambulatory Visit (HOSPITAL_BASED_OUTPATIENT_CLINIC_OR_DEPARTMENT_OTHER): Payer: Self-pay

## 2021-08-04 ENCOUNTER — Other Ambulatory Visit (HOSPITAL_BASED_OUTPATIENT_CLINIC_OR_DEPARTMENT_OTHER): Payer: Self-pay

## 2021-08-05 ENCOUNTER — Other Ambulatory Visit (HOSPITAL_BASED_OUTPATIENT_CLINIC_OR_DEPARTMENT_OTHER): Payer: Self-pay

## 2021-08-09 ENCOUNTER — Other Ambulatory Visit (HOSPITAL_BASED_OUTPATIENT_CLINIC_OR_DEPARTMENT_OTHER): Payer: Self-pay

## 2021-08-09 NOTE — Telephone Encounter (Signed)
Spoke with pharmacist no PA needed pt have picked up RX ?

## 2021-08-17 ENCOUNTER — Other Ambulatory Visit (HOSPITAL_BASED_OUTPATIENT_CLINIC_OR_DEPARTMENT_OTHER): Payer: Self-pay

## 2021-08-21 ENCOUNTER — Encounter: Payer: Self-pay | Admitting: Family Medicine

## 2021-08-22 ENCOUNTER — Other Ambulatory Visit (HOSPITAL_BASED_OUTPATIENT_CLINIC_OR_DEPARTMENT_OTHER): Payer: Self-pay

## 2021-08-22 ENCOUNTER — Other Ambulatory Visit: Payer: Self-pay

## 2021-08-22 DIAGNOSIS — E1165 Type 2 diabetes mellitus with hyperglycemia: Secondary | ICD-10-CM

## 2021-08-22 MED ORDER — TRULICITY 0.75 MG/0.5ML ~~LOC~~ SOAJ
0.7500 mg | SUBCUTANEOUS | 0 refills | Status: DC
  Filled 2021-08-22 – 2021-08-26 (×2): qty 6, 84d supply, fill #0

## 2021-08-23 ENCOUNTER — Other Ambulatory Visit (HOSPITAL_BASED_OUTPATIENT_CLINIC_OR_DEPARTMENT_OTHER): Payer: Self-pay

## 2021-08-26 ENCOUNTER — Other Ambulatory Visit (HOSPITAL_BASED_OUTPATIENT_CLINIC_OR_DEPARTMENT_OTHER): Payer: Self-pay

## 2021-08-29 ENCOUNTER — Ambulatory Visit: Payer: Commercial Managed Care - PPO | Admitting: Family Medicine

## 2021-08-29 ENCOUNTER — Encounter: Payer: Self-pay | Admitting: Family Medicine

## 2021-08-29 VITALS — BP 126/76 | HR 77 | Temp 97.1°F | Ht 62.0 in | Wt 205.2 lb

## 2021-08-29 DIAGNOSIS — E1165 Type 2 diabetes mellitus with hyperglycemia: Secondary | ICD-10-CM

## 2021-08-29 DIAGNOSIS — M5126 Other intervertebral disc displacement, lumbar region: Secondary | ICD-10-CM | POA: Insufficient documentation

## 2021-08-29 DIAGNOSIS — L209 Atopic dermatitis, unspecified: Secondary | ICD-10-CM | POA: Insufficient documentation

## 2021-08-29 DIAGNOSIS — E7800 Pure hypercholesterolemia, unspecified: Secondary | ICD-10-CM

## 2021-08-29 DIAGNOSIS — N644 Mastodynia: Secondary | ICD-10-CM | POA: Insufficient documentation

## 2021-08-29 DIAGNOSIS — M5416 Radiculopathy, lumbar region: Secondary | ICD-10-CM | POA: Insufficient documentation

## 2021-08-29 DIAGNOSIS — R059 Cough, unspecified: Secondary | ICD-10-CM | POA: Insufficient documentation

## 2021-08-29 DIAGNOSIS — I10 Essential (primary) hypertension: Secondary | ICD-10-CM | POA: Diagnosis not present

## 2021-08-29 DIAGNOSIS — J4540 Moderate persistent asthma, uncomplicated: Secondary | ICD-10-CM | POA: Insufficient documentation

## 2021-08-29 DIAGNOSIS — J309 Allergic rhinitis, unspecified: Secondary | ICD-10-CM | POA: Insufficient documentation

## 2021-08-29 DIAGNOSIS — J3081 Allergic rhinitis due to animal (cat) (dog) hair and dander: Secondary | ICD-10-CM | POA: Insufficient documentation

## 2021-08-29 DIAGNOSIS — J301 Allergic rhinitis due to pollen: Secondary | ICD-10-CM | POA: Insufficient documentation

## 2021-08-29 NOTE — Progress Notes (Signed)
? ?Established Patient Office Visit ? ?Subjective:  ?Patient ID: Grace Gregory, female    DOB: November 02, 1960  Age: 61 y.o. MRN: [id number] ? ?CC:  ?Chief Complaint  ?Patient presents with  ? Follow-up  ?  3 month follow up, no concerns.   ? ? ?HPI ?Grace Gregory presents for follow-up of hypertension, diabetes, elevated cholesterol and obesity.  Doing well with Trulicity.  Much more tolerable than semaglutide.  Doing well with atorvastatin at 20 mg.  Blood pressure controlled with HCTZ, metoprolol and losartan.   ? ? ? ?Past Medical History:  ?Diagnosis Date  ? Arthritis   ? Asthma   ? Diabetes ([place])   ? Elevated cholesterol   ? GERD (gastroesophageal reflux disease)   ? Hypertension   ? Obesity   ? Seasonal allergies   ? Sleep apnea   ? On CPAP Machine   ? ? ?Past Surgical History:  ?Procedure Laterality Date  ? ABDOMINAL HYSTERECTOMY    ? BACK SURGERY    ? COLONOSCOPY    ? age 27 in [place]. Normal   ? VULVECTOMY N/A 08/06/2018  ? Procedure: WIDE LOCAL EXCISION VULVA;  Surgeon: Everitt Amber, MD;  Location: [facility];  Service: Gynecology;  Laterality: N/A;  ? ? ?Family History  ?Problem Relation Age of Onset  ? Hypertension Mother   ? Heart disease Father   ? Hypertension Sister   ? Hypertension Brother   ? Hypertension Sister   ? Hypertension Sister   ? Brain cancer Brother   ? Hypertension Brother   ? Colon cancer Neg Hx   ? Esophageal cancer Neg Hx   ? Stomach cancer Neg Hx   ? Rectal cancer Neg Hx   ? ? ?Social History  ? ?Socioeconomic History  ? Marital status: Married  ?  Spouse name: Not on file  ? Number of children: Not on file  ? Years of education: Not on file  ? Highest education level: Not on file  ?Occupational History  ? Not on file  ?Tobacco Use  ? Smoking status: Never  ? Smokeless tobacco: Never  ?Vaping Use  ? Vaping Use: Never used  ?Substance and Sexual Activity  ? Alcohol use: Yes  ?  Comment: occassionally   ? Drug use: Never  ? Sexual activity: Yes  ?  Birth  control/protection: None  ?Other Topics Concern  ? Not on file  ?Social History Narrative  ? Not on file  ? ?Social Determinants of Health  ? ?Financial Resource Strain: Not on file  ?Food Insecurity: Not on file  ?Transportation Needs: Not on file  ?Physical Activity: Not on file  ?Stress: Not on file  ?Social Connections: Not on file  ?Intimate Partner Violence: Not on file  ? ? ?Outpatient Medications Prior to Visit  ?Medication Sig Dispense Refill  ? albuterol (VENTOLIN HFA) 108 (90 Base) MCG/ACT inhaler Inhale 2 puffs into the lungs every 4-6 hours as needed for cough/wheeze. 18 g 0  ? AMBULATORY NON FORMULARY MEDICATION Medication Name: Allergy shot x2 a week    ? atorvastatin (LIPITOR) 20 MG tablet Take 1 tablet (20 mg total) by mouth daily. 90 tablet 3  ? AUVI-Q 0.3 MG/0.3ML SOAJ injection     ? Blood Glucose Monitoring Suppl (FREESTYLE LITE) DEVI Use to test blood sugars 1-2 times daily. 1 each 0  ? cetirizine (ZYRTEC) 10 MG tablet Take 10 mg by mouth daily.    ? clobetasol cream (TEMOVATE) 0.05 %  Apply 1 application topically 2 (two) times daily. Not for face or private areas. 60 g 0  ? Dulaglutide (TRULICITY) 5.78 IO/9.6EX SOPN Inject 0.75 mg into the skin once a week. 6 mL 0  ? estradiol (VIVELLE-DOT) 0.05 MG/24HR patch Place 1 patch (0.05 mg total) onto the skin 2 (two) times a week. 24 patch 3  ? fluticasone (FLONASE) 50 MCG/ACT nasal spray Place 2 sprays into both nostrils daily. 16 g 3  ? fluticasone (FLOVENT HFA) 110 MCG/ACT inhaler Inhale 2 puffs into the lungs 2 times daily 36 g 3  ? gabapentin (NEURONTIN) 300 MG capsule TAKE 1 CAPSULE BY MOUTH  TWICE DAILY 180 capsule 3  ? glucose blood (FREESTYLE LITE) test strip Use to test blood sugars 1-2 times daily. 100 each 12  ? hydrochlorothiazide (HYDRODIURIL) 12.5 MG tablet TAKE 1 TABLET BY MOUTH  DAILY 90 tablet 3  ? Lancets (FREESTYLE) lancets Use to test blood sugars 1-2 times daily. 100 each 12  ? losartan (COZAAR) 50 MG tablet TAKE 1 TABLET BY  MOUTH  DAILY 90 tablet 3  ? metFORMIN (GLUCOPHAGE-XR) 500 MG 24 hr tablet TAKE 2 TABLETS BY MOUTH  TWICE DAILY 360 tablet 3  ? metoprolol succinate (TOPROL-XL) 25 MG 24 hr tablet TAKE 1 TABLET BY MOUTH  DAILY 90 tablet 3  ? montelukast (SINGULAIR) 10 MG tablet TAKE 1 TABLET BY MOUTH  DAILY 90 tablet 3  ? pantoprazole (PROTONIX) 40 MG tablet Take 1 tablet (40 mg total) by mouth 2 (two) times daily before a meal. 180 tablet 1  ? triamcinolone ointment (KENALOG) 0.1 % Apply 1 application on to the skin 2 times daily 60 g 3  ? ?No facility-administered medications prior to visit.  ? ? ?Allergies  ?Allergen Reactions  ? Lisinopril Cough  ? Other Rash  ? Propoxyphene Rash  ? Penicillins Rash  ? ? ?ROS ?Review of Systems  ?Constitutional: Negative.   ?HENT: Negative.    ?Eyes:  Negative for photophobia and visual disturbance.  ?Respiratory: Negative.    ?Cardiovascular: Negative.   ?Gastrointestinal: Negative.   ?Endocrine: Negative for polyphagia and polyuria.  ?Genitourinary: Negative.   ?Musculoskeletal:  Negative for gait problem and joint swelling.  ?Skin: Negative.   ?Neurological:  Negative for speech difficulty and weakness.  ? ?  ?Objective:  ?  ?Physical Exam ?Vitals and nursing note reviewed.  ?Constitutional:   ?   General: She is not in acute distress. ?   Appearance: Normal appearance. She is not ill-appearing, toxic-appearing or diaphoretic.  ?HENT:  ?   Head: Normocephalic and atraumatic.  ?   Right Ear: Tympanic membrane, ear canal and external ear normal.  ?   Left Ear: Tympanic membrane, ear canal and external ear normal.  ?   Mouth/Throat:  ?   Mouth: Mucous membranes are moist.  ?   Pharynx: Oropharynx is clear. No oropharyngeal exudate or posterior oropharyngeal erythema.  ?Eyes:  ?   General: No scleral icterus.    ?   Right eye: No discharge.     ?   Left eye: No discharge.  ?   Extraocular Movements: Extraocular movements intact.  ?   Conjunctiva/sclera: Conjunctivae normal.  ?   Pupils: Pupils are  equal, round, and reactive to light.  ?Cardiovascular:  ?   Rate and Rhythm: Normal rate and regular rhythm.  ?Pulmonary:  ?   Effort: Pulmonary effort is normal.  ?   Breath sounds: Normal breath sounds.  ?Musculoskeletal:  ?  Cervical back: No rigidity or tenderness.  ?   Right lower leg: No edema.  ?   Left lower leg: No edema.  ?Lymphadenopathy:  ?   Cervical: No cervical adenopathy.  ?Skin: ?   General: Skin is warm and dry.  ?Neurological:  ?   Mental Status: She is alert and oriented to person, place, and time.  ?Psychiatric:     ?   Mood and Affect: Mood normal.     ?   Behavior: Behavior normal.  ? ? ?BP 126/76 (BP Location: Right Arm, Patient Position: Sitting, Cuff Size: Large)   Pulse 77   Temp (!) 97.1 ?F (36.2 ?C) (Temporal)   Ht '5\' 2"'$  (1.575 m)   Wt 205 lb 3.2 oz (93.1 kg)   LMP  (LMP Unknown)   SpO2 95%   BMI 37.53 kg/m?  ?Wt Readings from Last 3 Encounters:  ?08/29/21 205 lb 3.2 oz (93.1 kg)  ?06/10/21 212 lb (96.2 kg)  ?06/02/21 212 lb 6.4 oz (96.3 kg)  ? ? ? ?Health Maintenance Due  ?Topic Date Due  ? Hepatitis C Screening  Never done  ? ? ?There are no preventive care reminders to display for this patient. ? ?Lab Results  ?Component Value Date  ? TSH 2.44 03/24/2021  ? ?Lab Results  ?Component Value Date  ? WBC 3.9 (L) 06/07/2021  ? HGB 12.7 06/07/2021  ? HCT 39.5 06/07/2021  ? MCV 87.0 06/07/2021  ? PLT 283.0 06/07/2021  ? ?Lab Results  ?Component Value Date  ? NA 141 06/07/2021  ? K 3.8 06/07/2021  ? CO2 27 06/07/2021  ? GLUCOSE 120 (H) 06/07/2021  ? BUN 14 06/07/2021  ? CREATININE 0.86 06/07/2021  ? BILITOT 0.3 06/07/2021  ? ALKPHOS 61 06/07/2021  ? AST 13 06/07/2021  ? ALT 17 06/07/2021  ? PROT 6.9 06/07/2021  ? ALBUMIN 3.9 06/07/2021  ? CALCIUM 9.0 06/07/2021  ? ANIONGAP 10 03/07/2019  ? GFR 72.88 06/07/2021  ? ?Lab Results  ?Component Value Date  ? CHOL 152 06/07/2021  ? ?Lab Results  ?Component Value Date  ? HDL 43.00 06/07/2021  ? ?Lab Results  ?Component Value Date  ? [place] 92  06/07/2021  ? ?Lab Results  ?Component Value Date  ? TRIG 85.0 06/07/2021  ? ?Lab Results  ?Component Value Date  ? CHOLHDL 4 06/07/2021  ? ?Lab Results  ?Component Value Date  ? HGBA1C 7.0 (H) 06/07/2021  ? ?

## 2021-08-30 ENCOUNTER — Other Ambulatory Visit: Payer: Commercial Managed Care - PPO

## 2021-09-01 ENCOUNTER — Ambulatory Visit: Payer: 59 | Admitting: Family Medicine

## 2021-09-07 ENCOUNTER — Other Ambulatory Visit: Payer: Commercial Managed Care - PPO

## 2021-09-08 ENCOUNTER — Other Ambulatory Visit (INDEPENDENT_AMBULATORY_CARE_PROVIDER_SITE_OTHER): Payer: Commercial Managed Care - PPO

## 2021-09-08 DIAGNOSIS — E7800 Pure hypercholesterolemia, unspecified: Secondary | ICD-10-CM | POA: Diagnosis not present

## 2021-09-08 DIAGNOSIS — E1165 Type 2 diabetes mellitus with hyperglycemia: Secondary | ICD-10-CM | POA: Diagnosis not present

## 2021-09-08 LAB — BASIC METABOLIC PANEL
BUN: 12 mg/dL (ref 6–23)
CO2: 27 mEq/L (ref 19–32)
Calcium: 9 mg/dL (ref 8.4–10.5)
Chloride: 103 mEq/L (ref 96–112)
Creatinine, Ser: 0.86 mg/dL (ref 0.40–1.20)
GFR: 72.75 mL/min (ref >60.00)
Glucose, Bld: 96 mg/dL (ref 70–99)
Potassium: 3.8 mEq/L (ref 3.5–5.1)
Sodium: 140 mEq/L (ref 135–145)

## 2021-09-08 LAB — LIPID PANEL
Cholesterol: 134 mg/dL (ref 0–200)
HDL: 44.1 mg/dL (ref >39.00)
LDL Cholesterol: 66 mg/dL (ref 0–99)
NonHDL: 89.62
Total CHOL/HDL Ratio: 3
Triglycerides: 118 mg/dL (ref 0.0–149.0)
VLDL: 23.6 mg/dL (ref 0.0–40.0)

## 2021-09-08 LAB — HEMOGLOBIN A1C: Hgb A1c MFr Bld: 6.5 % (ref 4.6–6.5)

## 2021-09-08 LAB — LDL CHOLESTEROL, DIRECT: Direct LDL: 75 mg/dL

## 2021-09-22 ENCOUNTER — Other Ambulatory Visit: Payer: Self-pay | Admitting: Family Medicine

## 2021-09-29 ENCOUNTER — Other Ambulatory Visit (HOSPITAL_BASED_OUTPATIENT_CLINIC_OR_DEPARTMENT_OTHER): Payer: Self-pay

## 2021-09-29 MED ORDER — CETIRIZINE HCL 10 MG PO TABS
ORAL_TABLET | ORAL | 6 refills | Status: DC
  Filled 2021-09-29: qty 100, 100d supply, fill #0

## 2021-10-03 ENCOUNTER — Other Ambulatory Visit (HOSPITAL_BASED_OUTPATIENT_CLINIC_OR_DEPARTMENT_OTHER): Payer: Self-pay

## 2021-11-03 ENCOUNTER — Other Ambulatory Visit (HOSPITAL_BASED_OUTPATIENT_CLINIC_OR_DEPARTMENT_OTHER): Payer: Self-pay

## 2021-11-03 ENCOUNTER — Other Ambulatory Visit: Payer: Self-pay | Admitting: Family Medicine

## 2021-11-03 DIAGNOSIS — E1165 Type 2 diabetes mellitus with hyperglycemia: Secondary | ICD-10-CM

## 2021-11-03 MED ORDER — TRULICITY 0.75 MG/0.5ML ~~LOC~~ SOAJ
0.7500 mg | SUBCUTANEOUS | 0 refills | Status: DC
  Filled 2021-11-03: qty 6, 84d supply, fill #0

## 2021-11-04 ENCOUNTER — Other Ambulatory Visit (HOSPITAL_BASED_OUTPATIENT_CLINIC_OR_DEPARTMENT_OTHER): Payer: Self-pay

## 2021-11-04 MED ORDER — PREDNISONE 5 MG PO TABS
ORAL_TABLET | ORAL | 1 refills | Status: DC
  Filled 2021-11-04: qty 21, 6d supply, fill #0

## 2021-11-11 ENCOUNTER — Other Ambulatory Visit (HOSPITAL_BASED_OUTPATIENT_CLINIC_OR_DEPARTMENT_OTHER): Payer: Self-pay

## 2021-11-18 ENCOUNTER — Other Ambulatory Visit (HOSPITAL_BASED_OUTPATIENT_CLINIC_OR_DEPARTMENT_OTHER): Payer: Self-pay

## 2021-11-18 MED ORDER — CEFUROXIME AXETIL 250 MG PO TABS
ORAL_TABLET | ORAL | 1 refills | Status: DC
  Filled 2021-11-18: qty 20, 10d supply, fill #0

## 2021-11-18 MED ORDER — FLUCONAZOLE 100 MG PO TABS
ORAL_TABLET | ORAL | 2 refills | Status: DC
  Filled 2021-12-05: qty 3, 9d supply, fill #0

## 2021-11-18 MED ORDER — PREDNISONE 5 MG (21) PO TBPK: ORAL_TABLET | ORAL | 1 refills | Status: DC

## 2021-11-23 ENCOUNTER — Other Ambulatory Visit: Payer: Self-pay | Admitting: Family Medicine

## 2021-11-24 ENCOUNTER — Encounter: Payer: Self-pay | Admitting: Family Medicine

## 2021-11-24 ENCOUNTER — Other Ambulatory Visit (HOSPITAL_BASED_OUTPATIENT_CLINIC_OR_DEPARTMENT_OTHER): Payer: Self-pay

## 2021-11-24 ENCOUNTER — Ambulatory Visit (INDEPENDENT_AMBULATORY_CARE_PROVIDER_SITE_OTHER): Payer: Commercial Managed Care - PPO | Admitting: Family Medicine

## 2021-11-24 VITALS — BP 143/87 | HR 83 | Temp 96.9°F | Ht 62.0 in | Wt 203.2 lb

## 2021-11-24 DIAGNOSIS — J4541 Moderate persistent asthma with (acute) exacerbation: Secondary | ICD-10-CM

## 2021-11-24 MED ORDER — HYDROCODONE BIT-HOMATROP MBR 5-1.5 MG/5ML PO SOLN
ORAL | 0 refills | Status: DC
  Filled 2021-11-24: qty 60, 12d supply, fill #0

## 2021-11-24 MED ORDER — METHYLPREDNISOLONE ACETATE 80 MG/ML IJ SUSP: 80.0000 mg | Freq: Once | INTRAMUSCULAR | Status: DC

## 2021-11-24 NOTE — Progress Notes (Signed)
Established Patient Office Visit  Subjective   Patient ID: Grace Gregory, female    DOB: 19-Jul-1960  Age: 61 y.o. MRN: [id number]  Chief Complaint  Patient presents with   Acute Visit    Coughing / chest pain x 1 month Says her being off her allergy meds since 10/28/21 Coughing up "green stuff"    HPI 1 month history of cough associated with tightness in the chest and wheezing occasionally productive of purulent phlegm.  She has had upper respiratory tract congestion with nasal congestion and postnasal drip.  There has been no fevers or chills.  She was seen in new allergist and had been advised to discontinue all of her medicines so she could be retested.  She was able to have the skin testing and allergy shots are pending.  She is on her second round of antibiotics for this.  And has had to 6-day courses of prednisone without much relief.  She does not want to take prednisone again.  Requests cough syrup for sleep.    Review of Systems  Constitutional:  Negative for chills, diaphoresis, malaise/fatigue and weight loss.  HENT:  Positive for congestion. Negative for sinus pain and sore throat.   Eyes: Negative.  Negative for blurred vision and double vision.  Respiratory:  Positive for cough and wheezing. Negative for shortness of breath.   Cardiovascular:  Negative for chest pain.  Gastrointestinal:  Negative for abdominal pain.  Genitourinary: Negative.   Musculoskeletal:  Negative for falls and myalgias.  Neurological:  Negative for speech change, loss of consciousness and weakness.  Psychiatric/Behavioral: Negative.        Objective:     BP (!) 143/87 (BP Location: Right Arm, Patient Position: Sitting, Cuff Size: Normal)   Pulse 83   Temp (!) 96.9 F (36.1 C) (Temporal)   Ht '5\' 2"'$  (1.575 m)   Wt 203 lb 3.2 oz (92.2 kg)   LMP  (LMP Unknown)   SpO2 94%   BMI 37.17 kg/m    Physical Exam Constitutional:      General: She is not in acute distress.    Appearance:  Normal appearance. She is not ill-appearing, toxic-appearing or diaphoretic.  HENT:     Head: Normocephalic and atraumatic.     Right Ear: External ear normal.     Left Ear: External ear normal.     Mouth/Throat:     Mouth: Mucous membranes are moist.     Pharynx: Oropharynx is clear. No oropharyngeal exudate or posterior oropharyngeal erythema.  Eyes:     General: No scleral icterus.       Right eye: No discharge.        Left eye: No discharge.     Extraocular Movements: Extraocular movements intact.     Conjunctiva/sclera: Conjunctivae normal.     Pupils: Pupils are equal, round, and reactive to light.  Cardiovascular:     Rate and Rhythm: Normal rate and regular rhythm.  Pulmonary:     Effort: Pulmonary effort is normal. No respiratory distress.     Breath sounds: Normal breath sounds. No wheezing or rales.  Musculoskeletal:     Cervical back: No rigidity or tenderness.  Skin:    General: Skin is warm and dry.  Neurological:     Mental Status: She is alert and oriented to person, place, and time.  Psychiatric:        Mood and Affect: Mood normal.        Behavior: Behavior normal.  No results found for any visits on 11/24/21.    The 10-year ASCVD risk score (Arnett DK, et al., 2019) is: 17.3%    Assessment & Plan:   Problem List Items Addressed This Visit   None Visit Diagnoses     Moderate persistent asthmatic bronchitis with acute exacerbation    -  Primary   Relevant Medications   methylPREDNISolone acetate (DEPO-MEDROL) injection 80 mg (Start on 11/24/2021  3:00 PM)   HYDROcodone bit-homatropine (HYDROMET) 5-1.5 MG/5ML syrup       No follow-ups on file.  Complete antibiotic course.  Warned that Depo-Medrol will take a day or 2 to help her symptoms.  Hydromet at night.  She has follow-up scheduled with her allergist.  Libby Maw, MD

## 2021-11-25 ENCOUNTER — Other Ambulatory Visit (HOSPITAL_BASED_OUTPATIENT_CLINIC_OR_DEPARTMENT_OTHER): Payer: Self-pay

## 2021-11-25 MED ORDER — DOXYCYCLINE MONOHYDRATE 100 MG PO CAPS
ORAL_CAPSULE | ORAL | 0 refills | Status: DC
  Filled 2021-11-25: qty 20, 10d supply, fill #0

## 2021-11-29 ENCOUNTER — Ambulatory Visit: Payer: Commercial Managed Care - PPO | Admitting: Family Medicine

## 2021-12-01 ENCOUNTER — Encounter: Payer: Self-pay | Admitting: Family Medicine

## 2021-12-01 DIAGNOSIS — J4521 Mild intermittent asthma with (acute) exacerbation: Secondary | ICD-10-CM

## 2021-12-01 DIAGNOSIS — J9801 Acute bronchospasm: Secondary | ICD-10-CM

## 2021-12-02 ENCOUNTER — Other Ambulatory Visit (HOSPITAL_BASED_OUTPATIENT_CLINIC_OR_DEPARTMENT_OTHER): Payer: Self-pay

## 2021-12-02 MED ORDER — BENZONATATE 200 MG PO CAPS
200.0000 mg | ORAL_CAPSULE | Freq: Two times a day (BID) | ORAL | 0 refills | Status: DC | PRN
  Filled 2021-12-02: qty 20, 10d supply, fill #0

## 2021-12-04 ENCOUNTER — Encounter: Payer: Self-pay | Admitting: Family Medicine

## 2021-12-05 ENCOUNTER — Other Ambulatory Visit: Payer: Self-pay

## 2021-12-05 ENCOUNTER — Other Ambulatory Visit (HOSPITAL_BASED_OUTPATIENT_CLINIC_OR_DEPARTMENT_OTHER): Payer: Self-pay

## 2021-12-05 DIAGNOSIS — K219 Gastro-esophageal reflux disease without esophagitis: Secondary | ICD-10-CM

## 2021-12-05 DIAGNOSIS — R079 Chest pain, unspecified: Secondary | ICD-10-CM

## 2021-12-05 MED ORDER — PANTOPRAZOLE SODIUM 40 MG PO TBEC: DELAYED_RELEASE_TABLET | ORAL | 0 refills | Status: DC

## 2021-12-05 MED ORDER — PANTOPRAZOLE SODIUM 40 MG PO TBEC
80.0000 mg | DELAYED_RELEASE_TABLET | Freq: Every day | ORAL | 1 refills | Status: DC
  Filled 2021-12-05: qty 180, 90d supply, fill #0
  Filled 2022-03-06: qty 180, 90d supply, fill #1

## 2021-12-05 MED ORDER — AZITHROMYCIN 250 MG PO TABS
ORAL_TABLET | ORAL | 0 refills | Status: DC
  Filled 2021-12-05: qty 6, 5d supply, fill #0

## 2021-12-05 MED ORDER — PREDNISONE 20 MG PO TABS
ORAL_TABLET | ORAL | 0 refills | Status: DC
  Filled 2021-12-05: qty 9, 3d supply, fill #0

## 2021-12-13 ENCOUNTER — Other Ambulatory Visit (HOSPITAL_BASED_OUTPATIENT_CLINIC_OR_DEPARTMENT_OTHER): Payer: Self-pay

## 2021-12-13 MED ORDER — ALBUTEROL SULFATE HFA 108 (90 BASE) MCG/ACT IN AERS
INHALATION_SPRAY | RESPIRATORY_TRACT | 0 refills | Status: DC
  Filled 2021-12-13: qty 8.5, 17d supply, fill #0

## 2021-12-13 MED ORDER — FLUTICASONE PROPIONATE HFA 110 MCG/ACT IN AERO
INHALATION_SPRAY | RESPIRATORY_TRACT | 3 refills | Status: DC
Start: 2021-12-13 — End: 2022-05-02
  Filled 2021-12-13: qty 36, 90d supply, fill #0

## 2021-12-13 MED ORDER — CETIRIZINE HCL 10 MG PO TABS
ORAL_TABLET | ORAL | 5 refills | Status: AC
  Filled 2021-12-13: qty 100, 100d supply, fill #0

## 2021-12-13 MED ORDER — AZELASTINE HCL 0.1 % NA SOLN
NASAL | 5 refills | Status: AC
  Filled 2021-12-13: qty 30, 25d supply, fill #0

## 2021-12-13 MED ORDER — MONTELUKAST SODIUM 10 MG PO TABS
ORAL_TABLET | ORAL | 5 refills | Status: DC
Start: 2021-12-13 — End: 2021-12-19
  Filled 2021-12-13: qty 30, 30d supply, fill #0

## 2021-12-14 ENCOUNTER — Other Ambulatory Visit (HOSPITAL_BASED_OUTPATIENT_CLINIC_OR_DEPARTMENT_OTHER): Payer: Self-pay

## 2021-12-14 ENCOUNTER — Other Ambulatory Visit (HOSPITAL_COMMUNITY)
Admission: RE | Admit: 2021-12-14 | Discharge: 2021-12-14 | Disposition: A | Discharge status: Home / Self Care | Payer: Commercial Managed Care - PPO | Source: Ambulatory Visit | Attending: Obstetrics & Gynecology | Admitting: Obstetrics & Gynecology

## 2021-12-14 ENCOUNTER — Ambulatory Visit (INDEPENDENT_AMBULATORY_CARE_PROVIDER_SITE_OTHER): Payer: Commercial Managed Care - PPO | Admitting: Obstetrics & Gynecology

## 2021-12-14 ENCOUNTER — Encounter: Payer: Self-pay | Admitting: Obstetrics & Gynecology

## 2021-12-14 VITALS — BP 131/60 | HR 65 | Ht 62.0 in | Wt 205.0 lb

## 2021-12-14 DIAGNOSIS — Z01419 Encounter for gynecological examination (general) (routine) without abnormal findings: Secondary | ICD-10-CM | POA: Diagnosis not present

## 2021-12-14 DIAGNOSIS — Z1231 Encounter for screening mammogram for malignant neoplasm of breast: Secondary | ICD-10-CM

## 2021-12-14 DIAGNOSIS — B3731 Acute candidiasis of vulva and vagina: Secondary | ICD-10-CM

## 2021-12-14 MED ORDER — TERCONAZOLE 0.4 % VA CREA
1.0000 | TOPICAL_CREAM | Freq: Every day | VAGINAL | 0 refills | Status: DC
  Filled 2021-12-14: qty 45, 7d supply, fill #0

## 2021-12-14 NOTE — Progress Notes (Signed)
Subjective:     Grace Gregory is a 61 y.o. female here for a routine exam.  Current complaints: Pt is s/p atbx and steroids for bronchitis. Her sx have resolved however she has vulvar vaginal itchign not completely resolved with Diflucan prescribed by her primary care provider.       Gynecologic History No LMP recorded (lmp unknown). Patient has had a hysterectomy. Contraception: post menopausal status Last Pap: n/a.  Last mammogram: 02/14/2021. Results were: normal  Obstetric History OB History  Gravida Para Term Preterm AB Living  1       1    SAB IAB Ectopic Multiple Live Births  1            # Outcome Date GA Lbr Len/2nd Weight Sex Delivery Anes PTL Lv  1 SAB 2001             The following portions of the patient's history were reviewed and updated as appropriate: allergies, current medications, past family history, past medical history, past social history, past surgical history, and problem list.  Review of Systems Pertinent items are noted in HPI.    Objective:  BP 131/60   Pulse 65   Ht '5\' 2"'$  (1.575 m)   Wt 205 lb (93 kg)   LMP  (LMP Unknown)   BMI 37.49 kg/m   General Appearance:    Alert, cooperative, no distress, appears stated age  Head:    Normocephalic, without obvious abnormality, atraumatic  Eyes:    conjunctiva/corneas clear, EOM's intact, both eyes  Ears:    Normal external ear canals, both ears  Nose:   Nares normal, septum midline, mucosa normal, no drainage    or sinus tenderness  Throat:   Lips, mucosa, and tongue normal; teeth and gums normal  Neck:   Supple, symmetrical, trachea midline, no adenopathy;    thyroid:  no enlargement/tenderness/nodules  Back:     Symmetric, no curvature, ROM normal, no CVA tenderness  Lungs:     respirations unlabored  Chest Wall:    No tenderness or deformity   Heart:    Regular rate and rhythm  Breast Exam:    No tenderness, masses, or nipple abnormality  Abdomen:     Soft, non-tender, bowel sounds active  all four quadrants,    no masses, no organomegaly  Genitalia:    Normal female without lesion, discharge or tenderness. Some white small curd discharge in the vagina. Small amount.    There is erythema around the clitoris and upper fourchette.    Extremities:   Extremities normal, atraumatic, no cyanosis or edema  Pulses:   2+ and symmetric all extremities  Skin:   Skin color, texture, turgor normal, no rashes or lesions     Assessment:    Healthy female exam.    Plan:  [name] was seen today for gynecologic exam.  Diagnoses and all orders for this visit:  Well female exam with routine gynecological exam  Vulvovaginitis due to yeast -     Cervicovaginal ancillary only( [place]) -     terconazole (TERAZOL 7) 0.4 % vaginal cream; Place 1 applicator vaginally at bedtime. Use for seven days  Breast cancer screening by mammogram -     MM 3D SCREEN BREAST BILATERAL; Future   F/u in 1 year   Jadden Yim L. Harraway-Smith, M.D., [name]

## 2021-12-15 LAB — CERVICOVAGINAL ANCILLARY ONLY
Candida Glabrata: NEGATIVE
Candida Vaginitis: POSITIVE — AB
Comment: NEGATIVE
Comment: NEGATIVE

## 2021-12-19 ENCOUNTER — Encounter: Payer: Self-pay | Admitting: Family Medicine

## 2021-12-19 ENCOUNTER — Ambulatory Visit (INDEPENDENT_AMBULATORY_CARE_PROVIDER_SITE_OTHER): Payer: Commercial Managed Care - PPO | Admitting: Family Medicine

## 2021-12-19 VITALS — BP 136/84 | HR 67 | Temp 97.2°F | Ht 62.0 in | Wt 207.4 lb

## 2021-12-19 DIAGNOSIS — E1165 Type 2 diabetes mellitus with hyperglycemia: Secondary | ICD-10-CM | POA: Diagnosis not present

## 2021-12-19 DIAGNOSIS — I10 Essential (primary) hypertension: Secondary | ICD-10-CM | POA: Diagnosis not present

## 2021-12-19 DIAGNOSIS — E7800 Pure hypercholesterolemia, unspecified: Secondary | ICD-10-CM

## 2021-12-19 NOTE — Progress Notes (Signed)
Established Patient Office Visit  Subjective   Patient ID: Grace Gregory, female    DOB: 07/16/1960  Age: 61 y.o. MRN: [id number]  Chief Complaint  Patient presents with   Follow-up    3 mo f/u. No questions / concerns    HPI follow-up obesity, diabetes and elevated cholesterol.  Blood pressure is controlled well with HCTZ and losartan.  Lipids controlled with atorvastatin.  Continues with Trulicity 7.09 q. weekly and metformin 500 daily.  Is tolerating dulaglutide well at this point.  Not had any episodes of nausea with it.  She is seeing medical weight loss management as well as dietitian in the distant past when she lived in [place].    Review of Systems  Constitutional:  Negative for chills, diaphoresis, malaise/fatigue and weight loss.  HENT: Negative.    Eyes: Negative.  Negative for blurred vision and double vision.  Respiratory: Negative.    Cardiovascular:  Negative for chest pain.  Gastrointestinal:  Negative for abdominal pain.  Genitourinary: Negative.   Musculoskeletal:  Negative for falls and myalgias.  Neurological:  Negative for speech change, loss of consciousness and weakness.  Psychiatric/Behavioral: Negative.        Objective:     BP 136/84 (BP Location: Right Arm, Patient Position: Sitting, Cuff Size: Large)   Pulse 67   Temp (!) 97.2 F (36.2 C) (Temporal)   Ht '5\' 2"'$  (1.575 m)   Wt 207 lb 6.4 oz (94.1 kg)   LMP  (LMP Unknown)   SpO2 98%   BMI 37.93 kg/m  Wt Readings from Last 3 Encounters:  12/19/21 207 lb 6.4 oz (94.1 kg)  12/14/21 205 lb (93 kg)  11/24/21 203 lb 3.2 oz (92.2 kg)      Physical Exam Constitutional:      General: She is not in acute distress.    Appearance: Normal appearance. She is obese. She is not ill-appearing, toxic-appearing or diaphoretic.  HENT:     Head: Normocephalic and atraumatic.     Right Ear: External ear normal.     Left Ear: External ear normal.     Mouth/Throat:     Mouth: Mucous membranes are  moist.     Pharynx: Oropharynx is clear. No oropharyngeal exudate or posterior oropharyngeal erythema.  Eyes:     General: No scleral icterus.       Right eye: No discharge.        Left eye: No discharge.     Extraocular Movements: Extraocular movements intact.     Conjunctiva/sclera: Conjunctivae normal.     Pupils: Pupils are equal, round, and reactive to light.  Cardiovascular:     Rate and Rhythm: Normal rate and regular rhythm.  Pulmonary:     Effort: Pulmonary effort is normal. No respiratory distress.     Breath sounds: Normal breath sounds.  Abdominal:     Tenderness: There is no abdominal tenderness. There is no guarding.  Musculoskeletal:     Cervical back: No rigidity or tenderness.  Skin:    General: Skin is warm and dry.  Neurological:     Mental Status: She is alert and oriented to person, place, and time.  Psychiatric:        Mood and Affect: Mood normal.        Behavior: Behavior normal.      No results found for any visits on 12/19/21.    The 10-year ASCVD risk score (Arnett DK, et al., 2019) is: 15.8%    Assessment &  Plan:   Problem List Items Addressed This Visit       Cardiovascular and Mediastinum   Essential hypertension     Endocrine   Type 2 diabetes mellitus with hyperglycemia, without long-term current use of insulin (HCC) - Primary     Other   Elevated LDL cholesterol level   Morbidly obese ([place])   Relevant Orders   Amb ref to Medical Nutrition Therapy-MNT    Return in about 6 months (around 06/21/2022).  Continue current medications.  We discussed increasing dulaglutide to the higher dose when she is finished with her 0.75 doses.  Referral to medical nutrition.  Libby Maw, MD

## 2021-12-20 ENCOUNTER — Encounter: Payer: Self-pay | Admitting: Obstetrics & Gynecology

## 2021-12-20 ENCOUNTER — Other Ambulatory Visit: Payer: Self-pay | Admitting: Obstetrics & Gynecology

## 2021-12-20 ENCOUNTER — Other Ambulatory Visit (HOSPITAL_BASED_OUTPATIENT_CLINIC_OR_DEPARTMENT_OTHER): Payer: Self-pay

## 2021-12-20 DIAGNOSIS — B3731 Acute candidiasis of vulva and vagina: Secondary | ICD-10-CM

## 2021-12-20 MED ORDER — FLUCONAZOLE 150 MG PO TABS
150.0000 mg | ORAL_TABLET | ORAL | 3 refills | Status: DC
  Filled 2021-12-20: qty 3, 9d supply, fill #0

## 2022-01-25 ENCOUNTER — Other Ambulatory Visit (HOSPITAL_BASED_OUTPATIENT_CLINIC_OR_DEPARTMENT_OTHER): Payer: Self-pay

## 2022-01-25 ENCOUNTER — Other Ambulatory Visit: Payer: Self-pay | Admitting: Family Medicine

## 2022-01-25 DIAGNOSIS — E1165 Type 2 diabetes mellitus with hyperglycemia: Secondary | ICD-10-CM

## 2022-01-26 ENCOUNTER — Encounter: Payer: Self-pay | Admitting: Family Medicine

## 2022-01-26 ENCOUNTER — Other Ambulatory Visit (HOSPITAL_BASED_OUTPATIENT_CLINIC_OR_DEPARTMENT_OTHER): Payer: Self-pay

## 2022-01-26 ENCOUNTER — Ambulatory Visit (INDEPENDENT_AMBULATORY_CARE_PROVIDER_SITE_OTHER): Payer: Commercial Managed Care - PPO | Admitting: Family Medicine

## 2022-01-26 ENCOUNTER — Other Ambulatory Visit: Payer: Self-pay | Admitting: Family Medicine

## 2022-01-26 VITALS — BP 138/80 | HR 66 | Temp 97.0°F | Ht 62.0 in | Wt 211.6 lb

## 2022-01-26 DIAGNOSIS — G44209 Tension-type headache, unspecified, not intractable: Secondary | ICD-10-CM | POA: Diagnosis not present

## 2022-01-26 DIAGNOSIS — Z23 Encounter for immunization: Secondary | ICD-10-CM

## 2022-01-26 DIAGNOSIS — E1165 Type 2 diabetes mellitus with hyperglycemia: Secondary | ICD-10-CM

## 2022-01-26 MED ORDER — KETOROLAC TROMETHAMINE 60 MG/2ML IM SOLN
60.0000 mg | Freq: Once | INTRAMUSCULAR | Status: AC
  Administered 2022-01-26: 60 mg via INTRAMUSCULAR

## 2022-01-26 MED ORDER — PREDNISONE 20 MG PO TABS
20.0000 mg | ORAL_TABLET | Freq: Two times a day (BID) | ORAL | 0 refills | Status: AC
  Filled 2022-01-26: qty 14, 7d supply, fill #0

## 2022-01-26 MED ORDER — TRULICITY 0.75 MG/0.5ML ~~LOC~~ SOAJ
0.7500 mg | SUBCUTANEOUS | 0 refills | Status: DC
  Filled 2022-01-26: qty 6, 84d supply, fill #0

## 2022-01-26 NOTE — Progress Notes (Signed)
Established Patient Office Visit  Subjective   Patient ID: Grace Gregory, female    DOB: 20-Jul-1960  Age: 61 y.o. MRN: [id number]  Chief Complaint  Patient presents with   Headache    Headaches x 3 weeks OTC meds not helping patient waking up and going to bed with headaches.Little nasal sinus pressure.     Headache  Pertinent negatives include no abdominal pain, blurred vision, eye redness, tingling or weakness.   for evaluation of a 3-week history of a chronic headache that has been in the back of her head and in her face.  She has noticed some light and noise sensitivity.  There is been mild nausea.  Headache has not been progressive.  Her mother-in-law recently passed from Alzheimer's.  She teared up when discussing her death.  But she said the headache Preceded her death.  Continues with allergy desensitization.  She is compliant with all of her listed medications.  Sinuses have not been bothering her in particular.    Review of Systems  Constitutional: Negative.   HENT: Negative.    Eyes:  Negative for blurred vision, discharge and redness.  Respiratory: Negative.    Cardiovascular: Negative.   Gastrointestinal:  Negative for abdominal pain.  Genitourinary: Negative.   Musculoskeletal: Negative.  Negative for myalgias.  Skin:  Negative for rash.  Neurological:  Positive for headaches. Negative for tingling, loss of consciousness and weakness.  Endo/Heme/Allergies:  Negative for polydipsia.  Psychiatric/Behavioral:  Negative for depression. The patient is not nervous/anxious.       01/26/2022    9:39 AM 12/19/2021    2:04 PM 08/29/2021    2:04 PM  Depression screen PHQ 2/9  Decreased Interest 0 0 0  Down, Depressed, Hopeless 0 0 0  PHQ - 2 Score 0 0 0        Objective:     BP 138/80 (BP Location: Left Arm, Patient Position: Sitting, Cuff Size: Large)   Pulse 66   Temp (!) 97 F (36.1 C) (Temporal)   Ht '5\' 2"'$  (1.575 m)   Wt 211 lb 9.6 oz (96 kg)   LMP   (LMP Unknown)   SpO2 97%   BMI 38.70 kg/m  Wt Readings from Last 3 Encounters:  01/26/22 211 lb 9.6 oz (96 kg)  12/19/21 207 lb 6.4 oz (94.1 kg)  12/14/21 205 lb (93 kg)      Physical Exam Constitutional:      General: She is not in acute distress.    Appearance: Normal appearance. She is not ill-appearing, toxic-appearing or diaphoretic.  HENT:     Head: Normocephalic and atraumatic.     Right Ear: External ear normal.     Left Ear: External ear normal.     Mouth/Throat:     Mouth: Mucous membranes are moist.     Pharynx: Oropharynx is clear. No oropharyngeal exudate or posterior oropharyngeal erythema.  Eyes:     General: No visual field deficit or scleral icterus.       Right eye: No discharge.        Left eye: No discharge.     Extraocular Movements: Extraocular movements intact.     Conjunctiva/sclera: Conjunctivae normal.     Pupils: Pupils are equal, round, and reactive to light. Pupils are equal.  Pulmonary:     Effort: No respiratory distress.     Breath sounds: Normal breath sounds.  Abdominal:     General: Bowel sounds are normal.  Tenderness: There is no abdominal tenderness. There is no guarding.  Musculoskeletal:     Cervical back: No rigidity or tenderness.  Skin:    General: Skin is warm and dry.  Neurological:     Mental Status: She is alert and oriented to person, place, and time.     Cranial Nerves: No cranial nerve deficit, dysarthria or facial asymmetry.  Psychiatric:        Mood and Affect: Mood normal.        Behavior: Behavior normal.      No results found for any visits on 01/26/22.    The 10-year ASCVD risk score (Arnett DK, et al., 2019) is: 16.4%    Assessment & Plan:   Problem List Items Addressed This Visit       Other   Need for influenza vaccination   Relevant Orders   Flu Vaccine QUAD 6+ mos PF IM (Fluarix Quad PF)   Acute non intractable tension-type headache - Primary   Relevant Medications   ketorolac (TORADOL)  injection 60 mg (Start on 01/26/2022 10:30 AM)   predniSONE (DELTASONE) 20 MG tablet   Other Relevant Orders   MR Brain W Wo Contrast    Return Advised patient to go to emergency room if headache not relieved with above.Libby Maw, MD

## 2022-01-31 ENCOUNTER — Telehealth (HOSPITAL_BASED_OUTPATIENT_CLINIC_OR_DEPARTMENT_OTHER): Payer: Self-pay

## 2022-02-04 ENCOUNTER — Encounter: Payer: Self-pay | Admitting: Family Medicine

## 2022-02-04 DIAGNOSIS — G44209 Tension-type headache, unspecified, not intractable: Secondary | ICD-10-CM

## 2022-02-06 ENCOUNTER — Encounter: Payer: Self-pay | Admitting: Family Medicine

## 2022-02-06 ENCOUNTER — Other Ambulatory Visit (HOSPITAL_BASED_OUTPATIENT_CLINIC_OR_DEPARTMENT_OTHER): Payer: Self-pay

## 2022-02-06 MED ORDER — RIZATRIPTAN BENZOATE 5 MG PO TABS
5.0000 mg | ORAL_TABLET | ORAL | 0 refills | Status: DC | PRN
  Filled 2022-02-06: qty 9, 15d supply, fill #0

## 2022-02-16 ENCOUNTER — Telehealth (HOSPITAL_BASED_OUTPATIENT_CLINIC_OR_DEPARTMENT_OTHER): Payer: Self-pay

## 2022-02-21 ENCOUNTER — Ambulatory Visit (HOSPITAL_BASED_OUTPATIENT_CLINIC_OR_DEPARTMENT_OTHER): Payer: Commercial Managed Care - PPO

## 2022-02-27 DIAGNOSIS — J3081 Allergic rhinitis due to animal (cat) (dog) hair and dander: Secondary | ICD-10-CM | POA: Diagnosis not present

## 2022-02-27 DIAGNOSIS — J3089 Other allergic rhinitis: Secondary | ICD-10-CM | POA: Diagnosis not present

## 2022-02-27 DIAGNOSIS — J301 Allergic rhinitis due to pollen: Secondary | ICD-10-CM | POA: Diagnosis not present

## 2022-03-06 ENCOUNTER — Other Ambulatory Visit (HOSPITAL_BASED_OUTPATIENT_CLINIC_OR_DEPARTMENT_OTHER): Payer: Self-pay

## 2022-03-09 DIAGNOSIS — J3089 Other allergic rhinitis: Secondary | ICD-10-CM | POA: Diagnosis not present

## 2022-03-09 DIAGNOSIS — J3081 Allergic rhinitis due to animal (cat) (dog) hair and dander: Secondary | ICD-10-CM | POA: Diagnosis not present

## 2022-03-09 DIAGNOSIS — J301 Allergic rhinitis due to pollen: Secondary | ICD-10-CM | POA: Diagnosis not present

## 2022-03-13 ENCOUNTER — Other Ambulatory Visit (HOSPITAL_BASED_OUTPATIENT_CLINIC_OR_DEPARTMENT_OTHER): Payer: Self-pay

## 2022-03-14 ENCOUNTER — Other Ambulatory Visit (HOSPITAL_BASED_OUTPATIENT_CLINIC_OR_DEPARTMENT_OTHER): Payer: Self-pay

## 2022-03-14 ENCOUNTER — Telehealth: Payer: Self-pay | Admitting: Family Medicine

## 2022-03-14 DIAGNOSIS — R079 Chest pain, unspecified: Secondary | ICD-10-CM

## 2022-03-14 DIAGNOSIS — K219 Gastro-esophageal reflux disease without esophagitis: Secondary | ICD-10-CM

## 2022-03-14 MED ORDER — PANTOPRAZOLE SODIUM 40 MG PO TBEC: 80.0000 mg | DELAYED_RELEASE_TABLET | Freq: Every day | ORAL | 1 refills | Status: DC

## 2022-03-14 NOTE — Telephone Encounter (Signed)
Caller Name: [facility]  Call back phone #: [phone]   MEDICATION(S):  pantoprazole (PROTONIX) 40 MG tablet [[id number]]     Has the patient contacted their pharmacy (YES/NO)? Yes this is her new pharmacy   Preferred Pharmacy:  [facility]  [phone]

## 2022-03-15 DIAGNOSIS — J3089 Other allergic rhinitis: Secondary | ICD-10-CM | POA: Diagnosis not present

## 2022-03-15 DIAGNOSIS — J3081 Allergic rhinitis due to animal (cat) (dog) hair and dander: Secondary | ICD-10-CM | POA: Diagnosis not present

## 2022-03-15 DIAGNOSIS — J301 Allergic rhinitis due to pollen: Secondary | ICD-10-CM | POA: Diagnosis not present

## 2022-03-20 ENCOUNTER — Other Ambulatory Visit (HOSPITAL_BASED_OUTPATIENT_CLINIC_OR_DEPARTMENT_OTHER): Payer: Self-pay

## 2022-03-21 ENCOUNTER — Telehealth: Payer: Self-pay | Admitting: Family Medicine

## 2022-03-21 DIAGNOSIS — J3081 Allergic rhinitis due to animal (cat) (dog) hair and dander: Secondary | ICD-10-CM | POA: Diagnosis not present

## 2022-03-21 DIAGNOSIS — J301 Allergic rhinitis due to pollen: Secondary | ICD-10-CM | POA: Diagnosis not present

## 2022-03-21 DIAGNOSIS — E7800 Pure hypercholesterolemia, unspecified: Secondary | ICD-10-CM

## 2022-03-21 DIAGNOSIS — J9801 Acute bronchospasm: Secondary | ICD-10-CM

## 2022-03-21 DIAGNOSIS — J3089 Other allergic rhinitis: Secondary | ICD-10-CM | POA: Diagnosis not present

## 2022-03-21 DIAGNOSIS — E1165 Type 2 diabetes mellitus with hyperglycemia: Secondary | ICD-10-CM

## 2022-03-21 DIAGNOSIS — J4541 Moderate persistent asthma with (acute) exacerbation: Secondary | ICD-10-CM

## 2022-03-21 MED ORDER — TRULICITY 0.75 MG/0.5ML ~~LOC~~ SOAJ: 0.7500 mg | SUBCUTANEOUS | 0 refills | Status: DC

## 2022-03-21 MED ORDER — ALBUTEROL SULFATE HFA 108 (90 BASE) MCG/ACT IN AERS: INHALATION_SPRAY | RESPIRATORY_TRACT | 0 refills | Status: DC

## 2022-03-21 MED ORDER — ATORVASTATIN CALCIUM 20 MG PO TABS: 20.0000 mg | ORAL_TABLET | Freq: Every day | ORAL | 3 refills | Status: DC

## 2022-03-21 NOTE — Telephone Encounter (Signed)
Requested refills sent in

## 2022-03-21 NOTE — Telephone Encounter (Signed)
Caller Name: pharmacy Call back phone #: [phone]   MEDICATION(S):  Albuterol, atorvastatin, and Trulicity  Days of Med Remaining:   Has the patient contacted their pharmacy (YES/NO)? yes What did pharmacy advise? Pt had no order in for them  Preferred Pharmacy:  [facility] Phone:  [phone]      ~~~Please advise patient/caregiver to allow 2-3 business days to process RX refills.

## 2022-03-28 DIAGNOSIS — J3081 Allergic rhinitis due to animal (cat) (dog) hair and dander: Secondary | ICD-10-CM | POA: Diagnosis not present

## 2022-03-28 DIAGNOSIS — J301 Allergic rhinitis due to pollen: Secondary | ICD-10-CM | POA: Diagnosis not present

## 2022-03-28 DIAGNOSIS — J3089 Other allergic rhinitis: Secondary | ICD-10-CM | POA: Diagnosis not present

## 2022-04-04 DIAGNOSIS — J3089 Other allergic rhinitis: Secondary | ICD-10-CM | POA: Diagnosis not present

## 2022-04-04 DIAGNOSIS — J301 Allergic rhinitis due to pollen: Secondary | ICD-10-CM | POA: Diagnosis not present

## 2022-04-11 ENCOUNTER — Other Ambulatory Visit (HOSPITAL_COMMUNITY): Payer: Self-pay

## 2022-04-11 ENCOUNTER — Telehealth: Payer: Self-pay

## 2022-04-11 NOTE — Telephone Encounter (Signed)
Pharmacy Patient Advocate Encounter   Received notification from [employer] that prior authorization for Trulicity 5.95 ZX/6.7 ML Pen is required/requested.   PA submitted on 04/11/2022 to [facility] via fax  Status is pending

## 2022-04-12 DIAGNOSIS — J3089 Other allergic rhinitis: Secondary | ICD-10-CM | POA: Diagnosis not present

## 2022-04-12 DIAGNOSIS — J3081 Allergic rhinitis due to animal (cat) (dog) hair and dander: Secondary | ICD-10-CM | POA: Diagnosis not present

## 2022-04-12 DIAGNOSIS — J301 Allergic rhinitis due to pollen: Secondary | ICD-10-CM | POA: Diagnosis not present

## 2022-04-15 ENCOUNTER — Other Ambulatory Visit: Payer: Self-pay | Admitting: Family Medicine

## 2022-04-15 DIAGNOSIS — E7800 Pure hypercholesterolemia, unspecified: Secondary | ICD-10-CM

## 2022-04-17 NOTE — Telephone Encounter (Signed)
Pharmacy Patient Advocate Encounter  Received notification from [employer] that the request for prior authorization for Trulicity 8.37 GB/0.2 ML Pen  has been denied due to member does not meet the established medication-specific criteria or guidelines.      This determination is currently being reviewed for appeal.    Specialty Pharmacy Patient Advocate Fax: [phone]

## 2022-04-19 ENCOUNTER — Encounter: Payer: Self-pay | Admitting: Family Medicine

## 2022-04-19 DIAGNOSIS — J3089 Other allergic rhinitis: Secondary | ICD-10-CM | POA: Diagnosis not present

## 2022-04-19 DIAGNOSIS — J301 Allergic rhinitis due to pollen: Secondary | ICD-10-CM | POA: Diagnosis not present

## 2022-04-19 DIAGNOSIS — J3081 Allergic rhinitis due to animal (cat) (dog) hair and dander: Secondary | ICD-10-CM | POA: Diagnosis not present

## 2022-04-24 ENCOUNTER — Encounter: Payer: Self-pay | Admitting: Family Medicine

## 2022-04-24 NOTE — Telephone Encounter (Signed)
Approved patient aware

## 2022-04-25 ENCOUNTER — Other Ambulatory Visit (HOSPITAL_COMMUNITY): Payer: Self-pay

## 2022-04-26 DIAGNOSIS — J301 Allergic rhinitis due to pollen: Secondary | ICD-10-CM | POA: Diagnosis not present

## 2022-04-26 DIAGNOSIS — J3089 Other allergic rhinitis: Secondary | ICD-10-CM | POA: Diagnosis not present

## 2022-04-26 DIAGNOSIS — J3081 Allergic rhinitis due to animal (cat) (dog) hair and dander: Secondary | ICD-10-CM | POA: Diagnosis not present

## 2022-05-02 ENCOUNTER — Telehealth: Payer: Self-pay | Admitting: Family Medicine

## 2022-05-02 DIAGNOSIS — J301 Allergic rhinitis due to pollen: Secondary | ICD-10-CM | POA: Diagnosis not present

## 2022-05-02 DIAGNOSIS — J9801 Acute bronchospasm: Secondary | ICD-10-CM

## 2022-05-02 DIAGNOSIS — J3081 Allergic rhinitis due to animal (cat) (dog) hair and dander: Secondary | ICD-10-CM | POA: Diagnosis not present

## 2022-05-02 DIAGNOSIS — J3089 Other allergic rhinitis: Secondary | ICD-10-CM | POA: Diagnosis not present

## 2022-05-02 MED ORDER — FLUTICASONE PROPIONATE HFA 110 MCG/ACT IN AERO: INHALATION_SPRAY | RESPIRATORY_TRACT | 3 refills | Status: DC

## 2022-05-02 NOTE — Telephone Encounter (Signed)
Caller Name: [facility] Call back phone #: [phone]    MEDICATION(S):  Rx #: [id number]  fluticasone (FLOVENT HFA) 110 MCG/ACT inhaler [[id number]]   Days of Med Remaining: she did not know   Has the patient contacted their pharmacy (YES/NO)? Yes call your pcp   Preferred Pharmacy:  [facility] fax@ [phone]  ~~~Please advise patient/caregiver to allow 2-3 business days to process RX refills.

## 2022-05-04 ENCOUNTER — Encounter: Payer: Self-pay | Admitting: Family Medicine

## 2022-05-08 ENCOUNTER — Other Ambulatory Visit (HOSPITAL_BASED_OUTPATIENT_CLINIC_OR_DEPARTMENT_OTHER): Payer: Self-pay

## 2022-05-08 DIAGNOSIS — J301 Allergic rhinitis due to pollen: Secondary | ICD-10-CM | POA: Diagnosis not present

## 2022-05-08 DIAGNOSIS — J3081 Allergic rhinitis due to animal (cat) (dog) hair and dander: Secondary | ICD-10-CM | POA: Diagnosis not present

## 2022-05-08 DIAGNOSIS — J3089 Other allergic rhinitis: Secondary | ICD-10-CM | POA: Diagnosis not present

## 2022-05-08 MED ORDER — BECLOMETHASONE DIPROP HFA 80 MCG/ACT IN AERB
2.0000 | INHALATION_SPRAY | Freq: Two times a day (BID) | RESPIRATORY_TRACT | 3 refills | Status: DC
  Filled 2022-05-08: qty 31.8, 90d supply, fill #0
  Filled 2022-05-10: qty 31.8, 30d supply, fill #0

## 2022-05-09 ENCOUNTER — Encounter: Payer: Self-pay | Admitting: Family Medicine

## 2022-05-09 DIAGNOSIS — H16143 Punctate keratitis, bilateral: Secondary | ICD-10-CM | POA: Diagnosis not present

## 2022-05-09 DIAGNOSIS — H0102B Squamous blepharitis left eye, upper and lower eyelids: Secondary | ICD-10-CM | POA: Diagnosis not present

## 2022-05-09 DIAGNOSIS — E119 Type 2 diabetes mellitus without complications: Secondary | ICD-10-CM | POA: Diagnosis not present

## 2022-05-09 DIAGNOSIS — H0102A Squamous blepharitis right eye, upper and lower eyelids: Secondary | ICD-10-CM | POA: Diagnosis not present

## 2022-05-09 LAB — HM DIABETES EYE EXAM

## 2022-05-10 ENCOUNTER — Encounter: Payer: Self-pay | Admitting: Family Medicine

## 2022-05-10 ENCOUNTER — Other Ambulatory Visit (HOSPITAL_BASED_OUTPATIENT_CLINIC_OR_DEPARTMENT_OTHER): Payer: Self-pay

## 2022-05-18 DIAGNOSIS — J301 Allergic rhinitis due to pollen: Secondary | ICD-10-CM | POA: Diagnosis not present

## 2022-05-18 DIAGNOSIS — J3089 Other allergic rhinitis: Secondary | ICD-10-CM | POA: Diagnosis not present

## 2022-05-18 DIAGNOSIS — J3081 Allergic rhinitis due to animal (cat) (dog) hair and dander: Secondary | ICD-10-CM | POA: Diagnosis not present

## 2022-05-25 DIAGNOSIS — J301 Allergic rhinitis due to pollen: Secondary | ICD-10-CM | POA: Diagnosis not present

## 2022-05-25 DIAGNOSIS — J3089 Other allergic rhinitis: Secondary | ICD-10-CM | POA: Diagnosis not present

## 2022-06-01 DIAGNOSIS — J301 Allergic rhinitis due to pollen: Secondary | ICD-10-CM | POA: Diagnosis not present

## 2022-06-01 DIAGNOSIS — J3089 Other allergic rhinitis: Secondary | ICD-10-CM | POA: Diagnosis not present

## 2022-06-01 DIAGNOSIS — J3081 Allergic rhinitis due to animal (cat) (dog) hair and dander: Secondary | ICD-10-CM | POA: Diagnosis not present

## 2022-06-05 DIAGNOSIS — J301 Allergic rhinitis due to pollen: Secondary | ICD-10-CM | POA: Diagnosis not present

## 2022-06-05 DIAGNOSIS — J3081 Allergic rhinitis due to animal (cat) (dog) hair and dander: Secondary | ICD-10-CM | POA: Diagnosis not present

## 2022-06-05 DIAGNOSIS — J3089 Other allergic rhinitis: Secondary | ICD-10-CM | POA: Diagnosis not present

## 2022-06-08 DIAGNOSIS — Z1231 Encounter for screening mammogram for malignant neoplasm of breast: Secondary | ICD-10-CM | POA: Diagnosis not present

## 2022-06-14 ENCOUNTER — Other Ambulatory Visit: Payer: Self-pay

## 2022-06-14 DIAGNOSIS — E2839 Other primary ovarian failure: Secondary | ICD-10-CM

## 2022-06-14 MED ORDER — ESTRADIOL 0.05 MG/24HR TD PTTW: 1.0000 | MEDICATED_PATCH | TRANSDERMAL | 3 refills | Status: DC

## 2022-06-15 DIAGNOSIS — J301 Allergic rhinitis due to pollen: Secondary | ICD-10-CM | POA: Diagnosis not present

## 2022-06-15 DIAGNOSIS — J3089 Other allergic rhinitis: Secondary | ICD-10-CM | POA: Diagnosis not present

## 2022-06-15 DIAGNOSIS — J3081 Allergic rhinitis due to animal (cat) (dog) hair and dander: Secondary | ICD-10-CM | POA: Diagnosis not present

## 2022-06-19 ENCOUNTER — Other Ambulatory Visit: Payer: Self-pay | Admitting: Family Medicine

## 2022-06-19 DIAGNOSIS — J301 Allergic rhinitis due to pollen: Secondary | ICD-10-CM | POA: Diagnosis not present

## 2022-06-19 DIAGNOSIS — J3081 Allergic rhinitis due to animal (cat) (dog) hair and dander: Secondary | ICD-10-CM | POA: Diagnosis not present

## 2022-06-19 DIAGNOSIS — J3089 Other allergic rhinitis: Secondary | ICD-10-CM | POA: Diagnosis not present

## 2022-06-21 ENCOUNTER — Ambulatory Visit: Payer: Commercial Managed Care - PPO | Admitting: Family Medicine

## 2022-06-26 DIAGNOSIS — J301 Allergic rhinitis due to pollen: Secondary | ICD-10-CM | POA: Diagnosis not present

## 2022-06-26 DIAGNOSIS — J3089 Other allergic rhinitis: Secondary | ICD-10-CM | POA: Diagnosis not present

## 2022-06-26 DIAGNOSIS — J3081 Allergic rhinitis due to animal (cat) (dog) hair and dander: Secondary | ICD-10-CM | POA: Diagnosis not present

## 2022-06-30 ENCOUNTER — Encounter: Payer: Self-pay | Admitting: Family Medicine

## 2022-06-30 ENCOUNTER — Ambulatory Visit: Payer: BC Managed Care – PPO | Admitting: Family Medicine

## 2022-06-30 VITALS — BP 128/74 | HR 70 | Temp 97.7°F | Ht 62.0 in | Wt 211.0 lb

## 2022-06-30 DIAGNOSIS — J4540 Moderate persistent asthma, uncomplicated: Secondary | ICD-10-CM | POA: Diagnosis not present

## 2022-06-30 DIAGNOSIS — E1165 Type 2 diabetes mellitus with hyperglycemia: Secondary | ICD-10-CM

## 2022-06-30 DIAGNOSIS — E7800 Pure hypercholesterolemia, unspecified: Secondary | ICD-10-CM | POA: Diagnosis not present

## 2022-06-30 DIAGNOSIS — H8309 Labyrinthitis, unspecified ear: Secondary | ICD-10-CM

## 2022-06-30 DIAGNOSIS — K219 Gastro-esophageal reflux disease without esophagitis: Secondary | ICD-10-CM

## 2022-06-30 DIAGNOSIS — J4541 Moderate persistent asthma with (acute) exacerbation: Secondary | ICD-10-CM

## 2022-06-30 MED ORDER — PANTOPRAZOLE SODIUM 40 MG PO TBEC: 80.0000 mg | DELAYED_RELEASE_TABLET | Freq: Every day | ORAL | 1 refills | Status: DC

## 2022-06-30 MED ORDER — BECLOMETHASONE DIPROP HFA 80 MCG/ACT IN AERB: 2.0000 | INHALATION_SPRAY | Freq: Two times a day (BID) | RESPIRATORY_TRACT | 3 refills | Status: AC

## 2022-06-30 MED ORDER — METFORMIN HCL ER 500 MG PO TB24: 1000.0000 mg | ORAL_TABLET | Freq: Two times a day (BID) | ORAL | 3 refills | Status: AC

## 2022-06-30 MED ORDER — ATORVASTATIN CALCIUM 20 MG PO TABS: 20.0000 mg | ORAL_TABLET | Freq: Every day | ORAL | 3 refills | Status: DC

## 2022-06-30 MED ORDER — MECLIZINE HCL 25 MG PO TABS: 25.0000 mg | ORAL_TABLET | Freq: Three times a day (TID) | ORAL | 0 refills | Status: DC | PRN

## 2022-06-30 MED ORDER — ALBUTEROL SULFATE HFA 108 (90 BASE) MCG/ACT IN AERS: INHALATION_SPRAY | RESPIRATORY_TRACT | 0 refills | Status: AC

## 2022-06-30 MED ORDER — MONTELUKAST SODIUM 10 MG PO TABS: 10.0000 mg | ORAL_TABLET | Freq: Every day | ORAL | 3 refills | Status: AC

## 2022-06-30 NOTE — Progress Notes (Signed)
Established Patient Office Visit   Subjective:  Patient ID: Grace Gregory, female    DOB: [birth year]  Age: 62 y.o. MRN: [id number]  Chief Complaint  Patient presents with   Follow-up    6 month follow up, no concerns. Patient not fasting.     HPI Encounter Diagnoses  Name Primary?   Moderate persistent asthma, uncomplicated Yes   Elevated LDL cholesterol level    Gastroesophageal reflux disease, unspecified whether esophagitis present    Type 2 diabetes mellitus with hyperglycemia, without long-term current use of insulin (HCC)    Labyrinthitis, unspecified laterality    Moderate persistent asthmatic bronchitis with acute exacerbation    Doing okay.  Mother passed this past December.  Of course she continues to grieve.  She has felt a certain unsteadiness with walking on occasion.  She has not fallen but may lean to the side.  She does have a history of vertigo in the past.  Continues follow-up with ENT for allergy rhinitis symptoms.  She has been exercising by playing pickle ball.  Continues current medications without issue.   Review of Systems  Constitutional: Negative.   HENT: Negative.    Eyes:  Negative for blurred vision, discharge and redness.  Respiratory: Negative.    Cardiovascular: Negative.   Gastrointestinal:  Negative for abdominal pain.  Genitourinary: Negative.   Musculoskeletal: Negative.  Negative for myalgias.  Skin:  Negative for rash.  Neurological:  Negative for dizziness, tingling, loss of consciousness and weakness.  Endo/Heme/Allergies:  Negative for polydipsia.      06/30/2022   11:00 AM 01/26/2022   11:05 AM 01/26/2022    9:39 AM  Depression screen PHQ 2/9  Decreased Interest 0 0 0  Down, Depressed, Hopeless 0 0 0  PHQ - 2 Score 0 0 0  Altered sleeping  0   Tired, decreased energy  0   Change in appetite  0   Feeling bad or failure about yourself   0   Trouble concentrating  0   Moving slowly or fidgety/restless  0   Suicidal thoughts   0   PHQ-9 Score  0       Current Outpatient Medications:    AMBULATORY NON FORMULARY MEDICATION, Medication Name: Allergy shot x2 a week, Disp: , Rfl:    AUVI-Q 0.3 MG/0.3ML SOAJ injection, , Disp: , Rfl:    azelastine (ASTELIN) 0.1 % nasal spray, Place 1-2 puffs in each nostril Nasally Twice a day, Disp: 30 mL, Rfl: 5   Blood Glucose Monitoring Suppl (FREESTYLE LITE) DEVI, Use to test blood sugars 1-2 times daily., Disp: 1 each, Rfl: 0   cetirizine (ZYRTEC ALLERGY) 10 MG tablet, Take 1 tablet by mouth once a day, Disp: 100 tablet, Rfl: 5   clobetasol cream (TEMOVATE) [id number] %, Apply 1 application topically 2 (two) times daily. Not for face or private areas., Disp: 60 g, Rfl: 0   estradiol (VIVELLE-DOT) 0.05 MG/24HR patch, Place 1 patch (0.05 mg total) onto the skin 2 (two) times a week., Disp: 24 patch, Rfl: 3   gabapentin (NEURONTIN) 300 MG capsule, TAKE 1 CAPSULE BY MOUTH  TWICE DAILY, Disp: 180 capsule, Rfl: 3   glucose blood (FREESTYLE LITE) test strip, Use to test blood sugars 1-2 times daily., Disp: 100 each, Rfl: 12   hydrochlorothiazide (HYDRODIURIL) 12.5 MG tablet, TAKE 1 TABLET BY MOUTH DAILY, Disp: 90 tablet, Rfl: 0   Lancets (FREESTYLE) lancets, Use to test blood sugars 1-2 times daily., Disp: 100 each, Rfl:  12   losartan (COZAAR) 50 MG tablet, TAKE ONE TABLET BY MOUTH EVERY DAY, Disp: 90 tablet, Rfl: 0   meclizine (ANTIVERT) 25 MG tablet, Take 1 tablet (25 mg total) by mouth 3 (three) times daily as needed for dizziness., Disp: 50 tablet, Rfl: 0   metoprolol succinate (TOPROL-XL) 25 MG 24 hr tablet, TAKE ONE TABLET BY MOUTH EVERY DAY, Disp: 90 tablet, Rfl: 0   albuterol (PROAIR HFA) 108 (90 Base) MCG/ACT inhaler, Inhale 2 puffs by mouth as needed every  4-6 hours as needed for cough/wheeze, Disp: 8.5 g, Rfl: 0   atorvastatin (LIPITOR) 20 MG tablet, Take 1 tablet (20 mg total) by mouth daily., Disp: 90 tablet, Rfl: 3   beclomethasone (QVAR) 80 MCG/ACT inhaler, Inhale 2 puffs into  the lungs in the morning and at bedtime., Disp: 31.8 g, Rfl: 3   metFORMIN (GLUCOPHAGE-XR) 500 MG 24 hr tablet, Take 2 tablets (1,000 mg total) by mouth 2 (two) times daily., Disp: 360 tablet, Rfl: 3   montelukast (SINGULAIR) 10 MG tablet, Take 1 tablet (10 mg total) by mouth daily., Disp: 90 tablet, Rfl: 3   pantoprazole (PROTONIX) 40 MG tablet, Take 2 tablets (80 mg total) by mouth daily., Disp: 180 tablet, Rfl: 1   Objective:     BP 128/74 (BP Location: Left Arm, Patient Position: Sitting, Cuff Size: Normal)   Pulse 70   Temp 97.7 F (36.5 C) (Temporal)   Ht 5' 2"$  (1.575 m)   Wt 211 lb (95.7 kg)   LMP  (LMP Unknown)   SpO2 97%   BMI 38.59 kg/m  BP Readings from Last 3 Encounters:  06/30/22 128/74  01/26/22 138/80  12/19/21 136/84   Wt Readings from Last 3 Encounters:  06/30/22 211 lb (95.7 kg)  01/26/22 211 lb 9.6 oz (96 kg)  12/19/21 207 lb 6.4 oz (94.1 kg)      Physical Exam Constitutional:      General: She is not in acute distress.    Appearance: Normal appearance. She is not ill-appearing, toxic-appearing or diaphoretic.  HENT:     Head: Normocephalic and atraumatic.     Right Ear: Tympanic membrane, ear canal and external ear normal.     Left Ear: Tympanic membrane, ear canal and external ear normal.     Mouth/Throat:     Mouth: Mucous membranes are moist.     Pharynx: Oropharynx is clear. No oropharyngeal exudate or posterior oropharyngeal erythema.  Eyes:     General: No scleral icterus.       Right eye: No discharge.        Left eye: No discharge.     Extraocular Movements: Extraocular movements intact.     Conjunctiva/sclera: Conjunctivae normal.     Pupils: Pupils are equal, round, and reactive to light.  Cardiovascular:     Rate and Rhythm: Normal rate and regular rhythm.  Pulmonary:     Effort: Pulmonary effort is normal. No respiratory distress.     Breath sounds: Normal breath sounds. No wheezing or rales.  Abdominal:     General: Bowel sounds  are normal.     Tenderness: There is no abdominal tenderness. There is no guarding.  Musculoskeletal:     Cervical back: No rigidity or tenderness.     Right lower leg: No edema.     Left lower leg: No edema.  Skin:    General: Skin is warm and dry.  Neurological:     Mental Status: She is alert and  oriented to person, place, and time.  Psychiatric:        Mood and Affect: Mood normal.        Behavior: Behavior normal.    Diabetic Foot Exam - Simple   No data filed     Diabetic Foot Exam - Simple   Simple Foot Form Diabetic Foot exam was performed with the following findings: Yes 06/30/2022 12:11 PM  Visual Inspection See comments: Yes Sensation Testing Intact to touch and monofilament testing bilaterally: Yes Pulse Check Posterior Tibialis and Dorsalis pulse intact bilaterally: Yes Comments Feet are pes planus bilaterally.  There are no lesions or rashes.  Capillary refill is brisk in both great toes.        The 10-year ASCVD risk score (Arnett DK, et al., 2019) is: 13.5%    Assessment & Plan:   Moderate persistent asthma, uncomplicated -     Beclomethasone Diprop HFA; Inhale 2 puffs into the lungs in the morning and at bedtime.  Dispense: 31.8 g; Refill: 3 -     Albuterol Sulfate HFA; Inhale 2 puffs by mouth as needed every  4-6 hours as needed for cough/wheeze  Dispense: 8.5 g; Refill: 0  Elevated LDL cholesterol level -     Atorvastatin Calcium; Take 1 tablet (20 mg total) by mouth daily.  Dispense: 90 tablet; Refill: 3 -     Comprehensive metabolic panel; Future -     Lipid panel; Future  Gastroesophageal reflux disease, unspecified whether esophagitis present -     Pantoprazole Sodium; Take 2 tablets (80 mg total) by mouth daily.  Dispense: 180 tablet; Refill: 1  Type 2 diabetes mellitus with hyperglycemia, without long-term current use of insulin (HCC) -     metFORMIN HCl ER; Take 2 tablets (1,000 mg total) by mouth 2 (two) times daily.  Dispense: 360 tablet;  Refill: 3 -     CBC with Differential/Platelet; Future -     Comprehensive metabolic panel; Future -     Hemoglobin A1c; Future -     Urinalysis, Routine w reflex microscopic; Future -     Microalbumin / creatinine urine ratio; Future  Labyrinthitis, unspecified laterality -     Meclizine HCl; Take 1 tablet (25 mg total) by mouth 3 (three) times daily as needed for dizziness.  Dispense: 50 tablet; Refill: 0  Moderate persistent asthmatic bronchitis with acute exacerbation  Other orders -     Montelukast Sodium; Take 1 tablet (10 mg total) by mouth daily.  Dispense: 90 tablet; Refill: 3    Return in about 1 year (around 07/01/2023), or if symptoms worsen or fail to improve.  Trial of meclizine for probable vertigo.  She will see her ENT soon and discuss it with him.  Follow-up back with me if not improving or worse.  She will return fasting for above ordered blood work.  Libby Maw, MD

## 2022-07-04 ENCOUNTER — Other Ambulatory Visit: Payer: BC Managed Care – PPO

## 2022-07-11 ENCOUNTER — Other Ambulatory Visit: Payer: Self-pay | Admitting: Family Medicine

## 2022-07-11 ENCOUNTER — Other Ambulatory Visit: Payer: BC Managed Care – PPO

## 2022-07-11 DIAGNOSIS — E1165 Type 2 diabetes mellitus with hyperglycemia: Secondary | ICD-10-CM

## 2022-07-12 ENCOUNTER — Other Ambulatory Visit: Payer: Self-pay

## 2022-07-12 ENCOUNTER — Encounter: Payer: Self-pay | Admitting: Family Medicine

## 2022-07-12 ENCOUNTER — Other Ambulatory Visit (HOSPITAL_BASED_OUTPATIENT_CLINIC_OR_DEPARTMENT_OTHER): Payer: Self-pay

## 2022-07-13 DIAGNOSIS — J3081 Allergic rhinitis due to animal (cat) (dog) hair and dander: Secondary | ICD-10-CM | POA: Diagnosis not present

## 2022-07-13 DIAGNOSIS — J3089 Other allergic rhinitis: Secondary | ICD-10-CM | POA: Diagnosis not present

## 2022-07-13 DIAGNOSIS — J301 Allergic rhinitis due to pollen: Secondary | ICD-10-CM | POA: Diagnosis not present

## 2022-07-17 DIAGNOSIS — J301 Allergic rhinitis due to pollen: Secondary | ICD-10-CM | POA: Diagnosis not present

## 2022-07-17 DIAGNOSIS — J3089 Other allergic rhinitis: Secondary | ICD-10-CM | POA: Diagnosis not present

## 2022-07-17 DIAGNOSIS — J3081 Allergic rhinitis due to animal (cat) (dog) hair and dander: Secondary | ICD-10-CM | POA: Diagnosis not present

## 2022-07-20 ENCOUNTER — Other Ambulatory Visit: Payer: Self-pay | Admitting: Family Medicine

## 2022-07-20 DIAGNOSIS — H8309 Labyrinthitis, unspecified ear: Secondary | ICD-10-CM

## 2022-07-24 DIAGNOSIS — J3081 Allergic rhinitis due to animal (cat) (dog) hair and dander: Secondary | ICD-10-CM | POA: Diagnosis not present

## 2022-07-24 DIAGNOSIS — J3089 Other allergic rhinitis: Secondary | ICD-10-CM | POA: Diagnosis not present

## 2022-07-24 DIAGNOSIS — J301 Allergic rhinitis due to pollen: Secondary | ICD-10-CM | POA: Diagnosis not present

## 2022-07-31 DIAGNOSIS — J3081 Allergic rhinitis due to animal (cat) (dog) hair and dander: Secondary | ICD-10-CM | POA: Diagnosis not present

## 2022-07-31 DIAGNOSIS — J3089 Other allergic rhinitis: Secondary | ICD-10-CM | POA: Diagnosis not present

## 2022-07-31 DIAGNOSIS — J301 Allergic rhinitis due to pollen: Secondary | ICD-10-CM | POA: Diagnosis not present

## 2022-08-01 ENCOUNTER — Other Ambulatory Visit (INDEPENDENT_AMBULATORY_CARE_PROVIDER_SITE_OTHER): Payer: BC Managed Care – PPO

## 2022-08-01 ENCOUNTER — Ambulatory Visit (INDEPENDENT_AMBULATORY_CARE_PROVIDER_SITE_OTHER): Payer: BC Managed Care – PPO

## 2022-08-01 DIAGNOSIS — Z23 Encounter for immunization: Secondary | ICD-10-CM

## 2022-08-01 DIAGNOSIS — E1165 Type 2 diabetes mellitus with hyperglycemia: Secondary | ICD-10-CM | POA: Diagnosis not present

## 2022-08-01 DIAGNOSIS — E7800 Pure hypercholesterolemia, unspecified: Secondary | ICD-10-CM | POA: Diagnosis not present

## 2022-08-01 LAB — CBC WITH DIFFERENTIAL/PLATELET
Basophils Absolute: 0 10*3/uL (ref 0.0–0.1)
Basophils Relative: 0.3 % (ref 0.0–3.0)
Eosinophils Absolute: 0.2 10*3/uL (ref 0.0–0.7)
Eosinophils Relative: 4.1 % (ref 0.0–5.0)
HCT: 39.8 % (ref 36.0–46.0)
Hemoglobin: 13.4 g/dL (ref 12.0–15.0)
Lymphocytes Relative: 42.2 % (ref 12.0–46.0)
Lymphs Abs: 2.3 10*3/uL (ref 0.7–4.0)
MCHC: 33.7 g/dL (ref 30.0–36.0)
MCV: 87.2 fl (ref 78.0–100.0)
Monocytes Absolute: 0.4 10*3/uL (ref 0.1–1.0)
Monocytes Relative: 7.6 % (ref 3.0–12.0)
Neutro Abs: 2.5 10*3/uL (ref 1.4–7.7)
Neutrophils Relative %: 45.8 % (ref 43.0–77.0)
Platelets: 315 10*3/uL (ref 150.0–400.0)
RBC: 4.56 Mil/uL (ref 3.87–5.11)
RDW: 14.3 % (ref 11.5–15.5)
WBC: 5.4 10*3/uL (ref 4.0–10.5)

## 2022-08-01 LAB — COMPREHENSIVE METABOLIC PANEL
ALT: 18 U/L (ref 0–35)
AST: 15 U/L (ref 0–37)
Albumin: 3.9 g/dL (ref 3.5–5.2)
Alkaline Phosphatase: 72 U/L (ref 39–117)
BUN: 12 mg/dL (ref 6–23)
CO2: 25 mEq/L (ref 19–32)
Calcium: 9.3 mg/dL (ref 8.4–10.5)
Chloride: 102 mEq/L (ref 96–112)
Creatinine, Ser: 0.8 mg/dL (ref 0.40–1.20)
GFR: 78.85 mL/min (ref >60.00)
Glucose, Bld: 124 mg/dL — ABNORMAL HIGH (ref 70–99)
Potassium: 4 mEq/L (ref 3.5–5.1)
Sodium: 138 mEq/L (ref 135–145)
Total Bilirubin: 0.4 mg/dL (ref 0.2–1.2)
Total Protein: 6.7 g/dL (ref 6.0–8.3)

## 2022-08-01 LAB — URINALYSIS, ROUTINE W REFLEX MICROSCOPIC
Bilirubin Urine: NEGATIVE
Hgb urine dipstick: NEGATIVE
Ketones, ur: NEGATIVE
Leukocytes,Ua: NEGATIVE
Nitrite: NEGATIVE
RBC / HPF: NONE SEEN (ref 0–?)
Specific Gravity, Urine: 1.02 (ref 1.000–1.030)
Total Protein, Urine: NEGATIVE
Urine Glucose: NEGATIVE
Urobilinogen, UA: 0.2 (ref 0.0–1.0)
pH: 6 (ref 5.0–8.0)

## 2022-08-01 LAB — LIPID PANEL
Cholesterol: 169 mg/dL (ref 0–200)
HDL: 52.6 mg/dL (ref >39.00)
LDL Cholesterol: 92 mg/dL (ref 0–99)
NonHDL: 116.21
Total CHOL/HDL Ratio: 3
Triglycerides: 122 mg/dL (ref 0.0–149.0)
VLDL: 24.4 mg/dL (ref 0.0–40.0)

## 2022-08-01 LAB — MICROALBUMIN / CREATININE URINE RATIO
Creatinine,U: 139.8 mg/dL
Microalb Creat Ratio: 0.5 mg/g (ref 0.0–30.0)
Microalb, Ur: <0.7 mg/dL (ref 0.0–1.9)

## 2022-08-01 LAB — HEMOGLOBIN A1C: Hgb A1c MFr Bld: 6.9 % — ABNORMAL HIGH (ref 4.6–6.5)

## 2022-08-01 NOTE — Progress Notes (Signed)
Pt is here for labs   

## 2022-08-01 NOTE — Progress Notes (Signed)
Per orders of Dr.Kremer, pt is here for Tdap vaccine. pt received Tdap vaccine in right deltoid at 8:40 am. Given by [place] L. CMA/CPT. Pt tolerated Tdap vacine well.

## 2022-08-02 MED ORDER — ATORVASTATIN CALCIUM 40 MG PO TABS: 40.0000 mg | ORAL_TABLET | Freq: Every day | ORAL | 3 refills | Status: AC

## 2022-08-02 NOTE — Addendum Note (Signed)
Addended by: Jon Billings on: 08/02/2022 08:10 AM   Modules accepted: Orders

## 2022-08-07 DIAGNOSIS — J3089 Other allergic rhinitis: Secondary | ICD-10-CM | POA: Diagnosis not present

## 2022-08-07 DIAGNOSIS — J3081 Allergic rhinitis due to animal (cat) (dog) hair and dander: Secondary | ICD-10-CM | POA: Diagnosis not present

## 2022-08-07 DIAGNOSIS — J301 Allergic rhinitis due to pollen: Secondary | ICD-10-CM | POA: Diagnosis not present

## 2022-08-09 ENCOUNTER — Other Ambulatory Visit: Payer: Self-pay | Admitting: Family Medicine

## 2022-08-09 DIAGNOSIS — H8309 Labyrinthitis, unspecified ear: Secondary | ICD-10-CM

## 2022-08-15 DIAGNOSIS — J301 Allergic rhinitis due to pollen: Secondary | ICD-10-CM | POA: Diagnosis not present

## 2022-08-15 DIAGNOSIS — J3081 Allergic rhinitis due to animal (cat) (dog) hair and dander: Secondary | ICD-10-CM | POA: Diagnosis not present

## 2022-08-15 DIAGNOSIS — H16143 Punctate keratitis, bilateral: Secondary | ICD-10-CM | POA: Diagnosis not present

## 2022-08-15 DIAGNOSIS — J3089 Other allergic rhinitis: Secondary | ICD-10-CM | POA: Diagnosis not present

## 2022-08-22 DIAGNOSIS — J3081 Allergic rhinitis due to animal (cat) (dog) hair and dander: Secondary | ICD-10-CM | POA: Diagnosis not present

## 2022-08-22 DIAGNOSIS — J3089 Other allergic rhinitis: Secondary | ICD-10-CM | POA: Diagnosis not present

## 2022-08-22 DIAGNOSIS — J301 Allergic rhinitis due to pollen: Secondary | ICD-10-CM | POA: Diagnosis not present

## 2022-08-23 DIAGNOSIS — J301 Allergic rhinitis due to pollen: Secondary | ICD-10-CM | POA: Diagnosis not present

## 2022-08-24 DIAGNOSIS — J3089 Other allergic rhinitis: Secondary | ICD-10-CM | POA: Diagnosis not present

## 2022-08-28 DIAGNOSIS — J301 Allergic rhinitis due to pollen: Secondary | ICD-10-CM | POA: Diagnosis not present

## 2022-08-28 DIAGNOSIS — J3089 Other allergic rhinitis: Secondary | ICD-10-CM | POA: Diagnosis not present

## 2022-08-28 DIAGNOSIS — J3081 Allergic rhinitis due to animal (cat) (dog) hair and dander: Secondary | ICD-10-CM | POA: Diagnosis not present

## 2022-09-04 DIAGNOSIS — J3089 Other allergic rhinitis: Secondary | ICD-10-CM | POA: Diagnosis not present

## 2022-09-04 DIAGNOSIS — J3081 Allergic rhinitis due to animal (cat) (dog) hair and dander: Secondary | ICD-10-CM | POA: Diagnosis not present

## 2022-09-04 DIAGNOSIS — J301 Allergic rhinitis due to pollen: Secondary | ICD-10-CM | POA: Diagnosis not present

## 2022-09-11 DIAGNOSIS — J301 Allergic rhinitis due to pollen: Secondary | ICD-10-CM | POA: Diagnosis not present

## 2022-09-11 DIAGNOSIS — J3089 Other allergic rhinitis: Secondary | ICD-10-CM | POA: Diagnosis not present

## 2022-09-11 DIAGNOSIS — J3081 Allergic rhinitis due to animal (cat) (dog) hair and dander: Secondary | ICD-10-CM | POA: Diagnosis not present

## 2022-09-18 DIAGNOSIS — J3081 Allergic rhinitis due to animal (cat) (dog) hair and dander: Secondary | ICD-10-CM | POA: Diagnosis not present

## 2022-09-18 DIAGNOSIS — J3089 Other allergic rhinitis: Secondary | ICD-10-CM | POA: Diagnosis not present

## 2022-09-18 DIAGNOSIS — J301 Allergic rhinitis due to pollen: Secondary | ICD-10-CM | POA: Diagnosis not present

## 2022-09-21 DIAGNOSIS — H1132 Conjunctival hemorrhage, left eye: Secondary | ICD-10-CM | POA: Diagnosis not present

## 2022-09-21 DIAGNOSIS — H16143 Punctate keratitis, bilateral: Secondary | ICD-10-CM | POA: Diagnosis not present

## 2022-09-24 ENCOUNTER — Other Ambulatory Visit: Payer: Self-pay | Admitting: Family Medicine

## 2022-09-24 DIAGNOSIS — K219 Gastro-esophageal reflux disease without esophagitis: Secondary | ICD-10-CM

## 2022-09-25 DIAGNOSIS — J301 Allergic rhinitis due to pollen: Secondary | ICD-10-CM | POA: Diagnosis not present

## 2022-09-25 DIAGNOSIS — J3081 Allergic rhinitis due to animal (cat) (dog) hair and dander: Secondary | ICD-10-CM | POA: Diagnosis not present

## 2022-09-25 DIAGNOSIS — J3089 Other allergic rhinitis: Secondary | ICD-10-CM | POA: Diagnosis not present

## 2022-10-03 DIAGNOSIS — J3081 Allergic rhinitis due to animal (cat) (dog) hair and dander: Secondary | ICD-10-CM | POA: Diagnosis not present

## 2022-10-03 DIAGNOSIS — J3089 Other allergic rhinitis: Secondary | ICD-10-CM | POA: Diagnosis not present

## 2022-10-03 DIAGNOSIS — J301 Allergic rhinitis due to pollen: Secondary | ICD-10-CM | POA: Diagnosis not present

## 2022-10-04 ENCOUNTER — Other Ambulatory Visit: Payer: Self-pay | Admitting: Family Medicine

## 2022-10-04 ENCOUNTER — Encounter: Payer: Self-pay | Admitting: Family Medicine

## 2022-10-04 DIAGNOSIS — E1165 Type 2 diabetes mellitus with hyperglycemia: Secondary | ICD-10-CM

## 2022-10-10 DIAGNOSIS — J301 Allergic rhinitis due to pollen: Secondary | ICD-10-CM | POA: Diagnosis not present

## 2022-10-10 DIAGNOSIS — J3081 Allergic rhinitis due to animal (cat) (dog) hair and dander: Secondary | ICD-10-CM | POA: Diagnosis not present

## 2022-10-10 DIAGNOSIS — J3089 Other allergic rhinitis: Secondary | ICD-10-CM | POA: Diagnosis not present

## 2022-10-17 DIAGNOSIS — J3081 Allergic rhinitis due to animal (cat) (dog) hair and dander: Secondary | ICD-10-CM | POA: Diagnosis not present

## 2022-10-17 DIAGNOSIS — J301 Allergic rhinitis due to pollen: Secondary | ICD-10-CM | POA: Diagnosis not present

## 2022-10-17 DIAGNOSIS — J3089 Other allergic rhinitis: Secondary | ICD-10-CM | POA: Diagnosis not present

## 2022-10-19 ENCOUNTER — Encounter: Payer: Self-pay | Admitting: Family Medicine

## 2022-10-19 ENCOUNTER — Other Ambulatory Visit: Payer: Self-pay | Admitting: Family Medicine

## 2022-10-19 NOTE — Telephone Encounter (Signed)
Refill request for pending Rx last OV 06/30/22 last refill 09/22/21. Please advise

## 2022-10-19 NOTE — Telephone Encounter (Signed)
Duplicate/error message requested med refilled for next 3 months. Patient needing different Rx.

## 2022-10-23 DIAGNOSIS — J3089 Other allergic rhinitis: Secondary | ICD-10-CM | POA: Diagnosis not present

## 2022-10-23 DIAGNOSIS — J3081 Allergic rhinitis due to animal (cat) (dog) hair and dander: Secondary | ICD-10-CM | POA: Diagnosis not present

## 2022-10-23 DIAGNOSIS — J301 Allergic rhinitis due to pollen: Secondary | ICD-10-CM | POA: Diagnosis not present

## 2022-10-25 ENCOUNTER — Other Ambulatory Visit: Payer: Self-pay | Admitting: Family Medicine

## 2022-10-30 DIAGNOSIS — J3081 Allergic rhinitis due to animal (cat) (dog) hair and dander: Secondary | ICD-10-CM | POA: Diagnosis not present

## 2022-10-30 DIAGNOSIS — J3089 Other allergic rhinitis: Secondary | ICD-10-CM | POA: Diagnosis not present

## 2022-10-30 DIAGNOSIS — J301 Allergic rhinitis due to pollen: Secondary | ICD-10-CM | POA: Diagnosis not present

## 2022-11-06 DIAGNOSIS — J3089 Other allergic rhinitis: Secondary | ICD-10-CM | POA: Diagnosis not present

## 2022-11-06 DIAGNOSIS — J3081 Allergic rhinitis due to animal (cat) (dog) hair and dander: Secondary | ICD-10-CM | POA: Diagnosis not present

## 2022-11-06 DIAGNOSIS — J301 Allergic rhinitis due to pollen: Secondary | ICD-10-CM | POA: Diagnosis not present

## 2022-11-09 ENCOUNTER — Encounter: Payer: Self-pay | Admitting: Family Medicine

## 2022-11-09 ENCOUNTER — Other Ambulatory Visit: Payer: Self-pay

## 2022-11-13 ENCOUNTER — Encounter: Payer: Self-pay | Admitting: Family Medicine

## 2022-11-13 DIAGNOSIS — J3081 Allergic rhinitis due to animal (cat) (dog) hair and dander: Secondary | ICD-10-CM | POA: Diagnosis not present

## 2022-11-13 DIAGNOSIS — J3089 Other allergic rhinitis: Secondary | ICD-10-CM | POA: Diagnosis not present

## 2022-11-13 DIAGNOSIS — J301 Allergic rhinitis due to pollen: Secondary | ICD-10-CM | POA: Diagnosis not present

## 2022-11-20 DIAGNOSIS — J301 Allergic rhinitis due to pollen: Secondary | ICD-10-CM | POA: Diagnosis not present

## 2022-11-20 DIAGNOSIS — J3081 Allergic rhinitis due to animal (cat) (dog) hair and dander: Secondary | ICD-10-CM | POA: Diagnosis not present

## 2022-11-20 DIAGNOSIS — J3089 Other allergic rhinitis: Secondary | ICD-10-CM | POA: Diagnosis not present

## 2022-11-27 DIAGNOSIS — J301 Allergic rhinitis due to pollen: Secondary | ICD-10-CM | POA: Diagnosis not present

## 2022-11-27 DIAGNOSIS — J3081 Allergic rhinitis due to animal (cat) (dog) hair and dander: Secondary | ICD-10-CM | POA: Diagnosis not present

## 2022-11-27 DIAGNOSIS — J3089 Other allergic rhinitis: Secondary | ICD-10-CM | POA: Diagnosis not present

## 2022-11-28 ENCOUNTER — Ambulatory Visit (INDEPENDENT_AMBULATORY_CARE_PROVIDER_SITE_OTHER): Payer: BC Managed Care – PPO

## 2022-11-28 VITALS — BP 126/73 | HR 79 | Wt 201.0 lb

## 2022-11-28 DIAGNOSIS — R350 Frequency of micturition: Secondary | ICD-10-CM | POA: Diagnosis not present

## 2022-11-28 LAB — POCT URINALYSIS DIPSTICK
Bilirubin, UA: NEGATIVE
Blood, UA: NEGATIVE
Glucose, UA: NEGATIVE
Ketones, UA: NEGATIVE
Leukocytes, UA: NEGATIVE
Nitrite, UA: NEGATIVE
Protein, UA: NEGATIVE
Spec Grav, UA: >=1.03 — AB (ref 1.010–1.025)
Urobilinogen, UA: 0.2 E.U./dL
pH, UA: 5 (ref 5.0–8.0)

## 2022-11-28 NOTE — Addendum Note (Signed)
Addended by: Lorelle Gibbs L on: 11/28/2022 01:13 PM   Modules accepted: Orders

## 2022-11-28 NOTE — Progress Notes (Signed)
SUBJECTIVE: Grace Gregory is a 62 y.o. female who complains of urinary frequency, urgency  x 2 weeks. without flank pain, fever, chills, or abnormal vaginal discharge or bleeding.   OBJECTIVE: Appears well, in no apparent distress.  Vital signs are normal. Urine dipstick shows negative for all components.    ASSESSMENT: Dysuria  PLAN: Urine culture was sent to the lab. Call or return to clinic prn if these symptoms worsen or fail to improve as anticipated.   Delmas Faucett l Brant Peets, CMA

## 2022-11-30 LAB — URINE CULTURE: Organism ID, Bacteria: NO GROWTH

## 2022-12-04 DIAGNOSIS — J301 Allergic rhinitis due to pollen: Secondary | ICD-10-CM | POA: Diagnosis not present

## 2022-12-04 DIAGNOSIS — J3081 Allergic rhinitis due to animal (cat) (dog) hair and dander: Secondary | ICD-10-CM | POA: Diagnosis not present

## 2022-12-04 DIAGNOSIS — J3089 Other allergic rhinitis: Secondary | ICD-10-CM | POA: Diagnosis not present

## 2022-12-11 DIAGNOSIS — J3089 Other allergic rhinitis: Secondary | ICD-10-CM | POA: Diagnosis not present

## 2022-12-11 DIAGNOSIS — J3081 Allergic rhinitis due to animal (cat) (dog) hair and dander: Secondary | ICD-10-CM | POA: Diagnosis not present

## 2022-12-11 DIAGNOSIS — J301 Allergic rhinitis due to pollen: Secondary | ICD-10-CM | POA: Diagnosis not present

## 2022-12-18 ENCOUNTER — Encounter: Payer: Self-pay | Admitting: Family Medicine

## 2022-12-18 ENCOUNTER — Other Ambulatory Visit: Payer: Self-pay

## 2022-12-18 ENCOUNTER — Other Ambulatory Visit: Payer: Self-pay | Admitting: Family Medicine

## 2022-12-18 DIAGNOSIS — J3089 Other allergic rhinitis: Secondary | ICD-10-CM | POA: Diagnosis not present

## 2022-12-18 DIAGNOSIS — J301 Allergic rhinitis due to pollen: Secondary | ICD-10-CM | POA: Diagnosis not present

## 2022-12-18 DIAGNOSIS — J3081 Allergic rhinitis due to animal (cat) (dog) hair and dander: Secondary | ICD-10-CM | POA: Diagnosis not present

## 2022-12-19 ENCOUNTER — Encounter: Payer: Self-pay | Admitting: Family Medicine

## 2022-12-19 ENCOUNTER — Other Ambulatory Visit: Payer: Self-pay | Admitting: Family Medicine

## 2022-12-20 ENCOUNTER — Encounter: Payer: Self-pay | Admitting: Obstetrics & Gynecology

## 2022-12-20 ENCOUNTER — Ambulatory Visit (INDEPENDENT_AMBULATORY_CARE_PROVIDER_SITE_OTHER): Payer: BC Managed Care – PPO | Admitting: Obstetrics & Gynecology

## 2022-12-20 VITALS — BP 141/76 | HR 71 | Wt 203.0 lb

## 2022-12-20 DIAGNOSIS — N951 Menopausal and female climacteric states: Secondary | ICD-10-CM | POA: Diagnosis not present

## 2022-12-20 DIAGNOSIS — Z01419 Encounter for gynecological examination (general) (routine) without abnormal findings: Secondary | ICD-10-CM

## 2022-12-20 DIAGNOSIS — E2839 Other primary ovarian failure: Secondary | ICD-10-CM

## 2022-12-20 MED ORDER — ESTRADIOL 0.05 MG/24HR TD PTTW
1.0000 | MEDICATED_PATCH | TRANSDERMAL | 4 refills | Status: DC
Start: 2022-12-21 — End: 2023-07-13

## 2022-12-20 NOTE — Progress Notes (Signed)
Subjective:     Grace Gregory is a 62 y.o. female here for a routine exam.  Current complaints: pt reports occ hot flushes. Nothing like prev. Reports that it is tolerable for now and she is content at her current dosage.      Gynecologic History No LMP recorded (lmp unknown). Patient has had a hysterectomy. Contraception: none Last Pap: n/a.  Last mammogram: 11/14/2020. Results were: normal  Obstetric History OB History  Gravida Para Term Preterm AB Living  1       1    SAB IAB Ectopic Multiple Live Births  1            # Outcome Date GA Lbr Len/2nd Weight Sex Type Anes PTL Lv  1 SAB 2001             The following portions of the patient's history were reviewed and updated as appropriate: allergies, current medications, past family history, past medical history, past social history, past surgical history, and problem list.  Review of Systems Pertinent items are noted in HPI.    Objective:  BP (!) 141/76   Pulse 71   Wt 203 lb (92.1 kg)   LMP  (LMP Unknown)   BMI 37.13 kg/m   General Appearance:    Alert, cooperative, no distress, appears stated age  Head:    Normocephalic, without obvious abnormality, atraumatic  Eyes:    conjunctiva/corneas clear, EOM's intact, both eyes  Ears:    Normal external ear canals, both ears  Nose:   Nares normal, septum midline, mucosa normal, no drainage    or sinus tenderness  Throat:   Lips, mucosa, and tongue normal; teeth and gums normal  Neck:   Supple, symmetrical, trachea midline, no adenopathy;    thyroid:  no enlargement/tenderness/nodules  Back:     Symmetric, no curvature, ROM normal, no CVA tenderness  Lungs:     respirations unlabored  Chest Wall:    No tenderness or deformity   Heart:    Regular rate and rhythm  Breast Exam:    No tenderness, masses, or nipple abnormality  Abdomen:     Soft, non-tender, bowel sounds active all four quadrants,    no masses, no organomegaly  Genitalia:    Normal female without lesion,  discharge or tenderness   Uterus and cervix surgically absent    Extremities:   Extremities normal, atraumatic, no cyanosis or edema  Pulses:   2+ and symmetric all extremities  Skin:   Skin color, texture, turgor normal, no rashes or lesions     Assessment:    Healthy female exam.    Plan:  Diagnoses and all orders for this visit:  Well female exam with routine gynecological exam  Menopausal hot flushes  Estrogen deficiency -     estradiol (VIVELLE-DOT) 0.05 MG/24HR patch; Place 1 patch (0.05 mg total) onto the skin 2 (two) times a week.    L. Harraway-Smith, M.D., [facility]

## 2022-12-21 ENCOUNTER — Other Ambulatory Visit (HOSPITAL_BASED_OUTPATIENT_CLINIC_OR_DEPARTMENT_OTHER): Payer: Self-pay

## 2022-12-21 DIAGNOSIS — L2089 Other atopic dermatitis: Secondary | ICD-10-CM | POA: Diagnosis not present

## 2022-12-21 DIAGNOSIS — J3089 Other allergic rhinitis: Secondary | ICD-10-CM | POA: Diagnosis not present

## 2022-12-21 DIAGNOSIS — J301 Allergic rhinitis due to pollen: Secondary | ICD-10-CM | POA: Diagnosis not present

## 2022-12-21 DIAGNOSIS — J4540 Moderate persistent asthma, uncomplicated: Secondary | ICD-10-CM | POA: Diagnosis not present

## 2022-12-21 MED ORDER — MONTELUKAST SODIUM 10 MG PO TABS
10.0000 mg | ORAL_TABLET | Freq: Every day | ORAL | 5 refills | Status: DC
Start: 2022-12-21 — End: 2024-02-15
  Filled 2022-12-21 (×2): qty 30, 30d supply, fill #0

## 2022-12-21 MED ORDER — ALBUTEROL SULFATE HFA 108 (90 BASE) MCG/ACT IN AERS
2.0000 | INHALATION_SPRAY | RESPIRATORY_TRACT | 0 refills | Status: DC | PRN
Start: 2022-12-21 — End: 2023-08-15
  Filled 2022-12-21: qty 6.7, 30d supply, fill #0

## 2022-12-21 MED ORDER — CETIRIZINE HCL 10 MG PO TABS
10.0000 mg | ORAL_TABLET | Freq: Every day | ORAL | 5 refills | Status: DC
Start: 2022-12-21 — End: 2024-02-15
  Filled 2022-12-21: qty 30, 30d supply, fill #0

## 2022-12-21 MED ORDER — TRIAMCINOLONE ACETONIDE 0.1 % EX OINT
TOPICAL_OINTMENT | CUTANEOUS | 3 refills | Status: AC
  Filled 2022-12-21: qty 60, 90d supply, fill #0

## 2022-12-22 ENCOUNTER — Other Ambulatory Visit (HOSPITAL_BASED_OUTPATIENT_CLINIC_OR_DEPARTMENT_OTHER): Payer: Self-pay

## 2022-12-25 ENCOUNTER — Encounter: Payer: Self-pay | Admitting: Family Medicine

## 2022-12-25 ENCOUNTER — Other Ambulatory Visit: Payer: Self-pay | Admitting: Family Medicine

## 2022-12-25 DIAGNOSIS — J3089 Other allergic rhinitis: Secondary | ICD-10-CM | POA: Diagnosis not present

## 2022-12-25 DIAGNOSIS — J301 Allergic rhinitis due to pollen: Secondary | ICD-10-CM | POA: Diagnosis not present

## 2022-12-25 DIAGNOSIS — J3081 Allergic rhinitis due to animal (cat) (dog) hair and dander: Secondary | ICD-10-CM | POA: Diagnosis not present

## 2022-12-25 DIAGNOSIS — E1165 Type 2 diabetes mellitus with hyperglycemia: Secondary | ICD-10-CM

## 2022-12-25 NOTE — Telephone Encounter (Signed)
Patient is aware refills have been sent for her Hydrochlorothiazide medication and she will call the pharmacy to make sure they received it this time.

## 2023-01-01 DIAGNOSIS — J301 Allergic rhinitis due to pollen: Secondary | ICD-10-CM | POA: Diagnosis not present

## 2023-01-01 DIAGNOSIS — J3089 Other allergic rhinitis: Secondary | ICD-10-CM | POA: Diagnosis not present

## 2023-01-01 DIAGNOSIS — J3081 Allergic rhinitis due to animal (cat) (dog) hair and dander: Secondary | ICD-10-CM | POA: Diagnosis not present

## 2023-01-09 DIAGNOSIS — J3081 Allergic rhinitis due to animal (cat) (dog) hair and dander: Secondary | ICD-10-CM | POA: Diagnosis not present

## 2023-01-09 DIAGNOSIS — J3089 Other allergic rhinitis: Secondary | ICD-10-CM | POA: Diagnosis not present

## 2023-01-09 DIAGNOSIS — J301 Allergic rhinitis due to pollen: Secondary | ICD-10-CM | POA: Diagnosis not present

## 2023-01-15 DIAGNOSIS — J301 Allergic rhinitis due to pollen: Secondary | ICD-10-CM | POA: Diagnosis not present

## 2023-01-15 DIAGNOSIS — J3081 Allergic rhinitis due to animal (cat) (dog) hair and dander: Secondary | ICD-10-CM | POA: Diagnosis not present

## 2023-01-15 DIAGNOSIS — J3089 Other allergic rhinitis: Secondary | ICD-10-CM | POA: Diagnosis not present

## 2023-01-16 ENCOUNTER — Other Ambulatory Visit: Payer: Self-pay | Admitting: Family Medicine

## 2023-01-23 DIAGNOSIS — J3089 Other allergic rhinitis: Secondary | ICD-10-CM | POA: Diagnosis not present

## 2023-01-23 DIAGNOSIS — J3081 Allergic rhinitis due to animal (cat) (dog) hair and dander: Secondary | ICD-10-CM | POA: Diagnosis not present

## 2023-01-23 DIAGNOSIS — J301 Allergic rhinitis due to pollen: Secondary | ICD-10-CM | POA: Diagnosis not present

## 2023-01-30 DIAGNOSIS — J301 Allergic rhinitis due to pollen: Secondary | ICD-10-CM | POA: Diagnosis not present

## 2023-01-30 DIAGNOSIS — J3089 Other allergic rhinitis: Secondary | ICD-10-CM | POA: Diagnosis not present

## 2023-01-30 DIAGNOSIS — J3081 Allergic rhinitis due to animal (cat) (dog) hair and dander: Secondary | ICD-10-CM | POA: Diagnosis not present

## 2023-02-05 DIAGNOSIS — J3081 Allergic rhinitis due to animal (cat) (dog) hair and dander: Secondary | ICD-10-CM | POA: Diagnosis not present

## 2023-02-05 DIAGNOSIS — J301 Allergic rhinitis due to pollen: Secondary | ICD-10-CM | POA: Diagnosis not present

## 2023-02-05 DIAGNOSIS — J3089 Other allergic rhinitis: Secondary | ICD-10-CM | POA: Diagnosis not present

## 2023-02-12 DIAGNOSIS — J301 Allergic rhinitis due to pollen: Secondary | ICD-10-CM | POA: Diagnosis not present

## 2023-02-12 DIAGNOSIS — J3081 Allergic rhinitis due to animal (cat) (dog) hair and dander: Secondary | ICD-10-CM | POA: Diagnosis not present

## 2023-02-13 DIAGNOSIS — J3089 Other allergic rhinitis: Secondary | ICD-10-CM | POA: Diagnosis not present

## 2023-02-14 DIAGNOSIS — J3089 Other allergic rhinitis: Secondary | ICD-10-CM | POA: Diagnosis not present

## 2023-02-14 DIAGNOSIS — J301 Allergic rhinitis due to pollen: Secondary | ICD-10-CM | POA: Diagnosis not present

## 2023-02-14 DIAGNOSIS — J3081 Allergic rhinitis due to animal (cat) (dog) hair and dander: Secondary | ICD-10-CM | POA: Diagnosis not present

## 2023-02-19 ENCOUNTER — Encounter: Payer: Self-pay | Admitting: Family Medicine

## 2023-02-19 DIAGNOSIS — J3089 Other allergic rhinitis: Secondary | ICD-10-CM | POA: Diagnosis not present

## 2023-02-19 DIAGNOSIS — J301 Allergic rhinitis due to pollen: Secondary | ICD-10-CM | POA: Diagnosis not present

## 2023-02-19 DIAGNOSIS — J3081 Allergic rhinitis due to animal (cat) (dog) hair and dander: Secondary | ICD-10-CM | POA: Diagnosis not present

## 2023-02-26 DIAGNOSIS — Z23 Encounter for immunization: Secondary | ICD-10-CM | POA: Diagnosis not present

## 2023-02-26 DIAGNOSIS — J301 Allergic rhinitis due to pollen: Secondary | ICD-10-CM | POA: Diagnosis not present

## 2023-02-26 DIAGNOSIS — J3081 Allergic rhinitis due to animal (cat) (dog) hair and dander: Secondary | ICD-10-CM | POA: Diagnosis not present

## 2023-02-26 DIAGNOSIS — J3089 Other allergic rhinitis: Secondary | ICD-10-CM | POA: Diagnosis not present

## 2023-03-06 DIAGNOSIS — J301 Allergic rhinitis due to pollen: Secondary | ICD-10-CM | POA: Diagnosis not present

## 2023-03-06 DIAGNOSIS — J3089 Other allergic rhinitis: Secondary | ICD-10-CM | POA: Diagnosis not present

## 2023-03-06 DIAGNOSIS — J3081 Allergic rhinitis due to animal (cat) (dog) hair and dander: Secondary | ICD-10-CM | POA: Diagnosis not present

## 2023-03-09 ENCOUNTER — Other Ambulatory Visit: Payer: Self-pay | Admitting: Family Medicine

## 2023-03-12 ENCOUNTER — Other Ambulatory Visit: Payer: Self-pay | Admitting: Family Medicine

## 2023-03-13 DIAGNOSIS — J3089 Other allergic rhinitis: Secondary | ICD-10-CM | POA: Diagnosis not present

## 2023-03-13 DIAGNOSIS — J3081 Allergic rhinitis due to animal (cat) (dog) hair and dander: Secondary | ICD-10-CM | POA: Diagnosis not present

## 2023-03-13 DIAGNOSIS — J301 Allergic rhinitis due to pollen: Secondary | ICD-10-CM | POA: Diagnosis not present

## 2023-03-19 ENCOUNTER — Other Ambulatory Visit: Payer: Self-pay | Admitting: Family Medicine

## 2023-03-19 DIAGNOSIS — J301 Allergic rhinitis due to pollen: Secondary | ICD-10-CM | POA: Diagnosis not present

## 2023-03-19 DIAGNOSIS — E1165 Type 2 diabetes mellitus with hyperglycemia: Secondary | ICD-10-CM

## 2023-03-19 DIAGNOSIS — J3089 Other allergic rhinitis: Secondary | ICD-10-CM | POA: Diagnosis not present

## 2023-03-19 DIAGNOSIS — J3081 Allergic rhinitis due to animal (cat) (dog) hair and dander: Secondary | ICD-10-CM | POA: Diagnosis not present

## 2023-03-26 DIAGNOSIS — J3081 Allergic rhinitis due to animal (cat) (dog) hair and dander: Secondary | ICD-10-CM | POA: Diagnosis not present

## 2023-03-26 DIAGNOSIS — J3089 Other allergic rhinitis: Secondary | ICD-10-CM | POA: Diagnosis not present

## 2023-03-26 DIAGNOSIS — J301 Allergic rhinitis due to pollen: Secondary | ICD-10-CM | POA: Diagnosis not present

## 2023-04-02 ENCOUNTER — Encounter: Payer: Self-pay | Admitting: Family Medicine

## 2023-04-02 ENCOUNTER — Other Ambulatory Visit: Payer: Self-pay | Admitting: Family Medicine

## 2023-04-02 DIAGNOSIS — J3081 Allergic rhinitis due to animal (cat) (dog) hair and dander: Secondary | ICD-10-CM | POA: Diagnosis not present

## 2023-04-02 DIAGNOSIS — J3089 Other allergic rhinitis: Secondary | ICD-10-CM | POA: Diagnosis not present

## 2023-04-02 DIAGNOSIS — J301 Allergic rhinitis due to pollen: Secondary | ICD-10-CM | POA: Diagnosis not present

## 2023-04-02 NOTE — Telephone Encounter (Signed)
Refill request for  Hydrochlorthiazide 12.5 mg LR   12/25/22, #90, 0 rf LVO 06/30/22 FOV  none scheduled  Please review and advise.  Thanks. Dm/cma

## 2023-04-09 DIAGNOSIS — J301 Allergic rhinitis due to pollen: Secondary | ICD-10-CM | POA: Diagnosis not present

## 2023-04-09 DIAGNOSIS — J3089 Other allergic rhinitis: Secondary | ICD-10-CM | POA: Diagnosis not present

## 2023-04-09 DIAGNOSIS — J3081 Allergic rhinitis due to animal (cat) (dog) hair and dander: Secondary | ICD-10-CM | POA: Diagnosis not present

## 2023-04-17 ENCOUNTER — Other Ambulatory Visit: Payer: Self-pay | Admitting: Family Medicine

## 2023-04-23 DIAGNOSIS — J3081 Allergic rhinitis due to animal (cat) (dog) hair and dander: Secondary | ICD-10-CM | POA: Diagnosis not present

## 2023-04-23 DIAGNOSIS — J301 Allergic rhinitis due to pollen: Secondary | ICD-10-CM | POA: Diagnosis not present

## 2023-04-23 DIAGNOSIS — J3089 Other allergic rhinitis: Secondary | ICD-10-CM | POA: Diagnosis not present

## 2023-04-30 DIAGNOSIS — E785 Hyperlipidemia, unspecified: Secondary | ICD-10-CM | POA: Diagnosis not present

## 2023-04-30 DIAGNOSIS — E119 Type 2 diabetes mellitus without complications: Secondary | ICD-10-CM | POA: Diagnosis not present

## 2023-04-30 DIAGNOSIS — Z0000 Encounter for general adult medical examination without abnormal findings: Secondary | ICD-10-CM | POA: Diagnosis not present

## 2023-04-30 DIAGNOSIS — I10 Essential (primary) hypertension: Secondary | ICD-10-CM | POA: Diagnosis not present

## 2023-05-07 DIAGNOSIS — J3089 Other allergic rhinitis: Secondary | ICD-10-CM | POA: Diagnosis not present

## 2023-05-07 DIAGNOSIS — J301 Allergic rhinitis due to pollen: Secondary | ICD-10-CM | POA: Diagnosis not present

## 2023-05-07 DIAGNOSIS — J3081 Allergic rhinitis due to animal (cat) (dog) hair and dander: Secondary | ICD-10-CM | POA: Diagnosis not present

## 2023-05-10 DIAGNOSIS — J069 Acute upper respiratory infection, unspecified: Secondary | ICD-10-CM | POA: Diagnosis not present

## 2023-05-21 DIAGNOSIS — J3081 Allergic rhinitis due to animal (cat) (dog) hair and dander: Secondary | ICD-10-CM | POA: Diagnosis not present

## 2023-05-21 DIAGNOSIS — J3089 Other allergic rhinitis: Secondary | ICD-10-CM | POA: Diagnosis not present

## 2023-05-21 DIAGNOSIS — J301 Allergic rhinitis due to pollen: Secondary | ICD-10-CM | POA: Diagnosis not present

## 2023-05-24 ENCOUNTER — Encounter: Payer: Self-pay | Admitting: Family Medicine

## 2023-05-24 DIAGNOSIS — H35033 Hypertensive retinopathy, bilateral: Secondary | ICD-10-CM | POA: Diagnosis not present

## 2023-05-24 DIAGNOSIS — E119 Type 2 diabetes mellitus without complications: Secondary | ICD-10-CM | POA: Diagnosis not present

## 2023-05-24 LAB — HM DIABETES EYE EXAM

## 2023-06-05 ENCOUNTER — Encounter: Payer: Self-pay | Admitting: Family Medicine

## 2023-06-05 ENCOUNTER — Other Ambulatory Visit: Payer: Self-pay | Admitting: Family Medicine

## 2023-06-05 DIAGNOSIS — J3089 Other allergic rhinitis: Secondary | ICD-10-CM | POA: Diagnosis not present

## 2023-06-05 DIAGNOSIS — J301 Allergic rhinitis due to pollen: Secondary | ICD-10-CM | POA: Diagnosis not present

## 2023-06-05 DIAGNOSIS — J3081 Allergic rhinitis due to animal (cat) (dog) hair and dander: Secondary | ICD-10-CM | POA: Diagnosis not present

## 2023-06-12 DIAGNOSIS — Z1231 Encounter for screening mammogram for malignant neoplasm of breast: Secondary | ICD-10-CM | POA: Diagnosis not present

## 2023-06-12 DIAGNOSIS — Z1239 Encounter for other screening for malignant neoplasm of breast: Secondary | ICD-10-CM | POA: Diagnosis not present

## 2023-06-19 DIAGNOSIS — J3081 Allergic rhinitis due to animal (cat) (dog) hair and dander: Secondary | ICD-10-CM | POA: Diagnosis not present

## 2023-06-19 DIAGNOSIS — J301 Allergic rhinitis due to pollen: Secondary | ICD-10-CM | POA: Diagnosis not present

## 2023-06-19 DIAGNOSIS — J3089 Other allergic rhinitis: Secondary | ICD-10-CM | POA: Diagnosis not present

## 2023-06-21 ENCOUNTER — Encounter: Payer: Self-pay | Admitting: Family Medicine

## 2023-06-21 MED ORDER — HYDROCHLOROTHIAZIDE 12.5 MG PO TABS: 12.5000 mg | ORAL_TABLET | Freq: Every day | ORAL | 0 refills | Status: AC

## 2023-06-21 NOTE — Telephone Encounter (Signed)
Patient is requesting a 90 day supply. Thanks, Bms/CMA

## 2023-07-02 DIAGNOSIS — J301 Allergic rhinitis due to pollen: Secondary | ICD-10-CM | POA: Diagnosis not present

## 2023-07-02 DIAGNOSIS — J3081 Allergic rhinitis due to animal (cat) (dog) hair and dander: Secondary | ICD-10-CM | POA: Diagnosis not present

## 2023-07-02 DIAGNOSIS — J3089 Other allergic rhinitis: Secondary | ICD-10-CM | POA: Diagnosis not present

## 2023-07-08 ENCOUNTER — Other Ambulatory Visit: Payer: Self-pay | Admitting: Family Medicine

## 2023-07-08 DIAGNOSIS — E7800 Pure hypercholesterolemia, unspecified: Secondary | ICD-10-CM

## 2023-07-10 ENCOUNTER — Encounter: Payer: Self-pay | Admitting: Obstetrics & Gynecology

## 2023-07-10 DIAGNOSIS — E2839 Other primary ovarian failure: Secondary | ICD-10-CM

## 2023-07-13 MED ORDER — ESTRADIOL 0.075 MG/24HR TD PTTW: 1.0000 | MEDICATED_PATCH | TRANSDERMAL | 12 refills | Status: DC

## 2023-07-17 DIAGNOSIS — J301 Allergic rhinitis due to pollen: Secondary | ICD-10-CM | POA: Diagnosis not present

## 2023-07-17 DIAGNOSIS — J3081 Allergic rhinitis due to animal (cat) (dog) hair and dander: Secondary | ICD-10-CM | POA: Diagnosis not present

## 2023-07-17 DIAGNOSIS — J3089 Other allergic rhinitis: Secondary | ICD-10-CM | POA: Diagnosis not present

## 2023-07-18 ENCOUNTER — Other Ambulatory Visit: Payer: Self-pay | Admitting: Family Medicine

## 2023-07-30 DIAGNOSIS — R519 Headache, unspecified: Secondary | ICD-10-CM | POA: Diagnosis not present

## 2023-07-30 DIAGNOSIS — J3081 Allergic rhinitis due to animal (cat) (dog) hair and dander: Secondary | ICD-10-CM | POA: Diagnosis not present

## 2023-07-30 DIAGNOSIS — J301 Allergic rhinitis due to pollen: Secondary | ICD-10-CM | POA: Diagnosis not present

## 2023-07-30 DIAGNOSIS — J3089 Other allergic rhinitis: Secondary | ICD-10-CM | POA: Diagnosis not present

## 2023-07-30 DIAGNOSIS — M542 Cervicalgia: Secondary | ICD-10-CM | POA: Diagnosis not present

## 2023-08-09 ENCOUNTER — Telehealth: Payer: Self-pay

## 2023-08-09 NOTE — Telephone Encounter (Signed)
 PA for Trulicity approved from  05/21/23 - 05/21/24.  Dm/cma

## 2023-08-15 ENCOUNTER — Ambulatory Visit: Admitting: Neurology

## 2023-08-15 ENCOUNTER — Encounter: Payer: Self-pay | Admitting: Neurology

## 2023-08-15 VITALS — BP 128/73 | HR 61 | Ht 62.0 in | Wt 196.8 lb

## 2023-08-15 DIAGNOSIS — G4733 Obstructive sleep apnea (adult) (pediatric): Secondary | ICD-10-CM | POA: Diagnosis not present

## 2023-08-15 DIAGNOSIS — J3089 Other allergic rhinitis: Secondary | ICD-10-CM | POA: Diagnosis not present

## 2023-08-15 DIAGNOSIS — Z8489 Family history of other specified conditions: Secondary | ICD-10-CM

## 2023-08-15 DIAGNOSIS — Z8249 Family history of ischemic heart disease and other diseases of the circulatory system: Secondary | ICD-10-CM

## 2023-08-15 DIAGNOSIS — J3081 Allergic rhinitis due to animal (cat) (dog) hair and dander: Secondary | ICD-10-CM | POA: Diagnosis not present

## 2023-08-15 DIAGNOSIS — J301 Allergic rhinitis due to pollen: Secondary | ICD-10-CM | POA: Diagnosis not present

## 2023-08-15 DIAGNOSIS — H5712 Ocular pain, left eye: Secondary | ICD-10-CM

## 2023-08-15 DIAGNOSIS — G4440 Drug-induced headache, not elsewhere classified, not intractable: Secondary | ICD-10-CM

## 2023-08-15 DIAGNOSIS — G44209 Tension-type headache, unspecified, not intractable: Secondary | ICD-10-CM | POA: Diagnosis not present

## 2023-08-15 DIAGNOSIS — G4489 Other headache syndrome: Secondary | ICD-10-CM

## 2023-08-15 DIAGNOSIS — G4486 Cervicogenic headache: Secondary | ICD-10-CM

## 2023-08-15 DIAGNOSIS — G43909 Migraine, unspecified, not intractable, without status migrainosus: Secondary | ICD-10-CM

## 2023-08-15 DIAGNOSIS — R519 Headache, unspecified: Secondary | ICD-10-CM

## 2023-08-15 NOTE — Patient Instructions (Addendum)
 It was nice to meet you today.   As discussed, your headaches are likely due to a combination of factors, these include possible suboptimally controlled obstructive sleep apnea, neck pain, strain on the eyes, migraine headaches, and taking over-the-counter medication on a regular basis.   Here is what we discussed today and my recommendations for you:   Please remember, common headache triggers are: sleep deprivation, dehydration, overheating, stress, hypoglycemia or skipping meals and blood sugar fluctuations, excessive pain medications or excessive alcohol use or caffeine withdrawal. Some people have food triggers such as aged cheese, orange juice or chocolate, especially dark chocolate, or MSG (monosodium glutamate). Try to avoid these headache triggers as much possible. It may be helpful to keep a headache diary to figure out what makes your headaches worse or brings them on and what alleviates them. Some people report headache onset after exercise but studies have shown that regular exercise may actually prevent headaches from coming. If you have exercise-induced headaches, please make sure that you drink plenty of fluid before and after exercising and that you do not over do it and do not overheat. Please make a follow-up appointment with [facility] due to left eye pain and seeing spots in the left eye.  You had also noticed swelling of the eyelid. Please reduce and eliminate daily Excedrin Migraine use as daily use of even over-the-counter medication can perpetuate headaches.  Continue to limit caffeine and continue to hydrate well with water.  Continue to use your CPAP regularly.  Make an appointment with your pulmonologist for sleep apnea follow-up as you have not seen them in over 2 years and you may need an adjustment to your CPAP machine.  Undertreated or suboptimally treated sleep apnea can also perpetuate your headaches. You can continue with the Zanaflex as needed at bedtime.  Please  remember that it can be sedating.  You can utilize [facility] as needed as per PCP prescription. We will do a brain scan, called MRI and call you with the test results. We will have to schedule you for this on a separate date. This test requires authorization from your insurance, and we will take care of the insurance process. We will plan a follow up in about 6 months.

## 2023-08-15 NOTE — Progress Notes (Signed)
 Subjective:    Patient ID: Grace Gregory is a 63 y.o. female.  HPI    Huston Foley, MD, PhD [facility] [address], [address] [address] [place], [place] [address]  Dear Dr. Kateri Plummer,   I saw your patient, Grace Gregory, upon your kind request in my neurologic clinic today for initial consultation of her recurrent headaches.  The patient is unaccompanied today.  As you know, Grace Gregory is a 63 year old female with an underlying complex medical history of hypertension, diabetes, asthma, arthritis, low back pain with status post surgery, cataracts, reflux disease, allergies (on allergy shots), obstructive sleep apnea (on PAP therapy), and obesity, who reports a longstanding history of recurrent headaches.  The used to be infrequent but for the past month she has had more consistent headaches.  They are sometimes in the back of her head, sometimes around the left eye.  She has had some spots in her vision on the left side.  This has started recently in the past month.  She also felt like her eyelid was swollen yesterday.  She has not seen her eye doctor for this but last exam which was her routine follow-up was in January 2025.  She has updated eyeglasses and she has a history of cataracts which are monitored.  She has not discussed her more recent eye pain and vision problems with her ophthalmologist yet, she goes to [facility].  She has not seen her pulmonologist in over 2 years for her sleep apnea and is not sure if she had an adjustment of her PAP machine.  She sleeps quite well, estimates that she gets about 7 hours of sleep and she reports compliance with her PAP machine.  She hydrates well with water.  She does not drink caffeine daily.  She drinks alcohol infrequently, less than once a week.  She has not had any sudden onset of one-sided weakness or numbness or tingling or droopy face or slurring of speech.  Sometimes the headache feels all around the top of  her head like a pressure.  She has had some nausea and rare vomiting.  She has not taken any medication for nausea or vomiting but has a prescription pending through your office for Ubrelvy.  She has a family history of migraines affecting her brother, sister, and niece.  Niece had a brain aneurysm and brother had brain cancer.  She has some neck pain, she has a history of low back pain and had surgery for this.  She has been on gabapentin for years.  I reviewed your office note from 07/30/2023.  She has used Excedrin Migraine.  In fact, she takes 2 Excedrin every day.  She has woken up with headaches.    Of note, she is on gabapentin 300 mg twice daily for back pain.  She is also on Toprol.  An MRI brain was ordered.  However, it was denied by her insurance.  She was started on tizanidine at the time.  As I understand, an appeal was also started for her brain MRI.  She has followed with pulmonology for her sleep apnea, but there is no recent encounter note in her electronic chart.  She had a CT angiogram of the head with and without contrast on 03/07/2019 and I reviewed the results:   IMPRESSION: 1. No acute finding by CT. Extensive chronic small vessel ischemic changes throughout the cerebral hemispheric white matter, in a person with a history of hypertension. 2. No  intracranial large or medium vessel occlusion or correctable proximal stenosis. Distal vessel atherosclerotic irregularity particularly in the PCA branches.   Her Past Medical History Is Significant For: Past Medical History:  Diagnosis Date   Arthritis    Asthma    Diabetes (HCC)    Elevated cholesterol    GERD (gastroesophageal reflux disease)    Hypertension    Obesity    Seasonal allergies    Sleep apnea    On CPAP Machine Pulmonary    Her Past Surgical History Is Significant For: Past Surgical History:  Procedure Laterality Date   ABDOMINAL HYSTERECTOMY     BACK SURGERY     COLONOSCOPY     age 29 in  [place] [facility]    VULVECTOMY N/A 08/06/2018   Procedure: WIDE LOCAL EXCISION VULVA;  Surgeon: Adolphus Birchwood, MD;  Location: [facility];  Service: Gynecology;  Laterality: N/A;    Her Family History Is Significant For: Family History  Problem Relation Age of Onset   Hypertension Mother    Heart disease Father    Hypertension Sister    Hypertension Brother    Hypertension Sister    Hypertension Sister    Brain cancer Brother    Hypertension Brother    Colon cancer Neg Hx    Esophageal cancer Neg Hx    Stomach cancer Neg Hx    Rectal cancer Neg Hx     Her Social History Is Significant For: Social History   Socioeconomic History   Marital status: Married    Spouse name: Not on file   Number of children: Not on file   Years of education: Not on file   Highest education level: Not on file  Occupational History   Not on file  Tobacco Use   Smoking status: Never   Smokeless tobacco: Never  Vaping Use   Vaping status: Never Used  Substance and Sexual Activity   Alcohol use: Yes    Comment: occassionally    Drug use: Never   Sexual activity: Yes    Birth control/protection: None  Other Topics Concern   Not on file  Social History Narrative   Caffiene 1 cup coffee daily   Lives husband, fish   Work: retired.   Social Drivers of Corporate investment banker Strain: Not on file  Food Insecurity: No Food Insecurity (12/13/2020)   [facility], [facility]   Hunger Vital Sign    Worried About Running Out of Food in the Last Year: Never true    Ran Out of Food in the Last Year: Never true  Transportation Needs: Not on file  Physical Activity: Not on file  Stress: Not on file  Social Connections: Unknown (10/04/2021)   Received from [facility], [facility]   Social Network    Social Network: Not on file    Her Allergies Are:  Allergies  Allergen Reactions   Lisinopril Cough   Other Rash   Propoxyphene Rash   Penicillins  Rash  :   Her Current Medications Are:  Outpatient Encounter Medications as of 08/15/2023  Medication Sig   albuterol (PROAIR HFA) 108 (90 Base) MCG/ACT inhaler Inhale 2 puffs by mouth as needed every  4-6 hours as needed for cough/wheeze   AMBULATORY NON FORMULARY MEDICATION Medication Name: Allergy shot x2 a week   atorvastatin (LIPITOR) 40 MG tablet Take 1 tablet (40 mg total) by mouth daily.   AUVI-Q 0.3 MG/0.3ML SOAJ injection  azelastine (ASTELIN) 0.1 % nasal spray Place 1-2 puffs in each nostril Nasally Twice a day   beclomethasone (QVAR) 80 MCG/ACT inhaler Inhale 2 puffs into the lungs in the morning and at bedtime.   Blood Glucose Monitoring Suppl (FREESTYLE LITE) DEVI Use to test blood sugars 1-2 times daily.   cetirizine (ZYRTEC ALLERGY) 10 MG tablet Take 1 tablet by mouth once a day   cetirizine (ZYRTEC ALLERGY) 10 MG tablet Take 1 tablet (10 mg total) by mouth daily.   clobetasol cream (TEMOVATE) 0.05 % Apply 1 application topically 2 (two) times daily. Not for face or private areas.   estradiol (VIVELLE-DOT) 0.075 MG/24HR Place 1 patch onto the skin 2 (two) times a week.   gabapentin (NEURONTIN) 300 MG capsule TAKE ONE CAPSULE BY MOUTH TWICE A DAY   glucose blood (FREESTYLE LITE) test strip Use to test blood sugars 1-2 times daily.   hydrochlorothiazide (HYDRODIURIL) 12.5 MG tablet Take 1 tablet (12.5 mg total) by mouth daily.   Lancets (FREESTYLE) lancets Use to test blood sugars 1-2 times daily.   losartan (COZAAR) 50 MG tablet TAKE ONE TABLET BY MOUTH EVERY DAY   meclizine (ANTIVERT) 25 MG tablet TAKE ONE TABLET BY MOUTH THREE TIMES A DAY AS NEEDED FOR DIZZINESS   metFORMIN (GLUCOPHAGE-XR) 500 MG 24 hr tablet Take 2 tablets (1,000 mg total) by mouth 2 (two) times daily.   metoprolol succinate (TOPROL-XL) 25 MG 24 hr tablet TAKE ONE TABLET BY MOUTH EVERY DAY   montelukast (SINGULAIR) 10 MG tablet Take 1 tablet (10 mg total) by mouth daily.   montelukast (SINGULAIR) 10 MG  tablet Take 1 tablet (10 mg total) by mouth daily.   pantoprazole (PROTONIX) 40 MG tablet TAKE 2 TABLETS BY MOUTH DAILY   tiZANidine (ZANAFLEX) 2 MG tablet Take 2 mg by mouth at bedtime as needed.   triamcinolone ointment (KENALOG) 0.1 % Apply externally twice daily.   TRULICITY 1.5 MG/0.5ML SOAJ SMARTSIG:1 pre-filled pen syringe SUB-Q Once a Week   [DISCONTINUED] TRULICITY 0.75 MG/0.5ML SOAJ INJECT 0.75MG  UNDER THE SKIN ONCE WEEKLY   [DISCONTINUED] albuterol (PROAIR HFA) 108 (90 Base) MCG/ACT inhaler Inhale 2 puffs into the lungs every 4 (four) - 6 (six)  hours as needed for coughing / wheezing   No facility-administered encounter medications on file as of 08/15/2023.  :   Review of Systems:  Out of a complete 14 point review of systems, all are reviewed and negative with the exception of these symptoms as listed below:  Review of Systems  Neurological:        Worsening headaches, fhx aneurysm, brain tumors. Top of head, see's dots in L eye sometimes.  Sore to touch at times, L eye.  R side back of head.  Photo/phono sensitivity. No imaging done.  Migraine cocktail works at ED.      Objective:  Neurological Exam  Physical Exam Physical Examination:   Vitals:   08/15/23 1123  BP: 128/73  Pulse: 61    General Examination: The patient is a very pleasant 63 y.o. female in no acute distress.  She reports some photophobia.  No eye pain.  HEENT: Normocephalic, atraumatic, pupils are equal, round and reactive to light, extraocular tracking is good without limitation to gaze excursion or nystagmus noted.  Funduscopic exam slightly difficult due to cataracts bilaterally.  Hearing is grossly intact in fork.  Face is symmetric with normal facial animation and normal facial sensation to light touch, temperature and vibration sense. Speech is clear with  no dysarthria noted. There is no hypophonia. There is no lip, neck/head, jaw or voice tremor. Neck is supple with full range of passive and  active motion. There are no carotid bruits on auscultation. Oropharynx exam reveals: mild mouth dryness, adequate dental hygiene and moderate airway crowding. Tongue protrudes centrally and palate elevates symmetrically.   Chest: Clear to auscultation without wheezing, rhonchi or crackles noted.  Heart: S1+S2+0, regular and normal without murmurs, rubs or gallops noted.   Abdomen: Soft, non-tender and non-distended.  Extremities: There is no pitting edema in the distal lower extremities bilaterally.   Skin: Warm and dry without trophic changes noted.   Musculoskeletal: exam reveals no obvious joint deformities.   Neurologically:  Mental status: The patient is awake, alert and oriented in all 4 spheres. Her immediate and remote memory, attention, language skills and fund of knowledge are appropriate. There is no evidence of aphasia, agnosia, apraxia or anomia. Speech is clear with normal prosody and enunciation. Thought process is linear. Mood is normal and affect is normal.  Cranial nerves II - XII are as described above under HEENT exam.  Motor exam: Normal bulk, strength and tone is noted. There is no obvious action or resting tremor.  No drift or rebound.  No postural or intention tremor. Fine motor skills and coordination: intact finger taps, hand movements and rapid alternating patting with both upper extremities, normal foot taps bilaterally in the lower extremities.  Cerebellar testing: No dysmetria or intention tremor. There is no truncal or gait ataxia.  Normal finger-to-nose, normal heel-to-shin bilaterally. Reflexes 1-2+ throughout, toes are downgoing bilaterally. Sensory exam: intact to light touch, temperature and vibration sense in the upper and lower extremities.  Romberg negative. Gait, station and balance: She stands easily. No veering to one side is noted. No leaning to one side is noted. Posture is age-appropriate and stance is narrow based. Gait shows normal stride length  and normal pace. No problems turning are noted.  Normal tandem walk.  Assessment and plan:  In summary, Grace Gregory is a very pleasant 63 y.o.-year old female with an underlying complex medical history of hypertension, diabetes, asthma, arthritis, low back pain with status post surgery, cataracts, reflux disease, allergies (on allergy shots), obstructive sleep apnea (on PAP therapy), and obesity, who presents for evaluation of her recurrent headaches.  She has noticed worsening headaches in the past month.  Neurological exam is nonfocal and she is largely reassured.  Her headaches are likely from a combination of factors.  I had a long discussion with the patient about headache triggers and alleviating factors and exacerbating elements.     Below is a summary of my recommendations and our discussion points based on today's visit.  She was given these instructions verbally and also in MyChart in her electronic after visit summary which she can access.  << Please remember, common headache triggers are: sleep deprivation, dehydration, overheating, stress, hypoglycemia or skipping meals and blood sugar fluctuations, excessive pain medications or excessive alcohol use or caffeine withdrawal. Some people have food triggers such as aged cheese, orange juice or chocolate, especially dark chocolate, or MSG (monosodium glutamate). Try to avoid these headache triggers as much possible. It may be helpful to keep a headache diary to figure out what makes your headaches worse or brings them on and what alleviates them. Some people report headache onset after exercise but studies have shown that regular exercise may actually prevent headaches from coming. If you have exercise-induced headaches, please make sure  that you drink plenty of fluid before and after exercising and that you do not over do it and do not overheat. Please make a follow-up appointment with [facility] due to left eye pain and seeing spots  in the left eye.  You had also noticed swelling of the eyelid. Please reduce and eliminate daily Excedrin Migraine use as daily use of even over-the-counter medication can perpetuate headaches.  Continue to limit caffeine and continue to hydrate well with water.  Continue to use your CPAP regularly.  Make an appointment with your pulmonologist for sleep apnea follow-up as you have not seen them in over 2 years and you may need an adjustment to your CPAP machine.  Undertreated or suboptimally treated sleep apnea can also perpetuate your headaches. You can continue with the Zanaflex as needed at bedtime.  Please remember that it can be sedating.  You can utilize [facility] as needed as per PCP prescription. We will do a brain scan, called MRI and call you with the test results. We will have to schedule you for this on a separate date. This test requires authorization from your insurance, and we will take care of the insurance process. We will plan a follow up in about 6 months. >>  This was an extended visit of over 60 minutes due to copious record review involved and considerable counseling and coordination of care, addressing multiple problems.  Thank you very much for allowing me to participate in the care of this nice patient. If I can be of any further assistance to you please do not hesitate to call me at [phone].  Sincerely,   Huston Foley, MD, PhD

## 2023-08-16 DIAGNOSIS — H5712 Ocular pain, left eye: Secondary | ICD-10-CM | POA: Diagnosis not present

## 2023-08-16 DIAGNOSIS — G43909 Migraine, unspecified, not intractable, without status migrainosus: Secondary | ICD-10-CM | POA: Diagnosis not present

## 2023-08-16 DIAGNOSIS — R519 Headache, unspecified: Secondary | ICD-10-CM | POA: Diagnosis not present

## 2023-08-16 LAB — HM DIABETES EYE EXAM

## 2023-08-20 ENCOUNTER — Emergency Department (HOSPITAL_BASED_OUTPATIENT_CLINIC_OR_DEPARTMENT_OTHER)

## 2023-08-20 ENCOUNTER — Other Ambulatory Visit: Payer: Self-pay

## 2023-08-20 ENCOUNTER — Emergency Department (HOSPITAL_BASED_OUTPATIENT_CLINIC_OR_DEPARTMENT_OTHER)
Admission: EM | Admit: 2023-08-20 | Discharge: 2023-08-20 | Disposition: A | Discharge status: Home / Self Care | Attending: Emergency Medicine | Admitting: Emergency Medicine

## 2023-08-20 ENCOUNTER — Encounter (HOSPITAL_BASED_OUTPATIENT_CLINIC_OR_DEPARTMENT_OTHER): Payer: Self-pay | Admitting: Emergency Medicine

## 2023-08-20 DIAGNOSIS — E119 Type 2 diabetes mellitus without complications: Secondary | ICD-10-CM | POA: Insufficient documentation

## 2023-08-20 DIAGNOSIS — G4489 Other headache syndrome: Secondary | ICD-10-CM | POA: Insufficient documentation

## 2023-08-20 DIAGNOSIS — R519 Headache, unspecified: Secondary | ICD-10-CM

## 2023-08-20 DIAGNOSIS — J45909 Unspecified asthma, uncomplicated: Secondary | ICD-10-CM | POA: Insufficient documentation

## 2023-08-20 DIAGNOSIS — Z7951 Long term (current) use of inhaled steroids: Secondary | ICD-10-CM | POA: Insufficient documentation

## 2023-08-20 DIAGNOSIS — R9082 White matter disease, unspecified: Secondary | ICD-10-CM | POA: Diagnosis not present

## 2023-08-20 LAB — BASIC METABOLIC PANEL WITH GFR
Anion gap: 10 (ref 5–15)
BUN: 16 mg/dL (ref 8–23)
CO2: 26 mmol/L (ref 22–32)
Calcium: 9.9 mg/dL (ref 8.9–10.3)
Chloride: 103 mmol/L (ref 98–111)
Creatinine, Ser: 0.89 mg/dL (ref 0.44–1.00)
GFR, Estimated: >60 mL/min (ref >60)
Glucose, Bld: 118 mg/dL — ABNORMAL HIGH (ref 70–99)
Potassium: 4.5 mmol/L (ref 3.5–5.1)
Sodium: 139 mmol/L (ref 135–145)

## 2023-08-20 LAB — CBC
HCT: 38.7 % (ref 36.0–46.0)
Hemoglobin: 12.5 g/dL (ref 12.0–15.0)
MCH: 27.8 pg (ref 26.0–34.0)
MCHC: 32.3 g/dL (ref 30.0–36.0)
MCV: 86 fL (ref 80.0–100.0)
Platelets: 297 10*3/uL (ref 150–400)
RBC: 4.5 MIL/uL (ref 3.87–5.11)
RDW: 14.9 % (ref 11.5–15.5)
WBC: 5.3 10*3/uL (ref 4.0–10.5)
nRBC: 0 % (ref 0.0–0.2)

## 2023-08-20 MED ORDER — DIPHENHYDRAMINE HCL 50 MG/ML IJ SOLN
12.5000 mg | Freq: Once | INTRAMUSCULAR | Status: AC
  Administered 2023-08-20: 12.5 mg via INTRAVENOUS
  Filled 2023-08-20: qty 1

## 2023-08-20 MED ORDER — KETOROLAC TROMETHAMINE 30 MG/ML IJ SOLN
15.0000 mg | Freq: Once | INTRAMUSCULAR | Status: AC
  Administered 2023-08-20: 15 mg via INTRAVENOUS
  Filled 2023-08-20: qty 1

## 2023-08-20 MED ORDER — PROCHLORPERAZINE EDISYLATE 10 MG/2ML IJ SOLN
10.0000 mg | Freq: Once | INTRAMUSCULAR | Status: AC
  Administered 2023-08-20: 10 mg via INTRAVENOUS
  Filled 2023-08-20: qty 2

## 2023-08-20 NOTE — Discharge Instructions (Signed)
 Follow-up with your neurologist for continued evaluation of your recurrent headaches.  Return to the emergency room for numbness weakness or other concerning symptoms

## 2023-08-20 NOTE — ED Triage Notes (Signed)
 C/o intermittent headaches x 1 month. Seen by PCP and neurologist. Reports pain worse today. No deficits.

## 2023-08-20 NOTE — ED Provider Notes (Signed)
  EMERGENCY DEPARTMENT AT [facility] Provider Note   CSN: [id number] Arrival date & time: 08/20/23  1057     History  Chief Complaint  Patient presents with   Headache    Grace Gregory is a 63 y.o. female.   Headache    Patient has a history of acid reflux hypertension arthritis asthma diabetes hypercholesterolemia.  She presents ED for evaluation of a persistent headache.  Patient states she has been having headaches intermittently for the last month.  Patient was seen by her primary care doctor as well as a neurologist.  Patient had an MRI ordered but it was declined initially by her insurance.  Patient is still waiting to have that completed.  She did see a neurologist on the 26.  Patient was instructed to reduce her daily Excedrin Migraine use.  She was given a prescription for Ubrelvy.  Patient presents to the ED today because of her headache is persisting.  She is not have any fevers or chills.  No vomiting.  No focal numbness or weakness  Home Medications Prior to Admission medications   Medication Sig Start Date End Date Taking? Authorizing Provider  albuterol (PROAIR HFA) 108 (90 Base) MCG/ACT inhaler Inhale 2 puffs by mouth as needed every  4-6 hours as needed for cough/wheeze 06/30/22   Mliss Sax, MD  AMBULATORY NON FORMULARY MEDICATION Medication Name: Allergy shot x2 a week    [provider]  atorvastatin (LIPITOR) 40 MG tablet Take 1 tablet (40 mg total) by mouth daily. 08/02/22   Mliss Sax, MD  AUVI-Q 0.3 MG/0.3ML SOAJ injection  08/06/19   [provider]  azelastine (ASTELIN) 0.1 % nasal spray Place 1-2 puffs in each nostril Nasally Twice a day 12/13/21     beclomethasone (QVAR) 80 MCG/ACT inhaler Inhale 2 puffs into the lungs in the morning and at bedtime. 06/30/22   Mliss Sax, MD  Blood Glucose Monitoring Suppl (FREESTYLE LITE) DEVI Use to test blood sugars 1-2 times daily. 01/15/20    Mliss Sax, MD  cetirizine (ZYRTEC ALLERGY) 10 MG tablet Take 1 tablet by mouth once a day 12/13/21     cetirizine (ZYRTEC ALLERGY) 10 MG tablet Take 1 tablet (10 mg total) by mouth daily. 12/21/22     clobetasol cream (TEMOVATE) 0.05 % Apply 1 application topically 2 (two) times daily. Not for face or private areas. 01/27/20   Mliss Sax, MD  estradiol (VIVELLE-DOT) 0.075 MG/24HR Place 1 patch onto the skin 2 (two) times a week. 07/16/23   Tereso Newcomer, MD  gabapentin (NEURONTIN) 300 MG capsule TAKE ONE CAPSULE BY MOUTH TWICE A DAY 04/17/23   Mliss Sax, MD  glucose blood (FREESTYLE LITE) test strip Use to test blood sugars 1-2 times daily. 01/29/19   Mliss Sax, MD  hydrochlorothiazide (HYDRODIURIL) 12.5 MG tablet Take 1 tablet (12.5 mg total) by mouth daily. 06/21/23   Mliss Sax, MD  Lancets (FREESTYLE) lancets Use to test blood sugars 1-2 times daily. 01/29/19   Mliss Sax, MD  losartan (COZAAR) 50 MG tablet TAKE ONE TABLET BY MOUTH EVERY DAY 06/05/23   Mliss Sax, MD  meclizine (ANTIVERT) 25 MG tablet TAKE ONE TABLET BY MOUTH THREE TIMES A DAY AS NEEDED FOR DIZZINESS 08/09/22   Mliss Sax, MD  metFORMIN (GLUCOPHAGE-XR) 500 MG 24 hr tablet Take 2 tablets (1,000 mg total) by mouth 2 (two) times daily. 06/30/22   Nadene Rubins  Gaspar Garbe, MD  metoprolol succinate (TOPROL-XL) 25 MG 24 hr tablet TAKE ONE TABLET BY MOUTH EVERY DAY 03/16/23   Mliss Sax, MD  montelukast (SINGULAIR) 10 MG tablet Take 1 tablet (10 mg total) by mouth daily. 06/30/22   Mliss Sax, MD  montelukast (SINGULAIR) 10 MG tablet Take 1 tablet (10 mg total) by mouth daily. 12/21/22     pantoprazole (PROTONIX) 40 MG tablet TAKE 2 TABLETS BY MOUTH DAILY 09/25/22   Mliss Sax, MD  tiZANidine (ZANAFLEX) 2 MG tablet Take 4 mg by mouth at bedtime as needed. 07/30/23   [provider]  triamcinolone ointment  (KENALOG) 0.1 % Apply externally twice daily. 12/21/22     TRULICITY 1.5 MG/0.5ML SOAJ SMARTSIG:1 pre-filled pen syringe SUB-Q Once a Week 06/05/23   [provider]      Allergies    Lisinopril, Other, Propoxyphene, and Penicillins    Review of Systems   Review of Systems  Neurological:  Positive for headaches.    Physical Exam Updated Vital Signs BP 122/72   Pulse 63   Temp 97.7 F (36.5 C)   Resp 17   Ht 1.575 m (5\' 2" )   Wt 88.9 kg   LMP  (LMP Unknown)   SpO2 97%   BMI 35.85 kg/m  Physical Exam Vitals and nursing note reviewed.  Constitutional:      General: She is not in acute distress.    Appearance: She is well-developed.  HENT:     Head: Normocephalic and atraumatic.     Right Ear: External ear normal.     Left Ear: External ear normal.  Eyes:     General: No visual field deficit or scleral icterus.       Right eye: No discharge.        Left eye: No discharge.     Conjunctiva/sclera: Conjunctivae normal.  Neck:     Trachea: No tracheal deviation.  Cardiovascular:     Rate and Rhythm: Normal rate and regular rhythm.  Pulmonary:     Effort: Pulmonary effort is normal. No respiratory distress.     Breath sounds: Normal breath sounds. No stridor. No wheezing or rales.  Abdominal:     General: Bowel sounds are normal. There is no distension.     Palpations: Abdomen is soft.     Tenderness: There is no abdominal tenderness. There is no guarding or rebound.  Musculoskeletal:        General: No tenderness.     Cervical back: Full passive range of motion without pain and neck supple. No rigidity.  Skin:    General: Skin is warm and dry.     Findings: No rash.  Neurological:     Mental Status: She is alert and oriented to person, place, and time.     Cranial Nerves: No cranial nerve deficit, dysarthria or facial asymmetry.     Sensory: No sensory deficit.     Motor: No abnormal muscle tone, seizure activity or pronator drift.     Coordination:  Coordination normal.     Comments:  able to hold both legs off bed for 5 seconds, sensation intact in all extremities,  no left or right sided neglect, normal finger-nose exam bilaterally, no nystagmus noted   Psychiatric:        Mood and Affect: Mood normal.     ED Results / Procedures / Treatments   Labs (all labs ordered are listed, but only abnormal results are displayed) Labs Reviewed  BASIC METABOLIC PANEL WITH GFR - Abnormal; Notable for the following components:      Result Value   Glucose, Bld 118 (*)    All other components within normal limits  CBC    EKG None  Radiology CT Head Wo Contrast Result Date: 08/20/2023 CLINICAL DATA:  63 year old female with persistent, intermittent headaches. EXAM: CT HEAD WITHOUT CONTRAST TECHNIQUE: Contiguous axial images were obtained from the base of the skull through the vertex without intravenous contrast. RADIATION DOSE REDUCTION: This exam was performed according to the departmental dose-optimization program which includes automated exposure control, adjustment of the mA and/or kV according to patient size and/or use of iterative reconstruction technique. COMPARISON:  Head CT, CTA 03/07/2019. FINDINGS: Brain: Some cerebral volume loss since 2020 appears to be generalized. No midline shift, mass effect, or evidence of intracranial mass lesion. No ventriculomegaly. Chronic but progressed widespread Patchy and confluent bilateral cerebral white matter hypodensity which is predominantly central and subcortical, in a nonspecific configuration. No cortical encephalomalacia is identified. Deep gray matter nuclei, brainstem and cerebellum appear negative. Vascular: Calcified atherosclerosis at the skull base. No suspicious intracranial vascular hyperdensity. Skull: Stable and negative. Sinuses/Orbits: Visualized paranasal sinuses and mastoids are clear. Other: Visualized orbits and scalp soft tissues are within normal limits. IMPRESSION: 1. No acute  intracranial abnormality. 2. 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 Electronically Signed   By: Odessa Fleming M.D.   On: 08/20/2023 13:59    Procedures Procedures    Medications Ordered in ED Medications  prochlorperazine (COMPAZINE) injection 10 mg (10 mg Intravenous Given 08/20/23 1314)  ketorolac (TORADOL) 30 MG/ML injection 15 mg (15 mg Intravenous Given 08/20/23 1304)  diphenhydrAMINE (BENADRYL) injection 12.5 mg (12.5 mg Intravenous Given 08/20/23 1312)    ED Course/ Medical Decision Making/ A&P Clinical Course as of 08/20/23 1437  Mon Aug 20, 2023  1409 CBC CBC and metabolic panel normal. [JK]  1410 CT scan without acute abnormality.  Patient does have chronically advanced and progressed white matter disease which is nonspecific [JK]    Clinical Course User Index [JK] Linwood Dibbles, MD                                 Medical Decision Making Problems Addressed: Headache disorder: chronic illness or injury with exacerbation, progression, or side effects of treatment  Amount and/or Complexity of Data Reviewed Labs: ordered. Decision-making details documented in ED Course. Radiology: ordered and independent interpretation performed.  Risk Prescription drug management.   Patient presented to the ED for evaluation of recurrent headaches.  She has been seeing neurology.  Patient is scheduled for an outpatient MRI.  Patient had recurrent headache today.  She does not have any focal neurologic deficits on exam.  Patient does have a component of photophobia suggesting of migraine headache.  Patient was treated with migraine cocktail with resolution of her symptoms.  Considering her age and symptoms CT scan of the head was performed.  Fortunately no signs of any acute abnormality.  Chronic microvascular changes noted.  Patient does have plans for outpatient MRI.  MRI is not available at  this institution.  I do not think she requires emergent transfer and imaging today.  I have low suspicion for stroke TIA.  Findings not suggestive of meningitis.  Patient is feeling much better.  She is ready for discharge.  Evaluation and diagnostic testing in the emergency department does not suggest an emergent condition requiring admission or immediate intervention beyond what has been performed at this time.  The patient is safe for discharge and has been instructed to return immediately for worsening symptoms, change in symptoms or any other concerns.        Final Clinical Impression(s) / ED Diagnoses Final diagnoses:  Headache disorder    Rx / DC Orders ED Discharge Orders     None         Linwood Dibbles, MD 08/20/23 1437

## 2023-08-22 ENCOUNTER — Encounter: Payer: Self-pay | Admitting: Neurology

## 2023-08-22 ENCOUNTER — Telehealth: Payer: Self-pay

## 2023-08-22 NOTE — Transitions of Care (Post Inpatient/ED Visit) (Signed)
 08/22/2023  Name: Grace Gregory MRN: [id number] DOB: [birth year]  Today's TOC FU Call Status: Today's TOC FU Call Status:: Successful TOC FU Call Completed TOC FU Call Complete Date: 08/21/23 Patient's Name and Date of Birth confirmed.  Transition Care Management Follow-up Telephone Call Date of Discharge: 08/20/23 Discharge Facility: [facility] Type of Discharge: Emergency Department Reason for ED Visit: Other: (headaches) How have you been since you were released from the hospital?: Better Any questions or concerns?: No  Items Reviewed: Did you receive and understand the discharge instructions provided?: Yes Medications obtained,verified, and reconciled?: Yes (Medications Reviewed) Any new allergies since your discharge?: No Dietary orders reviewed?: NA Do you have support at home?: No  Medications Reviewed Today: Medications Reviewed Today     Reviewed by Leroy Kennedy, CMA (Certified Medical Assistant) on 08/22/23 at [phone]  Med List Status: <None>   Medication Order Taking? Sig Documenting Provider Last Dose Status Informant  albuterol (PROAIR HFA) 108 (90 Base) MCG/ACT inhaler [id number] Yes Inhale 2 puffs by mouth as needed every  4-6 hours as needed for cough/wheeze Mliss Sax, MD Taking Active   AMBULATORY NON [facility] MEDICATION [id number] Yes Medication Name: Allergy shot x2 a week [provider] Taking Active            Med Note Jenne Pane, TEQUILA   Fri Jun 30, 2022 10:58 AM) Once a week   atorvastatin (LIPITOR) 40 MG tablet [id number] Yes Take 1 tablet (40 mg total) by mouth daily. Mliss Sax, MD Taking Active   AUVI-Q 0.3 MG/0.3ML SOAJ injection [id number] Yes  [provider] Taking Active   azelastine (ASTELIN) 0.1 % nasal spray [id number] Yes Place 1-2 puffs in each nostril Nasally Twice a day  Taking Active   beclomethasone (QVAR) 80 MCG/ACT inhaler [id number] Yes Inhale 2 puffs into the lungs in the morning and  at bedtime. Mliss Sax, MD Taking Active   Blood Glucose Monitoring Suppl (FREESTYLE LITE) DEVI [id number] Yes Use to test blood sugars 1-2 times daily. Mliss Sax, MD Taking Active   cetirizine [facility] ALLERGY) 10 MG tablet [id number] Yes Take 1 tablet by mouth once a day  Taking Active   cetirizine (ZYRTEC ALLERGY) 10 MG tablet [id number] Yes Take 1 tablet (10 mg total) by mouth daily.  Taking Active   clobetasol cream (TEMOVATE) 0.05 % [id number] Yes Apply 1 application topically 2 (two) times daily. Not for face or private areas. Mliss Sax, MD Taking Active   estradiol (VIVELLE-DOT) 0.075 Annette Stable [id number] Yes Place 1 patch onto the skin 2 (two) times a week. Tereso Newcomer, MD Taking Active   gabapentin (NEURONTIN) 300 MG capsule [id number] Yes TAKE ONE CAPSULE BY MOUTH TWICE A DAY Mliss Sax, MD Taking Active   glucose blood (FREESTYLE LITE) test strip [id number] Yes Use to test blood sugars 1-2 times daily. Mliss Sax, MD Taking Active   hydrochlorothiazide (HYDRODIURIL) 12.5 MG tablet [id number] Yes Take 1 tablet (12.5 mg total) by mouth daily. Mliss Sax, MD Taking Active   Lancets (FREESTYLE) lancets [id number] Yes Use to test blood sugars 1-2 times daily. Mliss Sax, MD Taking Active   losartan (COZAAR) 50 MG tablet [id number] Yes TAKE ONE TABLET BY MOUTH EVERY DAY Mliss Sax, MD Taking Active   meclizine (ANTIVERT) 25 MG tablet [id number] Yes TAKE ONE TABLET BY MOUTH THREE TIMES A DAY AS NEEDED FOR DIZZINESS Mliss Sax, MD Taking Active   metFORMIN (GLUCOPHAGE-XR) 500  MG 24 hr tablet [id number] Yes Take 2 tablets (1,000 mg total) by mouth 2 (two) times daily. Mliss Sax, MD Taking Active   metoprolol succinate (TOPROL-XL) 25 MG 24 hr tablet [id number] Yes TAKE ONE TABLET BY MOUTH EVERY DAY Mliss Sax, MD Taking Active   montelukast (SINGULAIR) 10 MG tablet [id number]  Yes Take 1 tablet (10 mg total) by mouth daily. Mliss Sax, MD Taking Active   montelukast (SINGULAIR) 10 MG tablet [id number]  Take 1 tablet (10 mg total) by mouth daily.   Active   pantoprazole (PROTONIX) 40 MG tablet [id number] Yes TAKE 2 TABLETS BY MOUTH DAILY Mliss Sax, MD Taking Active   tiZANidine (ZANAFLEX) 2 MG tablet [id number] Yes Take 4 mg by mouth at bedtime as needed. [provider] Taking Active   triamcinolone ointment (KENALOG) 0.1 % [id number] Yes Apply externally twice daily.  Taking Active   TRULICITY 1.5 MG/0.5ML Ivory Broad [id number] Yes SMARTSIG:1 pre-filled pen syringe SUB-Q Once a Week [provider] Taking Active             Home Care and Equipment/Supplies: Were Home Health Services Ordered?: NA Any new equipment or medical supplies ordered?: NA  Functional Questionnaire: Do you need assistance with bathing/showering or dressing?: No Do you need assistance with meal preparation?: No Do you need assistance with eating?: No Do you have difficulty maintaining continence: No Do you need assistance with getting out of bed/getting out of a chair/moving?: No Do you have difficulty managing or taking your medications?: No  Follow up appointments reviewed: PCP Follow-up appointment confirmed?: NA (Pt declined at this time (transfer of care)) Specialist Hospital Follow-up appointment confirmed?: NA Do you need transportation to your follow-up appointment?: No Do you understand care options if your condition(s) worsen?: Yes-patient verbalized understanding    SIGNATURE Jodelle Green, RMA

## 2023-08-27 NOTE — Telephone Encounter (Signed)
 The patient called and she said talked to her insurance and they are requiring a PA for the MRI. I called her insurance and had to send in notes for them to review. [employer] case #[id number] fax # [phone] I let her know that since she already has one pending a peer to peer from another office they may not approve this one and that office would be responsible for the peer to peer and it wouldn't be able to be scheduled here.

## 2023-08-27 NOTE — Telephone Encounter (Signed)
 Noted, thank you. CTH report reviewed.

## 2023-08-27 NOTE — Telephone Encounter (Signed)
 I spoke to the patient on the phone because we are having issues with her insurance getting the MRI approved, but she wanted me to send Dr. Frances Furbish a message to let her know that she was in the ED on March 31 for her headaches and got a CT scan but she is still getting painful migraines.

## 2023-08-29 ENCOUNTER — Other Ambulatory Visit

## 2023-08-30 DIAGNOSIS — J3081 Allergic rhinitis due to animal (cat) (dog) hair and dander: Secondary | ICD-10-CM | POA: Diagnosis not present

## 2023-08-30 DIAGNOSIS — J3089 Other allergic rhinitis: Secondary | ICD-10-CM | POA: Diagnosis not present

## 2023-08-30 DIAGNOSIS — J301 Allergic rhinitis due to pollen: Secondary | ICD-10-CM | POA: Diagnosis not present

## 2023-09-02 ENCOUNTER — Other Ambulatory Visit: Payer: Self-pay | Admitting: Family Medicine

## 2023-09-09 ENCOUNTER — Other Ambulatory Visit: Payer: Self-pay | Admitting: Family Medicine

## 2023-09-11 ENCOUNTER — Ambulatory Visit

## 2023-09-11 DIAGNOSIS — G4489 Other headache syndrome: Secondary | ICD-10-CM

## 2023-09-11 DIAGNOSIS — G4486 Cervicogenic headache: Secondary | ICD-10-CM | POA: Diagnosis not present

## 2023-09-11 DIAGNOSIS — G43909 Migraine, unspecified, not intractable, without status migrainosus: Secondary | ICD-10-CM

## 2023-09-11 DIAGNOSIS — G4733 Obstructive sleep apnea (adult) (pediatric): Secondary | ICD-10-CM

## 2023-09-11 DIAGNOSIS — H5712 Ocular pain, left eye: Secondary | ICD-10-CM | POA: Diagnosis not present

## 2023-09-11 DIAGNOSIS — G44209 Tension-type headache, unspecified, not intractable: Secondary | ICD-10-CM

## 2023-09-11 DIAGNOSIS — Z8489 Family history of other specified conditions: Secondary | ICD-10-CM

## 2023-09-11 DIAGNOSIS — R519 Headache, unspecified: Secondary | ICD-10-CM

## 2023-09-11 DIAGNOSIS — Z8249 Family history of ischemic heart disease and other diseases of the circulatory system: Secondary | ICD-10-CM

## 2023-09-11 MED ORDER — GADOBENATE DIMEGLUMINE 529 MG/ML IV SOLN
20.0000 mL | Freq: Once | INTRAVENOUS | Status: AC | PRN
Start: 2023-09-11 — End: 2023-09-11
  Administered 2023-09-11: 20 mL via INTRAVENOUS

## 2023-09-17 ENCOUNTER — Telehealth: Payer: Self-pay | Admitting: *Deleted

## 2023-09-17 NOTE — Telephone Encounter (Signed)
-----   Message from Debbra Fairy sent at 09/11/2023  4:22 PM EDT ----- Please call patient regarding the recent brain MRI: The brain scan showed a normal structure of the brain and overall no age-advanced volume loss, but prominent changes in the deeper structures of the brain, which we call white matter changes or microvascular changes. These were reported as moderate and age-advanced in her case. These are changes that occur with time and are seen in a variety of conditions, including with normal aging, chronic hypertension, chronic headaches, especially migraine HAs, chronic diabetes, chronic hyperlipidemia. These are not strokes and no mass or lesion and normal contrast enhancement was seen which is reassuring. Again, there were no acute findings, such as a stroke, or mass or blood products. No further action is required on this test at this time, other than re-enforcing the importance of good blood pressure control, good cholesterol control, good blood sugar control, sleep apnea and weight management through her PCP, sleep specialist and other providers.  Of note similar changes were seen on a head CT in 2020 and also again on a head CT in March 2025. She can FU as scheduled in Sept.   Debbra Fairy, MD, PhD

## 2023-09-17 NOTE — Telephone Encounter (Signed)
 Spoke to pt and relayed results of MRI to her via Dr. Dail Drought result note.  She appreciated call back and verbalized understanding.

## 2023-09-18 ENCOUNTER — Other Ambulatory Visit: Payer: Self-pay | Admitting: Family Medicine

## 2023-09-19 NOTE — Telephone Encounter (Signed)
 Please review refill request

## 2023-09-20 ENCOUNTER — Other Ambulatory Visit: Payer: Self-pay | Admitting: Family Medicine

## 2023-09-20 DIAGNOSIS — J3081 Allergic rhinitis due to animal (cat) (dog) hair and dander: Secondary | ICD-10-CM | POA: Diagnosis not present

## 2023-09-20 DIAGNOSIS — J3089 Other allergic rhinitis: Secondary | ICD-10-CM | POA: Diagnosis not present

## 2023-09-20 DIAGNOSIS — E1165 Type 2 diabetes mellitus with hyperglycemia: Secondary | ICD-10-CM

## 2023-09-20 DIAGNOSIS — J301 Allergic rhinitis due to pollen: Secondary | ICD-10-CM | POA: Diagnosis not present

## 2023-09-25 ENCOUNTER — Other Ambulatory Visit: Payer: Self-pay | Admitting: Family Medicine

## 2023-09-26 ENCOUNTER — Other Ambulatory Visit: Payer: Self-pay | Admitting: Family Medicine

## 2023-10-02 DIAGNOSIS — Z79899 Other long term (current) drug therapy: Secondary | ICD-10-CM | POA: Diagnosis not present

## 2023-10-02 DIAGNOSIS — G43719 Chronic migraine without aura, intractable, without status migrainosus: Secondary | ICD-10-CM | POA: Diagnosis not present

## 2023-10-02 DIAGNOSIS — Z049 Encounter for examination and observation for unspecified reason: Secondary | ICD-10-CM | POA: Diagnosis not present

## 2023-10-03 DIAGNOSIS — J3081 Allergic rhinitis due to animal (cat) (dog) hair and dander: Secondary | ICD-10-CM | POA: Diagnosis not present

## 2023-10-03 DIAGNOSIS — L2089 Other atopic dermatitis: Secondary | ICD-10-CM | POA: Diagnosis not present

## 2023-10-03 DIAGNOSIS — J3089 Other allergic rhinitis: Secondary | ICD-10-CM | POA: Diagnosis not present

## 2023-10-03 DIAGNOSIS — J301 Allergic rhinitis due to pollen: Secondary | ICD-10-CM | POA: Diagnosis not present

## 2023-10-03 DIAGNOSIS — J4540 Moderate persistent asthma, uncomplicated: Secondary | ICD-10-CM | POA: Diagnosis not present

## 2023-10-09 DIAGNOSIS — J3081 Allergic rhinitis due to animal (cat) (dog) hair and dander: Secondary | ICD-10-CM | POA: Diagnosis not present

## 2023-10-09 DIAGNOSIS — J301 Allergic rhinitis due to pollen: Secondary | ICD-10-CM | POA: Diagnosis not present

## 2023-10-10 DIAGNOSIS — J3089 Other allergic rhinitis: Secondary | ICD-10-CM | POA: Diagnosis not present

## 2023-10-11 DIAGNOSIS — J3081 Allergic rhinitis due to animal (cat) (dog) hair and dander: Secondary | ICD-10-CM | POA: Diagnosis not present

## 2023-10-11 DIAGNOSIS — J3089 Other allergic rhinitis: Secondary | ICD-10-CM | POA: Diagnosis not present

## 2023-10-11 DIAGNOSIS — J301 Allergic rhinitis due to pollen: Secondary | ICD-10-CM | POA: Diagnosis not present

## 2023-10-18 DIAGNOSIS — J3089 Other allergic rhinitis: Secondary | ICD-10-CM | POA: Diagnosis not present

## 2023-10-18 DIAGNOSIS — J301 Allergic rhinitis due to pollen: Secondary | ICD-10-CM | POA: Diagnosis not present

## 2023-10-18 DIAGNOSIS — J3081 Allergic rhinitis due to animal (cat) (dog) hair and dander: Secondary | ICD-10-CM | POA: Diagnosis not present

## 2023-10-23 DIAGNOSIS — J301 Allergic rhinitis due to pollen: Secondary | ICD-10-CM | POA: Diagnosis not present

## 2023-10-23 DIAGNOSIS — J3081 Allergic rhinitis due to animal (cat) (dog) hair and dander: Secondary | ICD-10-CM | POA: Diagnosis not present

## 2023-10-23 DIAGNOSIS — J3089 Other allergic rhinitis: Secondary | ICD-10-CM | POA: Diagnosis not present

## 2023-10-30 DIAGNOSIS — E119 Type 2 diabetes mellitus without complications: Secondary | ICD-10-CM | POA: Diagnosis not present

## 2023-10-30 DIAGNOSIS — I10 Essential (primary) hypertension: Secondary | ICD-10-CM | POA: Diagnosis not present

## 2023-10-30 DIAGNOSIS — E785 Hyperlipidemia, unspecified: Secondary | ICD-10-CM | POA: Diagnosis not present

## 2023-11-01 DIAGNOSIS — J3081 Allergic rhinitis due to animal (cat) (dog) hair and dander: Secondary | ICD-10-CM | POA: Diagnosis not present

## 2023-11-01 DIAGNOSIS — J301 Allergic rhinitis due to pollen: Secondary | ICD-10-CM | POA: Diagnosis not present

## 2023-11-01 DIAGNOSIS — J3089 Other allergic rhinitis: Secondary | ICD-10-CM | POA: Diagnosis not present

## 2023-11-06 DIAGNOSIS — J3089 Other allergic rhinitis: Secondary | ICD-10-CM | POA: Diagnosis not present

## 2023-11-06 DIAGNOSIS — J301 Allergic rhinitis due to pollen: Secondary | ICD-10-CM | POA: Diagnosis not present

## 2023-11-06 DIAGNOSIS — J3081 Allergic rhinitis due to animal (cat) (dog) hair and dander: Secondary | ICD-10-CM | POA: Diagnosis not present

## 2023-11-08 ENCOUNTER — Other Ambulatory Visit: Payer: Self-pay | Admitting: Family Medicine

## 2023-11-15 DIAGNOSIS — J3089 Other allergic rhinitis: Secondary | ICD-10-CM | POA: Diagnosis not present

## 2023-11-15 DIAGNOSIS — J301 Allergic rhinitis due to pollen: Secondary | ICD-10-CM | POA: Diagnosis not present

## 2023-11-15 DIAGNOSIS — J3081 Allergic rhinitis due to animal (cat) (dog) hair and dander: Secondary | ICD-10-CM | POA: Diagnosis not present

## 2023-11-21 DIAGNOSIS — J3089 Other allergic rhinitis: Secondary | ICD-10-CM | POA: Diagnosis not present

## 2023-11-21 DIAGNOSIS — J301 Allergic rhinitis due to pollen: Secondary | ICD-10-CM | POA: Diagnosis not present

## 2023-11-21 DIAGNOSIS — J3081 Allergic rhinitis due to animal (cat) (dog) hair and dander: Secondary | ICD-10-CM | POA: Diagnosis not present

## 2023-11-26 ENCOUNTER — Ambulatory Visit: Admitting: Neurology

## 2023-11-28 DIAGNOSIS — J3089 Other allergic rhinitis: Secondary | ICD-10-CM | POA: Diagnosis not present

## 2023-11-28 DIAGNOSIS — J301 Allergic rhinitis due to pollen: Secondary | ICD-10-CM | POA: Diagnosis not present

## 2023-11-28 DIAGNOSIS — J3081 Allergic rhinitis due to animal (cat) (dog) hair and dander: Secondary | ICD-10-CM | POA: Diagnosis not present

## 2023-12-05 DIAGNOSIS — J3081 Allergic rhinitis due to animal (cat) (dog) hair and dander: Secondary | ICD-10-CM | POA: Diagnosis not present

## 2023-12-05 DIAGNOSIS — J301 Allergic rhinitis due to pollen: Secondary | ICD-10-CM | POA: Diagnosis not present

## 2023-12-05 DIAGNOSIS — J3089 Other allergic rhinitis: Secondary | ICD-10-CM | POA: Diagnosis not present

## 2023-12-11 DIAGNOSIS — J3081 Allergic rhinitis due to animal (cat) (dog) hair and dander: Secondary | ICD-10-CM | POA: Diagnosis not present

## 2023-12-11 DIAGNOSIS — J3089 Other allergic rhinitis: Secondary | ICD-10-CM | POA: Diagnosis not present

## 2023-12-11 DIAGNOSIS — J301 Allergic rhinitis due to pollen: Secondary | ICD-10-CM | POA: Diagnosis not present

## 2023-12-13 ENCOUNTER — Telehealth: Payer: Self-pay | Admitting: Neurology

## 2023-12-13 NOTE — Telephone Encounter (Signed)
 FYI only:  I reviewed the patient's ophthalmology records, she saw [facility], [place] on 08/16/2023.  Impression/plan: Common migraine, not intractable, recommend MRI.  Eye pain OS.  No ocular etiology for headache noted.  No infection, no drainage, no injection.  Headache, no ocular etiology for headache noted.  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  Moderate keratoconjunctivitis sicca OU.  Ivizia prn. Anterior blepharitis all 4 lids.  Continue lid hygiene with baby shampoo, warm compress and eyelid wash.  Hypertensive retinopathy OU.  Resolved subconjunctival hemorrhage OS.  Follow-up as needed.  I will have the full report scanned in.

## 2023-12-20 DIAGNOSIS — J301 Allergic rhinitis due to pollen: Secondary | ICD-10-CM | POA: Diagnosis not present

## 2023-12-20 DIAGNOSIS — J3089 Other allergic rhinitis: Secondary | ICD-10-CM | POA: Diagnosis not present

## 2023-12-20 DIAGNOSIS — J3081 Allergic rhinitis due to animal (cat) (dog) hair and dander: Secondary | ICD-10-CM | POA: Diagnosis not present

## 2023-12-27 DIAGNOSIS — J3089 Other allergic rhinitis: Secondary | ICD-10-CM | POA: Diagnosis not present

## 2023-12-27 DIAGNOSIS — J301 Allergic rhinitis due to pollen: Secondary | ICD-10-CM | POA: Diagnosis not present

## 2023-12-27 DIAGNOSIS — J3081 Allergic rhinitis due to animal (cat) (dog) hair and dander: Secondary | ICD-10-CM | POA: Diagnosis not present

## 2024-01-03 DIAGNOSIS — J3081 Allergic rhinitis due to animal (cat) (dog) hair and dander: Secondary | ICD-10-CM | POA: Diagnosis not present

## 2024-01-03 DIAGNOSIS — J3089 Other allergic rhinitis: Secondary | ICD-10-CM | POA: Diagnosis not present

## 2024-01-03 DIAGNOSIS — J301 Allergic rhinitis due to pollen: Secondary | ICD-10-CM | POA: Diagnosis not present

## 2024-01-11 ENCOUNTER — Encounter: Payer: Self-pay | Admitting: Neurology

## 2024-01-11 DIAGNOSIS — J3089 Other allergic rhinitis: Secondary | ICD-10-CM | POA: Diagnosis not present

## 2024-01-11 DIAGNOSIS — J3081 Allergic rhinitis due to animal (cat) (dog) hair and dander: Secondary | ICD-10-CM | POA: Diagnosis not present

## 2024-01-11 DIAGNOSIS — J301 Allergic rhinitis due to pollen: Secondary | ICD-10-CM | POA: Diagnosis not present

## 2024-01-17 DIAGNOSIS — J301 Allergic rhinitis due to pollen: Secondary | ICD-10-CM | POA: Diagnosis not present

## 2024-01-17 DIAGNOSIS — J3089 Other allergic rhinitis: Secondary | ICD-10-CM | POA: Diagnosis not present

## 2024-01-17 DIAGNOSIS — J3081 Allergic rhinitis due to animal (cat) (dog) hair and dander: Secondary | ICD-10-CM | POA: Diagnosis not present

## 2024-01-25 DIAGNOSIS — J3081 Allergic rhinitis due to animal (cat) (dog) hair and dander: Secondary | ICD-10-CM | POA: Diagnosis not present

## 2024-01-25 DIAGNOSIS — J3089 Other allergic rhinitis: Secondary | ICD-10-CM | POA: Diagnosis not present

## 2024-01-25 DIAGNOSIS — J301 Allergic rhinitis due to pollen: Secondary | ICD-10-CM | POA: Diagnosis not present

## 2024-01-30 ENCOUNTER — Telehealth: Payer: Self-pay

## 2024-01-30 NOTE — Telephone Encounter (Signed)
 Patient called in stating that she has a new pharmacy that she needs her estradiol  sent to. Pharmacy is [facility].   Address: [address] [place], [place] [address] Phone: [phone] Fax: [phone]

## 2024-02-01 DIAGNOSIS — J3089 Other allergic rhinitis: Secondary | ICD-10-CM | POA: Diagnosis not present

## 2024-02-01 DIAGNOSIS — J301 Allergic rhinitis due to pollen: Secondary | ICD-10-CM | POA: Diagnosis not present

## 2024-02-01 DIAGNOSIS — J3081 Allergic rhinitis due to animal (cat) (dog) hair and dander: Secondary | ICD-10-CM | POA: Diagnosis not present

## 2024-02-04 ENCOUNTER — Other Ambulatory Visit: Payer: Self-pay

## 2024-02-04 DIAGNOSIS — E2839 Other primary ovarian failure: Secondary | ICD-10-CM

## 2024-02-04 MED ORDER — ESTRADIOL 0.075 MG/24HR TD PTTW: 1.0000 | MEDICATED_PATCH | TRANSDERMAL | 0 refills | Status: AC

## 2024-02-05 DIAGNOSIS — J3089 Other allergic rhinitis: Secondary | ICD-10-CM | POA: Diagnosis not present

## 2024-02-05 DIAGNOSIS — J3081 Allergic rhinitis due to animal (cat) (dog) hair and dander: Secondary | ICD-10-CM | POA: Diagnosis not present

## 2024-02-05 DIAGNOSIS — J301 Allergic rhinitis due to pollen: Secondary | ICD-10-CM | POA: Diagnosis not present

## 2024-02-14 DIAGNOSIS — J3089 Other allergic rhinitis: Secondary | ICD-10-CM | POA: Diagnosis not present

## 2024-02-14 DIAGNOSIS — J3081 Allergic rhinitis due to animal (cat) (dog) hair and dander: Secondary | ICD-10-CM | POA: Diagnosis not present

## 2024-02-14 DIAGNOSIS — J301 Allergic rhinitis due to pollen: Secondary | ICD-10-CM | POA: Diagnosis not present

## 2024-02-15 ENCOUNTER — Ambulatory Visit (INDEPENDENT_AMBULATORY_CARE_PROVIDER_SITE_OTHER): Admitting: Obstetrics and Gynecology

## 2024-02-15 VITALS — BP 160/80 | HR 60 | Ht 62.0 in | Wt 204.0 lb

## 2024-02-15 DIAGNOSIS — Z01419 Encounter for gynecological examination (general) (routine) without abnormal findings: Secondary | ICD-10-CM | POA: Diagnosis not present

## 2024-02-15 DIAGNOSIS — Z87412 Personal history of vulvar dysplasia: Secondary | ICD-10-CM

## 2024-02-15 DIAGNOSIS — N951 Menopausal and female climacteric states: Secondary | ICD-10-CM

## 2024-02-15 DIAGNOSIS — Z79890 Hormone replacement therapy (postmenopausal): Secondary | ICD-10-CM

## 2024-02-15 DIAGNOSIS — Z1331 Encounter for screening for depression: Secondary | ICD-10-CM

## 2024-02-15 NOTE — Progress Notes (Signed)
 ANNUAL GYNECOLOGY VISIT Chief Complaint  Patient presents with   Gynecologic Exam     Subjective:  Grace Gregory is a 63 y.o. G1P0010 who presents for annual exam.  Doing well, no concerns. Denies vulvar symptoms. 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 Patient has had a hysterectomy. History of abnormal pap: yes, no hx LEEP/cone Last mammogram: 2025 Last colonoscopy: 2021 Last DEXA: never, denies risk factors   The pregnancy intention screening data noted above was reviewed. Potential methods of contraception were discussed. The patient elected to proceed with No data recorded.       02/15/2024    9:02 AM 12/20/2022    2:31 PM 06/30/2022   11:00 AM 01/26/2022   11:05 AM 01/26/2022    9:39 AM  Depression screen PHQ 2/9  Decreased Interest 0 0 0 0 0  Down, Depressed, Hopeless 0 0 0 0 0  PHQ - 2 Score 0 0 0 0 0  Altered sleeping 0 0  0   Tired, decreased energy 0 0  0   Change in appetite 0 0  0   Feeling bad or failure about yourself  0 0  0   Trouble concentrating 0 0  0   Moving slowly or fidgety/restless 0 0  0   Suicidal thoughts 0 0  0   PHQ-9 Score 0 0  0         02/15/2024    9:02 AM 12/20/2022    2:31 PM 01/26/2022   11:05 AM  GAD 7 : Generalized Anxiety Score  Nervous, Anxious, on Edge 0 0 0  Control/stop worrying 0 0 0  Worry too much - different things 0 0 0  Trouble relaxing 0 0 0  Restless 0 0 0  Easily annoyed or irritable 0 0 0  Afraid - awful might happen 0 0 0  Total GAD 7 Score 0 0 0      OB History     Gravida  1   Para      Term      Preterm      AB  1   Living         SAB  1   IAB      Ectopic      Multiple      Live Births              Past Medical History:  Diagnosis Date   Arthritis    Asthma    Diabetes (HCC)    Elevated cholesterol     GERD (gastroesophageal reflux disease)    Hypertension    Obesity    Seasonal allergies    Sleep apnea    On CPAP Machine Pulmonary    Past Surgical History:  Procedure Laterality Date   ABDOMINAL HYSTERECTOMY     BACK SURGERY     COLONOSCOPY     age 89 in [place] Normal    VULVECTOMY N/A 08/06/2018   Procedure: WIDE LOCAL EXCISION VULVA;  Surgeon: Eloy Herring, MD;  Location: [facility];  Service: Gynecology;  Laterality: N/A;    Social History   Socioeconomic History   Marital status: Married    Spouse name: Not on file   Number of children: Not on file   Years of education: Not on file  Highest education level: Not on file  Occupational History   Not on file  Tobacco Use   Smoking status: Never   Smokeless tobacco: Never  Vaping Use   Vaping status: Never Used  Substance and Sexual Activity   Alcohol use: Yes    Comment: occassionally    Drug use: Never   Sexual activity: Yes    Birth control/protection: None  Other Topics Concern   Not on file  Social History Narrative   Caffiene 1 cup coffee daily   Lives husband, fish   Work: retired.   Social Drivers of Corporate investment banker Strain: Not on file  Food Insecurity: No Food Insecurity (12/13/2020)   Received [facility]   Hunger Vital Sign    Within the past 12 months, you worried that your food would run out before you got the money to buy more.: Never true    Within the past 12 months, the food you bought just didn't last and you didn't have money to get more.: Never true  Transportation Needs: Not on file  Physical Activity: Not on file  Stress: Not on file  Social Connections: Unknown (10/04/2021)   Received from [facility]   Social Network    Social Network: Not on file    Family History  Problem Relation Age of Onset   Hypertension Mother    Heart disease Father    Hypertension Sister    Hypertension Brother    Hypertension Sister    Hypertension Sister     Brain cancer Brother    Hypertension Brother    Colon cancer Neg Hx    Esophageal cancer Neg Hx    Stomach cancer Neg Hx    Rectal cancer Neg Hx     Current Outpatient Medications on File Prior to Visit  Medication Sig Dispense Refill   albuterol  (PROAIR  HFA) 108 (90 Base) MCG/ACT inhaler Inhale 2 puffs by mouth as needed every  4-6 hours as needed for cough/wheeze 8.5 g 0   AMBULATORY NON FORMULARY MEDICATION Medication Name: Allergy shot x2 a week     atorvastatin  (LIPITOR) 40 MG tablet Take 1 tablet (40 mg total) by mouth daily. 90 tablet 3   AUVI-Q 0.3 MG/0.3ML SOAJ injection      azelastine  (ASTELIN ) 0.1 % nasal spray Place 1-2 puffs in each nostril Nasally Twice a day 30 mL 5   beclomethasone (QVAR ) 80 MCG/ACT inhaler Inhale 2 puffs into the lungs in the morning and at bedtime. 31.8 g 3   Blood Glucose Monitoring Suppl (FREESTYLE LITE) DEVI Use to test blood sugars 1-2 times daily. 1 each 0   cetirizine  (ZYRTEC  ALLERGY) 10 MG tablet Take 1 tablet by mouth once a day 100 tablet 5   clobetasol  cream (TEMOVATE ) 0.05 % Apply 1 application topically 2 (two) times daily. Not for face or private areas. 60 g 0   estradiol  (VIVELLE -DOT) 0.075 MG/24HR Place 1 patch onto the skin 2 (two) times a week. 24 patch 0   gabapentin  (NEURONTIN ) 300 MG capsule TAKE ONE CAPSULE BY MOUTH TWICE A DAY 180 capsule 0   glucose blood (FREESTYLE LITE) test strip Use to test blood sugars 1-2 times daily. 100 each 12   hydrochlorothiazide  (HYDRODIURIL ) 12.5 MG tablet Take 1 tablet (12.5 mg total) by mouth daily. 90 tablet 0   Lancets (FREESTYLE) lancets Use to test blood sugars 1-2 times daily. 100 each 12   losartan  (COZAAR ) 50 MG tablet TAKE ONE TABLET BY MOUTH  EVERY DAY 30 tablet 0   metFORMIN  (GLUCOPHAGE -XR) 500 MG 24 hr tablet Take 2 tablets (1,000 mg total) by mouth 2 (two) times daily. 360 tablet 3   metoprolol  succinate (TOPROL -XL) 25 MG 24 hr tablet TAKE ONE TABLET BY MOUTH EVERY DAY 90 tablet 1    montelukast  (SINGULAIR ) 10 MG tablet Take 1 tablet (10 mg total) by mouth daily. 90 tablet 3   pantoprazole  (PROTONIX ) 40 MG tablet TAKE 2 TABLETS BY MOUTH DAILY 180 tablet 1   triamcinolone  ointment (KENALOG ) 0.1 % Apply externally twice daily. 60 g 3   TRULICITY  1.5 MG/0.5ML SOAJ SMARTSIG:1 pre-filled pen syringe SUB-Q Once a Week     meclizine  (ANTIVERT ) 25 MG tablet TAKE ONE TABLET BY MOUTH THREE TIMES A DAY AS NEEDED FOR DIZZINESS (Patient not taking: Reported on 02/15/2024) 50 tablet 0   montelukast  (SINGULAIR ) 10 MG tablet Take 1 tablet (10 mg total) by mouth daily. 30 tablet 5   tiZANidine (ZANAFLEX) 2 MG tablet Take 4 mg by mouth at bedtime as needed.     No current facility-administered medications on file prior to visit.    Allergies  Allergen Reactions   Lisinopril Cough   Other Rash   Propoxyphene Rash   Penicillins Rash     Objective:   Vitals:   02/15/24 0858 02/15/24 0904  BP: (!) 148/74 (!) 148/63  Pulse: 64 62  Weight: 204 lb 0.6 oz (92.6 kg)   Height: 5' 2 (1.575 m)   Repeat BP 160/80 Patient took her meds today Physical Examination:   General appearance - well appearing, and in no distress  Mental status - alert, oriented to person, place, and time  Breasts - breasts appear normal, no suspicious masses, no skin or nipple changes or  axillary nodes  Abdomen - soft, nontender, nondistended, no masses or organomegaly  Pelvic -  VULVA: normal appearing vulva with no masses, tenderness or lesions   VAGINA: normal appearing vagina with normal color and discharge, no lesions   CERVIX: surgically absent  UTERUS: surgically absent  ADNEXA: No adnexal masses or tenderness noted.  Extremities:  No swelling or varicosities noted  Chaperone present for exam  Assessment and Plan:  1. Encounter for well woman exam with routine gynecological exam (Primary) No pap indicated Up to date on mammo & colonoscopy DEXA age 61  2. History of dysplasia of  vulva Asymptomatic today, no lesions noted, counseled on reporting any new/concerning symptoms  3. Menopausal syndrome on hormone replacement therapy Patient with elevated BP today. We calculated her 10 year cardiovascular risk score and it was 23-28% with her current BPs at today's visit. She states her BPs are usually 125-135 systolic and she took her BP meds this morning. I reviewed her elevated cardiovascular risk score with her and cautioned her on continuing her estrogen therapy. I recommend a trial of MHT. She is willing to try this.  -- f/u in 3 months to reassess -- she has a PCP appt in the meantime and will re-evaluate her risk factors at that time   Rollo ONEIDA Bring, MD, FACOG Obstetrician & Gynecologist, [facility] for [employer], [facility]

## 2024-02-19 ENCOUNTER — Ambulatory Visit: Admitting: Neurology

## 2024-02-22 DIAGNOSIS — J301 Allergic rhinitis due to pollen: Secondary | ICD-10-CM | POA: Diagnosis not present

## 2024-02-22 DIAGNOSIS — J3081 Allergic rhinitis due to animal (cat) (dog) hair and dander: Secondary | ICD-10-CM | POA: Diagnosis not present

## 2024-02-22 DIAGNOSIS — J3089 Other allergic rhinitis: Secondary | ICD-10-CM | POA: Diagnosis not present

## 2024-02-28 DIAGNOSIS — J3089 Other allergic rhinitis: Secondary | ICD-10-CM | POA: Diagnosis not present

## 2024-02-28 DIAGNOSIS — J3081 Allergic rhinitis due to animal (cat) (dog) hair and dander: Secondary | ICD-10-CM | POA: Diagnosis not present

## 2024-02-28 DIAGNOSIS — J301 Allergic rhinitis due to pollen: Secondary | ICD-10-CM | POA: Diagnosis not present

## 2024-03-07 DIAGNOSIS — J301 Allergic rhinitis due to pollen: Secondary | ICD-10-CM | POA: Diagnosis not present

## 2024-03-07 DIAGNOSIS — J3089 Other allergic rhinitis: Secondary | ICD-10-CM | POA: Diagnosis not present

## 2024-03-07 DIAGNOSIS — J3081 Allergic rhinitis due to animal (cat) (dog) hair and dander: Secondary | ICD-10-CM | POA: Diagnosis not present

## 2024-03-13 DIAGNOSIS — J3081 Allergic rhinitis due to animal (cat) (dog) hair and dander: Secondary | ICD-10-CM | POA: Diagnosis not present

## 2024-03-13 DIAGNOSIS — J301 Allergic rhinitis due to pollen: Secondary | ICD-10-CM | POA: Diagnosis not present

## 2024-03-13 DIAGNOSIS — J3089 Other allergic rhinitis: Secondary | ICD-10-CM | POA: Diagnosis not present

## 2024-03-21 DIAGNOSIS — J3089 Other allergic rhinitis: Secondary | ICD-10-CM | POA: Diagnosis not present

## 2024-03-21 DIAGNOSIS — J301 Allergic rhinitis due to pollen: Secondary | ICD-10-CM | POA: Diagnosis not present

## 2024-03-21 DIAGNOSIS — J3081 Allergic rhinitis due to animal (cat) (dog) hair and dander: Secondary | ICD-10-CM | POA: Diagnosis not present

## 2024-03-26 DIAGNOSIS — J3081 Allergic rhinitis due to animal (cat) (dog) hair and dander: Secondary | ICD-10-CM | POA: Diagnosis not present

## 2024-03-26 DIAGNOSIS — J301 Allergic rhinitis due to pollen: Secondary | ICD-10-CM | POA: Diagnosis not present

## 2024-03-26 DIAGNOSIS — Z23 Encounter for immunization: Secondary | ICD-10-CM | POA: Diagnosis not present

## 2024-03-26 DIAGNOSIS — J3089 Other allergic rhinitis: Secondary | ICD-10-CM | POA: Diagnosis not present

## 2024-03-27 DIAGNOSIS — J3089 Other allergic rhinitis: Secondary | ICD-10-CM | POA: Diagnosis not present

## 2024-04-03 ENCOUNTER — Telehealth: Payer: Self-pay

## 2024-04-03 DIAGNOSIS — J3089 Other allergic rhinitis: Secondary | ICD-10-CM | POA: Diagnosis not present

## 2024-04-03 DIAGNOSIS — J3081 Allergic rhinitis due to animal (cat) (dog) hair and dander: Secondary | ICD-10-CM | POA: Diagnosis not present

## 2024-04-03 DIAGNOSIS — J301 Allergic rhinitis due to pollen: Secondary | ICD-10-CM | POA: Diagnosis not present

## 2024-04-03 NOTE — Telephone Encounter (Signed)
-----   Message from Rollo ONEIDA Bring sent at 04/02/2024  3:11 PM EST ----- Did she see her PCP? She was supposed to see her PCP about optimizing her blood pressure. And if she has an up to date lipid panel, to send this as well. Her last lipid panel I have is from 2024 ----- Message ----- From: Venus Erminio LABOR, RN Sent: 04/02/2024   2:31 PM EST To: Rollo ONEIDA Bring, MD  Ms. Cobaugh called regarding extreme hot flashes and night sweats.  She said you had recommended she not take the Estradiol  due to cardiac risks.  Is there anything else she can take?  She is willing to try anything over the counter.  Erminio LABOR Venus

## 2024-04-03 NOTE — Telephone Encounter (Signed)
 Per DPR, left message regarding Dr. Bolivar response.  Asked patient to return call with information needed.  Grace Gregory

## 2024-04-04 ENCOUNTER — Other Ambulatory Visit: Payer: Self-pay | Admitting: Obstetrics & Gynecology

## 2024-04-04 ENCOUNTER — Telehealth: Payer: Self-pay

## 2024-04-04 DIAGNOSIS — E2839 Other primary ovarian failure: Secondary | ICD-10-CM

## 2024-04-04 NOTE — Telephone Encounter (Signed)
 Spoke with patient informing her that there are no OTC options, Dr. Abigail will discuss non hormonal options.  Will set up virtual appt.  Erminio DELENA Rumps, RN

## 2024-04-04 NOTE — Telephone Encounter (Signed)
-----   Message from Rollo ONEIDA Bring sent at 04/03/2024  5:40 PM EST ----- We can discuss nonhormonal options, there isn't really anything OTC ----- Message ----- From: Venus Erminio LABOR, RN Sent: 04/03/2024   1:43 PM EST To: Rollo ONEIDA Bring, MD  Patient called back regarding your questions.  She does have an appt with her PCP.  She will have the evaluate BP and find out about Lipid panel.  She was very tearful on the phone.  She is not wanting to restart estradiol  but wants to know if there is anything OTC she can take.  Erminio LABOR Venus

## 2024-04-04 NOTE — Telephone Encounter (Signed)
 Spoke with patient and got her scheduled for a virtual visit with Dr. Abigail on 12/8. Informed patient that I would put her on the waitlist and will notify her of any cancellations.

## 2024-04-10 DIAGNOSIS — R200 Anesthesia of skin: Secondary | ICD-10-CM | POA: Diagnosis not present

## 2024-04-10 DIAGNOSIS — J301 Allergic rhinitis due to pollen: Secondary | ICD-10-CM | POA: Diagnosis not present

## 2024-04-10 DIAGNOSIS — J3081 Allergic rhinitis due to animal (cat) (dog) hair and dander: Secondary | ICD-10-CM | POA: Diagnosis not present

## 2024-04-10 DIAGNOSIS — R32 Unspecified urinary incontinence: Secondary | ICD-10-CM | POA: Diagnosis not present

## 2024-04-10 DIAGNOSIS — M542 Cervicalgia: Secondary | ICD-10-CM | POA: Diagnosis not present

## 2024-04-10 DIAGNOSIS — J3089 Other allergic rhinitis: Secondary | ICD-10-CM | POA: Diagnosis not present

## 2024-04-15 ENCOUNTER — Other Ambulatory Visit (HOSPITAL_COMMUNITY): Payer: Self-pay

## 2024-04-15 DIAGNOSIS — J3081 Allergic rhinitis due to animal (cat) (dog) hair and dander: Secondary | ICD-10-CM | POA: Diagnosis not present

## 2024-04-15 DIAGNOSIS — J301 Allergic rhinitis due to pollen: Secondary | ICD-10-CM | POA: Diagnosis not present

## 2024-04-15 DIAGNOSIS — M542 Cervicalgia: Secondary | ICD-10-CM

## 2024-04-15 DIAGNOSIS — M5030 Other cervical disc degeneration, unspecified cervical region: Secondary | ICD-10-CM | POA: Diagnosis not present

## 2024-04-15 DIAGNOSIS — M62830 Muscle spasm of back: Secondary | ICD-10-CM | POA: Diagnosis not present

## 2024-04-15 DIAGNOSIS — M5412 Radiculopathy, cervical region: Secondary | ICD-10-CM | POA: Diagnosis not present

## 2024-04-15 DIAGNOSIS — J3089 Other allergic rhinitis: Secondary | ICD-10-CM | POA: Diagnosis not present

## 2024-04-15 DIAGNOSIS — R202 Paresthesia of skin: Secondary | ICD-10-CM | POA: Diagnosis not present

## 2024-04-24 DIAGNOSIS — J3089 Other allergic rhinitis: Secondary | ICD-10-CM | POA: Diagnosis not present

## 2024-04-24 DIAGNOSIS — J3081 Allergic rhinitis due to animal (cat) (dog) hair and dander: Secondary | ICD-10-CM | POA: Diagnosis not present

## 2024-04-24 DIAGNOSIS — J301 Allergic rhinitis due to pollen: Secondary | ICD-10-CM | POA: Diagnosis not present

## 2024-04-25 ENCOUNTER — Encounter (HOSPITAL_BASED_OUTPATIENT_CLINIC_OR_DEPARTMENT_OTHER): Payer: Self-pay

## 2024-04-25 ENCOUNTER — Ambulatory Visit (HOSPITAL_BASED_OUTPATIENT_CLINIC_OR_DEPARTMENT_OTHER)

## 2024-04-28 ENCOUNTER — Telehealth: Admitting: Obstetrics and Gynecology

## 2024-05-01 DIAGNOSIS — J301 Allergic rhinitis due to pollen: Secondary | ICD-10-CM | POA: Diagnosis not present

## 2024-05-01 DIAGNOSIS — J3089 Other allergic rhinitis: Secondary | ICD-10-CM | POA: Diagnosis not present

## 2024-05-01 DIAGNOSIS — J3081 Allergic rhinitis due to animal (cat) (dog) hair and dander: Secondary | ICD-10-CM | POA: Diagnosis not present

## 2024-05-05 DIAGNOSIS — J3089 Other allergic rhinitis: Secondary | ICD-10-CM | POA: Diagnosis not present

## 2024-05-05 DIAGNOSIS — Z0000 Encounter for general adult medical examination without abnormal findings: Secondary | ICD-10-CM | POA: Diagnosis not present

## 2024-05-05 DIAGNOSIS — E119 Type 2 diabetes mellitus without complications: Secondary | ICD-10-CM | POA: Diagnosis not present

## 2024-05-05 DIAGNOSIS — J3081 Allergic rhinitis due to animal (cat) (dog) hair and dander: Secondary | ICD-10-CM | POA: Diagnosis not present

## 2024-05-05 DIAGNOSIS — E785 Hyperlipidemia, unspecified: Secondary | ICD-10-CM | POA: Diagnosis not present

## 2024-05-05 DIAGNOSIS — J301 Allergic rhinitis due to pollen: Secondary | ICD-10-CM | POA: Diagnosis not present

## 2024-05-07 DIAGNOSIS — E119 Type 2 diabetes mellitus without complications: Secondary | ICD-10-CM | POA: Diagnosis not present

## 2024-05-07 DIAGNOSIS — M2550 Pain in unspecified joint: Secondary | ICD-10-CM | POA: Diagnosis not present

## 2024-05-07 DIAGNOSIS — Z0000 Encounter for general adult medical examination without abnormal findings: Secondary | ICD-10-CM | POA: Diagnosis not present

## 2024-05-07 DIAGNOSIS — E785 Hyperlipidemia, unspecified: Secondary | ICD-10-CM | POA: Diagnosis not present

## 2024-05-07 DIAGNOSIS — I10 Essential (primary) hypertension: Secondary | ICD-10-CM | POA: Diagnosis not present

## 2024-05-12 DIAGNOSIS — J301 Allergic rhinitis due to pollen: Secondary | ICD-10-CM | POA: Diagnosis not present

## 2024-05-12 DIAGNOSIS — J3089 Other allergic rhinitis: Secondary | ICD-10-CM | POA: Diagnosis not present

## 2024-05-12 DIAGNOSIS — J3081 Allergic rhinitis due to animal (cat) (dog) hair and dander: Secondary | ICD-10-CM | POA: Diagnosis not present

## 2024-05-21 DIAGNOSIS — J301 Allergic rhinitis due to pollen: Secondary | ICD-10-CM | POA: Diagnosis not present

## 2024-05-21 DIAGNOSIS — J3089 Other allergic rhinitis: Secondary | ICD-10-CM | POA: Diagnosis not present

## 2024-05-21 DIAGNOSIS — J3081 Allergic rhinitis due to animal (cat) (dog) hair and dander: Secondary | ICD-10-CM | POA: Diagnosis not present

## 2024-07-14 ENCOUNTER — Ambulatory Visit
